# Patient Record
Sex: Female | Born: 1940
Health system: Southern US, Community
[De-identification: ages and names within clinical notes are randomized; demographics above are authoritative.]

## PROBLEM LIST (undated history)

## (undated) DIAGNOSIS — I10 Essential (primary) hypertension: Secondary | ICD-10-CM

## (undated) DIAGNOSIS — M1990 Unspecified osteoarthritis, unspecified site: Secondary | ICD-10-CM

## (undated) DIAGNOSIS — Z923 Personal history of irradiation: Secondary | ICD-10-CM

## (undated) DIAGNOSIS — C50919 Malignant neoplasm of unspecified site of unspecified female breast: Secondary | ICD-10-CM

## (undated) DIAGNOSIS — M8580 Other specified disorders of bone density and structure, unspecified site: Secondary | ICD-10-CM

## (undated) DIAGNOSIS — F419 Anxiety disorder, unspecified: Secondary | ICD-10-CM

## (undated) DIAGNOSIS — I4891 Unspecified atrial fibrillation: Secondary | ICD-10-CM

## (undated) DIAGNOSIS — E7800 Pure hypercholesterolemia, unspecified: Secondary | ICD-10-CM

## (undated) DIAGNOSIS — T7840XA Allergy, unspecified, initial encounter: Secondary | ICD-10-CM

## (undated) HISTORY — PX: OTHER SURGICAL HISTORY: SHX169

## (undated) HISTORY — DX: Malignant neoplasm of unspecified site of unspecified female breast: C50.919

## (undated) HISTORY — PX: TONSILLECTOMY: SUR1361

## (undated) HISTORY — PX: APPENDECTOMY: SHX54

## (undated) HISTORY — PX: CHOLECYSTECTOMY: SHX55

## (undated) HISTORY — DX: Unspecified atrial fibrillation: I48.91

---

## 1998-02-03 ENCOUNTER — Ambulatory Visit (HOSPITAL_COMMUNITY): Admission: RE | Admit: 1998-02-03 | Discharge: 1998-02-03 | Payer: Self-pay | Admitting: *Deleted

## 1999-02-06 ENCOUNTER — Ambulatory Visit (HOSPITAL_COMMUNITY): Admission: RE | Admit: 1999-02-06 | Discharge: 1999-02-06 | Payer: Self-pay | Admitting: *Deleted

## 1999-08-03 ENCOUNTER — Other Ambulatory Visit: Admission: RE | Admit: 1999-08-03 | Discharge: 1999-08-03 | Payer: Self-pay | Admitting: *Deleted

## 1999-08-04 ENCOUNTER — Encounter (INDEPENDENT_AMBULATORY_CARE_PROVIDER_SITE_OTHER): Payer: Self-pay | Admitting: Specialist

## 1999-08-04 ENCOUNTER — Other Ambulatory Visit: Admission: RE | Admit: 1999-08-04 | Discharge: 1999-08-04 | Payer: Self-pay | Admitting: *Deleted

## 2000-02-08 ENCOUNTER — Encounter: Admission: RE | Admit: 2000-02-08 | Discharge: 2000-02-08 | Payer: Self-pay | Admitting: *Deleted

## 2000-02-08 ENCOUNTER — Encounter: Payer: Self-pay | Admitting: *Deleted

## 2000-08-02 ENCOUNTER — Other Ambulatory Visit: Admission: RE | Admit: 2000-08-02 | Discharge: 2000-08-02 | Payer: Self-pay | Admitting: *Deleted

## 2001-02-07 ENCOUNTER — Ambulatory Visit (HOSPITAL_COMMUNITY): Admission: RE | Admit: 2001-02-07 | Discharge: 2001-02-07 | Payer: Self-pay | Admitting: Gastroenterology

## 2001-02-14 ENCOUNTER — Encounter: Payer: Self-pay | Admitting: *Deleted

## 2001-02-14 ENCOUNTER — Encounter: Admission: RE | Admit: 2001-02-14 | Discharge: 2001-02-14 | Payer: Self-pay | Admitting: *Deleted

## 2001-08-04 ENCOUNTER — Other Ambulatory Visit: Admission: RE | Admit: 2001-08-04 | Discharge: 2001-08-04 | Payer: Self-pay | Admitting: *Deleted

## 2001-08-08 ENCOUNTER — Encounter: Admission: RE | Admit: 2001-08-08 | Discharge: 2001-08-08 | Payer: Self-pay | Admitting: *Deleted

## 2001-08-08 ENCOUNTER — Encounter: Payer: Self-pay | Admitting: *Deleted

## 2002-02-16 ENCOUNTER — Encounter: Admission: RE | Admit: 2002-02-16 | Discharge: 2002-02-16 | Payer: Self-pay | Admitting: *Deleted

## 2002-02-16 ENCOUNTER — Encounter: Payer: Self-pay | Admitting: *Deleted

## 2002-08-06 ENCOUNTER — Other Ambulatory Visit: Admission: RE | Admit: 2002-08-06 | Discharge: 2002-08-06 | Payer: Self-pay | Admitting: Obstetrics and Gynecology

## 2002-11-30 ENCOUNTER — Encounter (HOSPITAL_BASED_OUTPATIENT_CLINIC_OR_DEPARTMENT_OTHER): Payer: Self-pay | Admitting: Internal Medicine

## 2002-11-30 ENCOUNTER — Ambulatory Visit (HOSPITAL_COMMUNITY): Admission: RE | Admit: 2002-11-30 | Discharge: 2002-11-30 | Payer: Self-pay | Admitting: Internal Medicine

## 2003-02-19 ENCOUNTER — Encounter: Payer: Self-pay | Admitting: Obstetrics and Gynecology

## 2003-02-19 ENCOUNTER — Encounter: Admission: RE | Admit: 2003-02-19 | Discharge: 2003-02-19 | Payer: Self-pay | Admitting: Obstetrics and Gynecology

## 2003-08-08 ENCOUNTER — Other Ambulatory Visit: Admission: RE | Admit: 2003-08-08 | Discharge: 2003-08-08 | Payer: Self-pay | Admitting: Obstetrics and Gynecology

## 2004-03-03 ENCOUNTER — Encounter: Admission: RE | Admit: 2004-03-03 | Discharge: 2004-03-03 | Payer: Self-pay | Admitting: Obstetrics and Gynecology

## 2005-03-23 ENCOUNTER — Encounter: Admission: RE | Admit: 2005-03-23 | Discharge: 2005-03-23 | Payer: Self-pay | Admitting: Obstetrics and Gynecology

## 2006-03-28 ENCOUNTER — Encounter: Admission: RE | Admit: 2006-03-28 | Discharge: 2006-03-28 | Payer: Self-pay | Admitting: Obstetrics and Gynecology

## 2007-03-30 ENCOUNTER — Encounter: Admission: RE | Admit: 2007-03-30 | Discharge: 2007-03-30 | Payer: Self-pay | Admitting: Obstetrics and Gynecology

## 2008-04-04 ENCOUNTER — Encounter: Admission: RE | Admit: 2008-04-04 | Discharge: 2008-04-04 | Payer: Self-pay | Admitting: Obstetrics and Gynecology

## 2008-11-01 ENCOUNTER — Ambulatory Visit (HOSPITAL_COMMUNITY): Admission: RE | Admit: 2008-11-01 | Discharge: 2008-11-01 | Payer: Self-pay | Admitting: Gastroenterology

## 2009-05-01 ENCOUNTER — Encounter: Admission: RE | Admit: 2009-05-01 | Discharge: 2009-05-01 | Payer: Self-pay | Admitting: Obstetrics and Gynecology

## 2010-05-14 ENCOUNTER — Encounter: Admission: RE | Admit: 2010-05-14 | Discharge: 2010-05-14 | Payer: Self-pay | Admitting: Obstetrics and Gynecology

## 2011-04-12 ENCOUNTER — Other Ambulatory Visit: Payer: Self-pay | Admitting: Obstetrics and Gynecology

## 2011-04-14 ENCOUNTER — Other Ambulatory Visit: Payer: Self-pay | Admitting: Obstetrics and Gynecology

## 2011-04-14 DIAGNOSIS — M8580 Other specified disorders of bone density and structure, unspecified site: Secondary | ICD-10-CM

## 2011-04-14 DIAGNOSIS — Z1231 Encounter for screening mammogram for malignant neoplasm of breast: Secondary | ICD-10-CM

## 2011-05-20 ENCOUNTER — Ambulatory Visit
Admission: RE | Admit: 2011-05-20 | Discharge: 2011-05-20 | Disposition: A | Discharge status: Home / Self Care | Payer: Medicare Other | Source: Ambulatory Visit | Attending: Obstetrics and Gynecology | Admitting: Obstetrics and Gynecology

## 2011-05-20 DIAGNOSIS — Z1231 Encounter for screening mammogram for malignant neoplasm of breast: Secondary | ICD-10-CM

## 2011-05-20 DIAGNOSIS — M8580 Other specified disorders of bone density and structure, unspecified site: Secondary | ICD-10-CM

## 2012-04-10 ENCOUNTER — Other Ambulatory Visit: Payer: Self-pay | Admitting: Obstetrics and Gynecology

## 2012-04-10 DIAGNOSIS — Z1231 Encounter for screening mammogram for malignant neoplasm of breast: Secondary | ICD-10-CM

## 2012-05-22 ENCOUNTER — Ambulatory Visit
Admission: RE | Admit: 2012-05-22 | Discharge: 2012-05-22 | Disposition: A | Discharge status: Home / Self Care | Payer: Medicare Other | Source: Ambulatory Visit | Attending: Obstetrics and Gynecology | Admitting: Obstetrics and Gynecology

## 2012-05-22 DIAGNOSIS — Z1231 Encounter for screening mammogram for malignant neoplasm of breast: Secondary | ICD-10-CM

## 2013-04-23 ENCOUNTER — Other Ambulatory Visit: Payer: Self-pay

## 2013-04-23 ENCOUNTER — Other Ambulatory Visit: Payer: Self-pay | Admitting: Obstetrics & Gynecology

## 2013-04-23 DIAGNOSIS — R29890 Loss of height: Secondary | ICD-10-CM

## 2013-04-23 DIAGNOSIS — Z1231 Encounter for screening mammogram for malignant neoplasm of breast: Secondary | ICD-10-CM

## 2013-04-23 DIAGNOSIS — M8580 Other specified disorders of bone density and structure, unspecified site: Secondary | ICD-10-CM

## 2013-05-23 ENCOUNTER — Ambulatory Visit: Payer: Medicare Other

## 2013-05-31 ENCOUNTER — Ambulatory Visit
Admission: RE | Admit: 2013-05-31 | Discharge: 2013-05-31 | Disposition: A | Discharge status: Home / Self Care | Payer: Medicare Other | Source: Ambulatory Visit | Attending: Obstetrics & Gynecology | Admitting: Obstetrics & Gynecology

## 2013-05-31 ENCOUNTER — Ambulatory Visit
Admission: RE | Admit: 2013-05-31 | Discharge: 2013-05-31 | Disposition: A | Discharge status: Home / Self Care | Payer: Medicare Other | Source: Ambulatory Visit

## 2013-05-31 DIAGNOSIS — Z1231 Encounter for screening mammogram for malignant neoplasm of breast: Secondary | ICD-10-CM

## 2013-05-31 DIAGNOSIS — M8580 Other specified disorders of bone density and structure, unspecified site: Secondary | ICD-10-CM

## 2013-05-31 DIAGNOSIS — R29890 Loss of height: Secondary | ICD-10-CM

## 2013-06-04 ENCOUNTER — Other Ambulatory Visit: Payer: Self-pay | Admitting: Obstetrics and Gynecology

## 2013-06-04 DIAGNOSIS — R928 Other abnormal and inconclusive findings on diagnostic imaging of breast: Secondary | ICD-10-CM

## 2013-06-21 ENCOUNTER — Ambulatory Visit
Admission: RE | Admit: 2013-06-21 | Discharge: 2013-06-21 | Disposition: A | Discharge status: Home / Self Care | Payer: Medicare Other | Source: Ambulatory Visit | Attending: Obstetrics and Gynecology | Admitting: Obstetrics and Gynecology

## 2013-06-21 DIAGNOSIS — R928 Other abnormal and inconclusive findings on diagnostic imaging of breast: Secondary | ICD-10-CM

## 2014-05-20 ENCOUNTER — Other Ambulatory Visit: Payer: Self-pay

## 2014-05-20 DIAGNOSIS — Z1231 Encounter for screening mammogram for malignant neoplasm of breast: Secondary | ICD-10-CM

## 2014-06-05 ENCOUNTER — Encounter (INDEPENDENT_AMBULATORY_CARE_PROVIDER_SITE_OTHER): Payer: Self-pay

## 2014-06-05 ENCOUNTER — Ambulatory Visit
Admission: RE | Admit: 2014-06-05 | Discharge: 2014-06-05 | Disposition: A | Discharge status: Home / Self Care | Payer: Commercial Managed Care - HMO | Source: Ambulatory Visit

## 2014-06-05 DIAGNOSIS — Z1231 Encounter for screening mammogram for malignant neoplasm of breast: Secondary | ICD-10-CM

## 2015-05-02 ENCOUNTER — Other Ambulatory Visit: Payer: Self-pay

## 2015-05-02 DIAGNOSIS — Z1231 Encounter for screening mammogram for malignant neoplasm of breast: Secondary | ICD-10-CM

## 2015-05-15 ENCOUNTER — Other Ambulatory Visit: Payer: Self-pay | Admitting: Obstetrics and Gynecology

## 2015-05-15 DIAGNOSIS — M8580 Other specified disorders of bone density and structure, unspecified site: Secondary | ICD-10-CM

## 2015-06-16 ENCOUNTER — Ambulatory Visit: Payer: Commercial Managed Care - HMO

## 2015-06-18 ENCOUNTER — Other Ambulatory Visit: Payer: Commercial Managed Care - HMO

## 2015-06-26 ENCOUNTER — Ambulatory Visit
Admission: RE | Admit: 2015-06-26 | Discharge: 2015-06-26 | Disposition: A | Discharge status: Home / Self Care | Payer: Commercial Managed Care - HMO | Source: Ambulatory Visit | Attending: Obstetrics and Gynecology | Admitting: Obstetrics and Gynecology

## 2015-06-26 ENCOUNTER — Ambulatory Visit
Admission: RE | Admit: 2015-06-26 | Discharge: 2015-06-26 | Disposition: A | Discharge status: Home / Self Care | Payer: Commercial Managed Care - HMO | Source: Ambulatory Visit

## 2015-06-26 DIAGNOSIS — Z1231 Encounter for screening mammogram for malignant neoplasm of breast: Secondary | ICD-10-CM

## 2015-06-26 DIAGNOSIS — M8580 Other specified disorders of bone density and structure, unspecified site: Secondary | ICD-10-CM

## 2015-08-28 DIAGNOSIS — H35371 Puckering of macula, right eye: Secondary | ICD-10-CM | POA: Diagnosis not present

## 2015-09-05 DIAGNOSIS — I10 Essential (primary) hypertension: Secondary | ICD-10-CM | POA: Diagnosis not present

## 2015-09-05 DIAGNOSIS — E784 Other hyperlipidemia: Secondary | ICD-10-CM | POA: Diagnosis not present

## 2015-09-05 DIAGNOSIS — E559 Vitamin D deficiency, unspecified: Secondary | ICD-10-CM | POA: Diagnosis not present

## 2015-09-15 DIAGNOSIS — Z6823 Body mass index (BMI) 23.0-23.9, adult: Secondary | ICD-10-CM | POA: Diagnosis not present

## 2015-09-15 DIAGNOSIS — L989 Disorder of the skin and subcutaneous tissue, unspecified: Secondary | ICD-10-CM | POA: Diagnosis not present

## 2015-09-15 DIAGNOSIS — F418 Other specified anxiety disorders: Secondary | ICD-10-CM | POA: Diagnosis not present

## 2015-09-15 DIAGNOSIS — Z1389 Encounter for screening for other disorder: Secondary | ICD-10-CM | POA: Diagnosis not present

## 2015-09-15 DIAGNOSIS — J45909 Unspecified asthma, uncomplicated: Secondary | ICD-10-CM | POA: Diagnosis not present

## 2015-09-15 DIAGNOSIS — E559 Vitamin D deficiency, unspecified: Secondary | ICD-10-CM | POA: Diagnosis not present

## 2015-09-15 DIAGNOSIS — M859 Disorder of bone density and structure, unspecified: Secondary | ICD-10-CM | POA: Diagnosis not present

## 2015-09-15 DIAGNOSIS — I10 Essential (primary) hypertension: Secondary | ICD-10-CM | POA: Diagnosis not present

## 2015-09-15 DIAGNOSIS — Z0000 Encounter for general adult medical examination without abnormal findings: Secondary | ICD-10-CM | POA: Diagnosis not present

## 2015-09-18 DIAGNOSIS — Z1212 Encounter for screening for malignant neoplasm of rectum: Secondary | ICD-10-CM | POA: Diagnosis not present

## 2015-10-02 DIAGNOSIS — L821 Other seborrheic keratosis: Secondary | ICD-10-CM | POA: Diagnosis not present

## 2015-10-02 DIAGNOSIS — D485 Neoplasm of uncertain behavior of skin: Secondary | ICD-10-CM | POA: Diagnosis not present

## 2016-06-28 DIAGNOSIS — Z01419 Encounter for gynecological examination (general) (routine) without abnormal findings: Secondary | ICD-10-CM | POA: Diagnosis not present

## 2016-06-28 DIAGNOSIS — Z1231 Encounter for screening mammogram for malignant neoplasm of breast: Secondary | ICD-10-CM | POA: Diagnosis not present

## 2016-08-30 DIAGNOSIS — H1045 Other chronic allergic conjunctivitis: Secondary | ICD-10-CM | POA: Diagnosis not present

## 2016-08-30 DIAGNOSIS — H35371 Puckering of macula, right eye: Secondary | ICD-10-CM | POA: Diagnosis not present

## 2016-09-01 DIAGNOSIS — J45909 Unspecified asthma, uncomplicated: Secondary | ICD-10-CM | POA: Diagnosis not present

## 2016-09-01 DIAGNOSIS — H9191 Unspecified hearing loss, right ear: Secondary | ICD-10-CM | POA: Diagnosis not present

## 2016-09-01 DIAGNOSIS — Z6823 Body mass index (BMI) 23.0-23.9, adult: Secondary | ICD-10-CM | POA: Diagnosis not present

## 2016-09-01 DIAGNOSIS — I10 Essential (primary) hypertension: Secondary | ICD-10-CM | POA: Diagnosis not present

## 2016-09-01 DIAGNOSIS — H6121 Impacted cerumen, right ear: Secondary | ICD-10-CM | POA: Diagnosis not present

## 2016-09-20 DIAGNOSIS — E784 Other hyperlipidemia: Secondary | ICD-10-CM | POA: Diagnosis not present

## 2016-09-20 DIAGNOSIS — E559 Vitamin D deficiency, unspecified: Secondary | ICD-10-CM | POA: Diagnosis not present

## 2016-09-20 DIAGNOSIS — I10 Essential (primary) hypertension: Secondary | ICD-10-CM | POA: Diagnosis not present

## 2016-10-04 DIAGNOSIS — E559 Vitamin D deficiency, unspecified: Secondary | ICD-10-CM | POA: Diagnosis not present

## 2016-10-04 DIAGNOSIS — Z1389 Encounter for screening for other disorder: Secondary | ICD-10-CM | POA: Diagnosis not present

## 2016-10-04 DIAGNOSIS — E784 Other hyperlipidemia: Secondary | ICD-10-CM | POA: Diagnosis not present

## 2016-10-04 DIAGNOSIS — I10 Essential (primary) hypertension: Secondary | ICD-10-CM | POA: Diagnosis not present

## 2016-10-04 DIAGNOSIS — F418 Other specified anxiety disorders: Secondary | ICD-10-CM | POA: Diagnosis not present

## 2016-10-04 DIAGNOSIS — G4709 Other insomnia: Secondary | ICD-10-CM | POA: Diagnosis not present

## 2016-10-04 DIAGNOSIS — Z0000 Encounter for general adult medical examination without abnormal findings: Secondary | ICD-10-CM | POA: Diagnosis not present

## 2016-10-04 DIAGNOSIS — Z6823 Body mass index (BMI) 23.0-23.9, adult: Secondary | ICD-10-CM | POA: Diagnosis not present

## 2016-10-04 DIAGNOSIS — J45998 Other asthma: Secondary | ICD-10-CM | POA: Diagnosis not present

## 2016-10-04 DIAGNOSIS — M859 Disorder of bone density and structure, unspecified: Secondary | ICD-10-CM | POA: Diagnosis not present

## 2016-10-05 DIAGNOSIS — Z1212 Encounter for screening for malignant neoplasm of rectum: Secondary | ICD-10-CM | POA: Diagnosis not present

## 2017-02-28 DIAGNOSIS — H35371 Puckering of macula, right eye: Secondary | ICD-10-CM | POA: Diagnosis not present

## 2017-05-13 DIAGNOSIS — Z800 Family history of malignant neoplasm of digestive organs: Secondary | ICD-10-CM | POA: Diagnosis not present

## 2017-05-13 DIAGNOSIS — K5730 Diverticulosis of large intestine without perforation or abscess without bleeding: Secondary | ICD-10-CM | POA: Diagnosis not present

## 2017-05-13 DIAGNOSIS — Z1211 Encounter for screening for malignant neoplasm of colon: Secondary | ICD-10-CM | POA: Diagnosis not present

## 2017-06-30 DIAGNOSIS — M8580 Other specified disorders of bone density and structure, unspecified site: Secondary | ICD-10-CM | POA: Diagnosis not present

## 2017-06-30 DIAGNOSIS — Z124 Encounter for screening for malignant neoplasm of cervix: Secondary | ICD-10-CM | POA: Diagnosis not present

## 2017-06-30 DIAGNOSIS — Z1231 Encounter for screening mammogram for malignant neoplasm of breast: Secondary | ICD-10-CM | POA: Diagnosis not present

## 2017-09-05 DIAGNOSIS — H524 Presbyopia: Secondary | ICD-10-CM | POA: Diagnosis not present

## 2017-09-05 DIAGNOSIS — H04123 Dry eye syndrome of bilateral lacrimal glands: Secondary | ICD-10-CM | POA: Diagnosis not present

## 2017-09-05 DIAGNOSIS — Z961 Presence of intraocular lens: Secondary | ICD-10-CM | POA: Diagnosis not present

## 2017-10-03 DIAGNOSIS — R82998 Other abnormal findings in urine: Secondary | ICD-10-CM | POA: Diagnosis not present

## 2017-10-03 DIAGNOSIS — E559 Vitamin D deficiency, unspecified: Secondary | ICD-10-CM | POA: Diagnosis not present

## 2017-10-03 DIAGNOSIS — E7849 Other hyperlipidemia: Secondary | ICD-10-CM | POA: Diagnosis not present

## 2017-10-03 DIAGNOSIS — I10 Essential (primary) hypertension: Secondary | ICD-10-CM | POA: Diagnosis not present

## 2017-10-10 DIAGNOSIS — E7849 Other hyperlipidemia: Secondary | ICD-10-CM | POA: Diagnosis not present

## 2017-10-10 DIAGNOSIS — Z0000 Encounter for general adult medical examination without abnormal findings: Secondary | ICD-10-CM | POA: Diagnosis not present

## 2017-10-10 DIAGNOSIS — I10 Essential (primary) hypertension: Secondary | ICD-10-CM | POA: Diagnosis not present

## 2017-10-10 DIAGNOSIS — F418 Other specified anxiety disorders: Secondary | ICD-10-CM | POA: Diagnosis not present

## 2017-10-10 DIAGNOSIS — Z1389 Encounter for screening for other disorder: Secondary | ICD-10-CM | POA: Diagnosis not present

## 2017-10-10 DIAGNOSIS — M859 Disorder of bone density and structure, unspecified: Secondary | ICD-10-CM | POA: Diagnosis not present

## 2017-10-10 DIAGNOSIS — Z6822 Body mass index (BMI) 22.0-22.9, adult: Secondary | ICD-10-CM | POA: Diagnosis not present

## 2017-10-10 DIAGNOSIS — E559 Vitamin D deficiency, unspecified: Secondary | ICD-10-CM | POA: Diagnosis not present

## 2017-10-10 DIAGNOSIS — J45998 Other asthma: Secondary | ICD-10-CM | POA: Diagnosis not present

## 2017-10-10 DIAGNOSIS — L988 Other specified disorders of the skin and subcutaneous tissue: Secondary | ICD-10-CM | POA: Diagnosis not present

## 2017-10-14 DIAGNOSIS — Z1212 Encounter for screening for malignant neoplasm of rectum: Secondary | ICD-10-CM | POA: Diagnosis not present

## 2017-11-10 DIAGNOSIS — L570 Actinic keratosis: Secondary | ICD-10-CM | POA: Diagnosis not present

## 2017-11-10 DIAGNOSIS — C44311 Basal cell carcinoma of skin of nose: Secondary | ICD-10-CM | POA: Diagnosis not present

## 2017-11-10 DIAGNOSIS — C44319 Basal cell carcinoma of skin of other parts of face: Secondary | ICD-10-CM | POA: Diagnosis not present

## 2017-11-10 DIAGNOSIS — D485 Neoplasm of uncertain behavior of skin: Secondary | ICD-10-CM | POA: Diagnosis not present

## 2017-11-21 DIAGNOSIS — C4401 Basal cell carcinoma of skin of lip: Secondary | ICD-10-CM | POA: Diagnosis not present

## 2017-11-21 DIAGNOSIS — Z85828 Personal history of other malignant neoplasm of skin: Secondary | ICD-10-CM | POA: Diagnosis not present

## 2018-02-24 DIAGNOSIS — Z85828 Personal history of other malignant neoplasm of skin: Secondary | ICD-10-CM | POA: Diagnosis not present

## 2018-02-24 DIAGNOSIS — L720 Epidermal cyst: Secondary | ICD-10-CM | POA: Diagnosis not present

## 2018-04-07 ENCOUNTER — Other Ambulatory Visit: Payer: Self-pay | Admitting: Pharmacist

## 2018-04-07 NOTE — Patient Outreach (Signed)
[place]) Care Management  04/07/2018  KENZI BARDWELL 1940-08-17 [id number]   Call patient's PCP office to confirm that provider is aware that patient is taking and wants patient to continue to take a total of 10 mg of atorvastatin once daily. If so, request that provider send an updated prescription reflecting this dosing into the patient's pharmacy. Leave a message for the provider.  Harlow Asa, PharmD, [place] Management [phone]

## 2018-04-07 NOTE — Patient Outreach (Signed)
[place] [facility]) Care Management  04/07/2018  Kelly Blackwell Apr 12, 1941 [id number]   Incoming call from [name] in response to the [facility] Medication Adherence Campaign. Speak with patient. HIPAA identifiers verified and verbal consent received.  [name] reports that she takes her atorvastatin 20 mg - 1/2 tablet by mouth daily as directed. Patient notes that her prescription from her PCP is written for her to take a full tablet once daily, but states that her provider has instructed her to take just 1/2 tablet daily. Patient is not aware that a 10 mg strength exists. Let patient know that I will call her PCP to request that he update this prescription to accurately reflect the dosing that she is taking and so that she no longer needs to split tablets. Patient expresses appreciation for my assistance.  PLAN  1) Will call patient's PCP office to confirm that provider is aware that patient is taking and wants patient to continue to take a total of 10 mg of atorvastatin once daily. If so, will request that provider send an updated prescription reflecting this dosing into the patient's pharmacy.  Harlow Asa, PharmD, [place] Management [phone]

## 2018-04-12 ENCOUNTER — Other Ambulatory Visit: Payer: Self-pay | Admitting: Pharmacist

## 2018-04-12 NOTE — Patient Outreach (Signed)
[place] [facility]) Care Management  04/12/2018  Kelly Blackwell 1940/10/18 [id number]   Perform Epic chart review and note that per the Medication Dispense Report, a new prescription reflecting the dosing of atorvastatin 10 mg has been called into the patient's local [facility] on 04/07/18. Will close pharmacy episode at this time.  Harlow Asa, PharmD, [place] Management [phone]

## 2018-05-31 DIAGNOSIS — D225 Melanocytic nevi of trunk: Secondary | ICD-10-CM | POA: Diagnosis not present

## 2018-05-31 DIAGNOSIS — L918 Other hypertrophic disorders of the skin: Secondary | ICD-10-CM | POA: Diagnosis not present

## 2018-05-31 DIAGNOSIS — Z85828 Personal history of other malignant neoplasm of skin: Secondary | ICD-10-CM | POA: Diagnosis not present

## 2018-05-31 DIAGNOSIS — D1801 Hemangioma of skin and subcutaneous tissue: Secondary | ICD-10-CM | POA: Diagnosis not present

## 2018-05-31 DIAGNOSIS — L814 Other melanin hyperpigmentation: Secondary | ICD-10-CM | POA: Diagnosis not present

## 2018-05-31 DIAGNOSIS — L821 Other seborrheic keratosis: Secondary | ICD-10-CM | POA: Diagnosis not present

## 2018-07-03 ENCOUNTER — Other Ambulatory Visit: Payer: Self-pay | Admitting: Obstetrics and Gynecology

## 2018-07-03 DIAGNOSIS — Z124 Encounter for screening for malignant neoplasm of cervix: Secondary | ICD-10-CM | POA: Diagnosis not present

## 2018-07-03 DIAGNOSIS — N6489 Other specified disorders of breast: Secondary | ICD-10-CM

## 2018-07-03 DIAGNOSIS — Z1231 Encounter for screening mammogram for malignant neoplasm of breast: Secondary | ICD-10-CM | POA: Diagnosis not present

## 2018-07-03 DIAGNOSIS — Z01419 Encounter for gynecological examination (general) (routine) without abnormal findings: Secondary | ICD-10-CM | POA: Diagnosis not present

## 2018-07-13 ENCOUNTER — Ambulatory Visit
Admission: RE | Admit: 2018-07-13 | Discharge: 2018-07-13 | Disposition: A | Discharge status: Home / Self Care | Payer: PPO | Source: Ambulatory Visit | Attending: Obstetrics and Gynecology | Admitting: Obstetrics and Gynecology

## 2018-07-13 ENCOUNTER — Other Ambulatory Visit: Payer: Self-pay | Admitting: Obstetrics and Gynecology

## 2018-07-13 ENCOUNTER — Ambulatory Visit
Admission: RE | Admit: 2018-07-13 | Discharge: 2018-07-13 | Disposition: A | Discharge status: Home / Self Care | Payer: Commercial Managed Care - HMO | Source: Ambulatory Visit | Attending: Obstetrics and Gynecology | Admitting: Obstetrics and Gynecology

## 2018-07-13 DIAGNOSIS — M8580 Other specified disorders of bone density and structure, unspecified site: Secondary | ICD-10-CM

## 2018-07-13 DIAGNOSIS — N6489 Other specified disorders of breast: Secondary | ICD-10-CM

## 2018-07-13 DIAGNOSIS — N6310 Unspecified lump in the right breast, unspecified quadrant: Secondary | ICD-10-CM | POA: Diagnosis not present

## 2018-07-13 DIAGNOSIS — R922 Inconclusive mammogram: Secondary | ICD-10-CM | POA: Diagnosis not present

## 2018-07-17 ENCOUNTER — Ambulatory Visit
Admission: RE | Admit: 2018-07-17 | Discharge: 2018-07-17 | Disposition: A | Discharge status: Home / Self Care | Payer: PPO | Source: Ambulatory Visit | Attending: Obstetrics and Gynecology | Admitting: Obstetrics and Gynecology

## 2018-07-17 DIAGNOSIS — C50211 Malignant neoplasm of upper-inner quadrant of right female breast: Secondary | ICD-10-CM | POA: Diagnosis not present

## 2018-07-17 DIAGNOSIS — N6310 Unspecified lump in the right breast, unspecified quadrant: Secondary | ICD-10-CM

## 2018-07-17 DIAGNOSIS — N6312 Unspecified lump in the right breast, upper inner quadrant: Secondary | ICD-10-CM | POA: Diagnosis not present

## 2018-07-24 ENCOUNTER — Ambulatory Visit: Payer: Self-pay | Admitting: Surgery

## 2018-07-24 DIAGNOSIS — C50911 Malignant neoplasm of unspecified site of right female breast: Secondary | ICD-10-CM

## 2018-07-24 NOTE — H&P (Signed)
History of Present Illness Kelly Blackwell. Kelly Schank MD; 07/24/2018 12:27 PM) The patient is a 78 year old female who presents with breast cancer. PCP - [place] - NP  Reason for referral - right invasive ductal carcinoma  This is a healthy 78 year old female who presents with a recent abnormal mammogram. She underwent diagnostic mammogram and ultrasound of the right upper inner quadrant followed by a biopsy. The biopsy confirmed invasive mammary carcinoma ER/PR positive, HER-2 equivocal (FISH pending), Ki67 - 5%. Ultrasound showed no enlarged lymph nodes. The mass is nonpalpable. The patient is referred to [place] for her surgical evaluation.  Prior to screening mammogram she had no problems with her breasts. No pain, no nipple discharge. The patient has had no previous breast biopsies.  Menarche age 15 next First pregnancy age 72 Breast-feed no Oral contraceptives and hormone replacement therapy for over 15 years Menopause around age 30 Family history is positive only for breast cancer in 2 maternal aunts  CLINICAL DATA: Screening recall for right breast distortion.  EXAM: DIGITAL DIAGNOSTIC UNILATERAL RIGHT MAMMOGRAM WITH CAD AND TOMO  RIGHT BREAST ULTRASOUND  COMPARISON: Previous exam(s).  ACR Breast Density Category c: The breast tissue is heterogeneously dense, which may obscure small masses.  FINDINGS: Spot compression views of the right breast were performed. There is a persistent area of distortion within the upper to slightly inner right breast.  Mammographic images were processed with CAD.  Physical examination of the superior right breast does not reveal any palpable masses.  Targeted ultrasound of the right breast was performed. There is an irregular hypoechoic mass at the 1 o'clock position 3 cm from nipple measuring 1.1 x 0.6 x 1.1 cm. This corresponds well with mammography findings. No lymphadenopathy seen in the right  axilla.  IMPRESSION: Suspicious right breast mass/distortion.  RECOMMENDATION: Ultrasound-guided biopsy of the mass in the right breast at the 1 o'clock position is recommended.  I have discussed the findings and recommendations with the patient. Results were also provided in writing at the conclusion of the visit. If applicable, a reminder letter will be sent to the patient regarding the next appointment.  BI-RADS CATEGORY 4: Suspicious.   Electronically Signed By: Kelly Blackwell M.D. On: 07/13/2018 13:22  CLINICAL DATA: Right breast 1 o'clock mass.  EXAM: ULTRASOUND GUIDED RIGHT BREAST CORE NEEDLE BIOPSY  Scratch  COMPARISON: Previous exam(s).  FINDINGS: I met with the patient and we discussed the procedure of ultrasound-guided biopsy, including benefits and alternatives. We discussed the high likelihood of a successful procedure. We discussed the risks of the procedure, including infection, bleeding, tissue injury, clip migration, and inadequate sampling. Informed written consent was given. The usual time-out protocol was performed immediately prior to the procedure.  Lesion quadrant: Upper inner quadrant  Using sterile technique and 1% Lidocaine as local anesthetic, under direct ultrasound visualization, a 14 gauge spring-loaded device was used to perform biopsy of right breast 1 o'clock mass using a inferior approach. At the conclusion of the procedure a ribbon shaped tissue marker clip was deployed into the biopsy cavity. Follow up 2 view mammogram was performed and dictated separately.  IMPRESSION: Ultrasound guided biopsy of right breast. No apparent complications.  Electronically Signed: By: Kelly Blackwell M.D. On: 07/17/2018 16:29   CLINICAL DATA: Post ultrasound-guided core needle biopsy of right breast 1 o'clock mass  EXAM: DIAGNOSTIC RIGHT MAMMOGRAM POST ULTRASOUND BIOPSY  COMPARISON: Previous exam(s).  FINDINGS: Mammographic  images were obtained following ultrasound guided biopsy of right breast 1  o'clock mass. Two-view mammography demonstrates presence of ribbon shaped marker in the right breast, within the area of architectural distortion demonstrated mammographically. The marker is located 4 mm anterior to the geometric center of the distortion.  IMPRESSION: Successful placement of ribbon shaped marker post ultrasound-guided core needle biopsy of right breast 1 o'clock mass.  Final Assessment: Post Procedure Mammograms for Marker Placement   Electronically Signed By: Kelly Blackwell M.D. On: 07/17/2018 16:31    Past Surgical History Kelly Blackwell, CMA; 07/24/2018 11:18 AM) Appendectomy Breast Biopsy Right. Cataract Surgery Bilateral. Gallbladder Surgery - Open  Diagnostic Studies History Kelly Blackwell, Kelly Blackwell; 07/24/2018 11:18 AM) Colonoscopy within last year Mammogram within last year  Allergies Kelly Blackwell, CMA; 07/24/2018 11:20 AM) Sulfa Antibiotics Rash. Allergies Reconciled  Medication History Kelly Blackwell, CMA; 07/24/2018 11:29 AM) ALPRAZolam (0.25MG Tablet, Oral) Active. Atorvastatin Calcium (10MG Tablet, Oral) Active. Meloxicam (15MG Tablet, Oral) Active. Telmisartan (80MG Tablet, Oral) Active. CoQ10 (10MG Capsule, Oral) Active. Vitamin D (2000UNIT Capsule, Oral) Active. Medications Reconciled Aspirin (81MG Tablet DR, Oral) Active.  Social History Kelly Blackwell, [place]; 07/24/2018 11:18 AM) Alcohol use Occasional alcohol use. Caffeine use Carbonated beverages, Coffee, Tea. No drug use Tobacco use Never smoker.  Family History Kelly Blackwell, [place]; 07/24/2018 11:18 AM) Breast Cancer Family Members In General. Colon Cancer Father. Colon Polyps Brother. Depression Mother. Hypertension Brother. Ischemic Bowel Disease Mother.  Pregnancy / Birth History Kelly Blackwell, [place]; 07/24/2018 11:18 AM) Age at menarche 89 years. Age of menopause  44-50 Contraceptive History Oral contraceptives. Gravida 3 Maternal age 30-25 Para 3  Other Problems Kelly Blackwell, Kelly Blackwell; 07/24/2018 11:18 AM) Anxiety Disorder Arthritis Breast Cancer Cholelithiasis Gastroesophageal Reflux Disease High blood pressure Hypercholesterolemia Lump In Breast     Review of Systems Kelly Blackwell CMA; 07/24/2018 11:18 AM) General Not Present- Appetite Loss, Chills, Fatigue, Fever, Night Sweats, Weight Gain and Weight Loss. Skin Not Present- Change in Wart/Mole, Dryness, Hives, Jaundice, New Lesions, Non-Healing Wounds, Rash and Ulcer. HEENT Present- Seasonal Allergies. Not Present- Earache, Hearing Loss, Hoarseness, Nose Bleed, Oral Ulcers, Ringing in the Ears, Sinus Pain, Sore Throat, Visual Disturbances, Wears glasses/contact lenses and Yellow Eyes. Breast Present- Breast Mass. Not Present- Breast Pain, Nipple Discharge and Skin Changes. Cardiovascular Not Present- Chest Pain, Difficulty Breathing Lying Down, Leg Cramps, Palpitations, Rapid Heart Rate, Shortness of Breath and Swelling of Extremities. Gastrointestinal Not Present- Abdominal Pain, Bloating, Bloody Stool, Change in Bowel Habits, Chronic diarrhea, Constipation, Difficulty Swallowing, Excessive gas, Gets full quickly at meals, Hemorrhoids, Indigestion, Nausea, Rectal Pain and Vomiting. Female Genitourinary Not Present- Frequency, Nocturia, Painful Urination, Pelvic Pain and Urgency. Musculoskeletal Not Present- Back Pain, Joint Pain, Joint Stiffness, Muscle Pain, Muscle Weakness and Swelling of Extremities. Neurological Not Present- Decreased Memory, Fainting, Headaches, Numbness, Seizures, Tingling, Tremor, Trouble walking and Weakness. Psychiatric Present- Anxiety. Not Present- Bipolar, Change in Sleep Pattern, Depression, Fearful and Frequent crying. Endocrine Not Present- Cold Intolerance, Excessive Hunger, Hair Changes, Heat Intolerance, Hot flashes and New Diabetes. Hematology  Not Present- Blood Thinners, Easy Bruising, Excessive bleeding, Gland problems, HIV and Persistent Infections.  Vitals Kelly Blackwell CMA; 07/24/2018 11:20 AM) 07/24/2018 11:19 AM Weight: 124 lb Height: 61.5in Body Surface Area: 1.55 m Body Mass Index: 23.05 kg/m  Temp.: 65F  Pulse: 85 (Regular)  BP: 132/84 (Sitting, Left Arm, Standard)      Physical Exam Kelly Key K. Pattie Flaharty MD; 07/24/2018 12:29 PM)  The physical exam findings are as follows: Note:WDWN in NAD Eyes: Pupils equal, round; sclera anicteric HENT: Oral mucosa moist; good dentition Neck:  No masses palpated, no thyromegaly Lungs: CTA bilaterally; normal respiratory effort Breasts: symmetric, no nipple retraction or discharge; no dominant masses or axillary lymphadenopathy on either side CV: Regular rate and rhythm; no murmurs; extremities well-perfused with no edema Abd: +bowel sounds, soft, non-tender, no palpable organomegaly; no palpable hernias Skin: Warm, dry; no sign of jaundice Psychiatric - alert and oriented x 4; calm mood and affect    Assessment & Plan Kelly Key K. Milen Lengacher MD; 07/24/2018 12:08 PM)  INVASIVE DUCTAL CARCINOMA OF RIGHT BREAST IN FEMALE (C50.911)  Current Plans Schedule for Surgery - Right radioactive seed localized lumpectomy/ right axillary sentinel lymph node biopsy/ blue dye injection. The surgical procedure has been discussed with the patient. Potential risks, benefits, alternative treatments, and expected outcomes have been explained. All of the patient's questions at this time have been answered. The likelihood of reaching the patient's treatment goal is good. The patient understand the proposed surgical procedure and wishes to proceed.  Kelly Blackwell. Kelly Dover, MD, [facility] Surgery  General/ Trauma Surgery Beeper [phone]  07/24/2018 12:29 PM

## 2018-07-24 NOTE — H&P (View-Only) (Signed)
History of Present Illness Kelly Blackwell. Carling Liberman MD; 07/24/2018 12:27 PM) The patient is a 78 year old female who presents with breast cancer. PCP - [place] - NP  Reason for referral - right invasive ductal carcinoma  This is a healthy 78 year old female who presents with a recent abnormal mammogram. She underwent diagnostic mammogram and ultrasound of the right upper inner quadrant followed by a biopsy. The biopsy confirmed invasive mammary carcinoma ER/PR positive, HER-2 equivocal (FISH pending), Ki67 - 5%. Ultrasound showed no enlarged lymph nodes. The mass is nonpalpable. The patient is referred to [place] for her surgical evaluation.  Prior to screening mammogram she had no problems with her breasts. No pain, no nipple discharge. The patient has had no previous breast biopsies.  Menarche age 3 next First pregnancy age 81 Breast-feed no Oral contraceptives and hormone replacement therapy for over 15 years Menopause around age 78 Family history is positive only for breast cancer in 2 maternal aunts  CLINICAL DATA: Screening recall for right breast distortion.  EXAM: DIGITAL DIAGNOSTIC UNILATERAL RIGHT MAMMOGRAM WITH CAD AND TOMO  RIGHT BREAST ULTRASOUND  COMPARISON: Previous exam(s).  ACR Breast Density Category c: The breast tissue is heterogeneously dense, which may obscure small masses.  FINDINGS: Spot compression views of the right breast were performed. There is a persistent area of distortion within the upper to slightly inner right breast.  Mammographic images were processed with CAD.  Physical examination of the superior right breast does not reveal any palpable masses.  Targeted ultrasound of the right breast was performed. There is an irregular hypoechoic mass at the 1 o'clock position 3 cm from nipple measuring 1.1 x 0.6 x 1.1 cm. This corresponds well with mammography findings. No lymphadenopathy seen in the right  axilla.  IMPRESSION: Suspicious right breast mass/distortion.  RECOMMENDATION: Ultrasound-guided biopsy of the mass in the right breast at the 1 o'clock position is recommended.  I have discussed the findings and recommendations with the patient. Results were also provided in writing at the conclusion of the visit. If applicable, a reminder letter will be sent to the patient regarding the next appointment.  BI-RADS CATEGORY 4: Suspicious.   Electronically Signed By: Everlean Alstrom M.D. On: 07/13/2018 13:22  CLINICAL DATA: Right breast 1 o'clock mass.  EXAM: ULTRASOUND GUIDED RIGHT BREAST CORE NEEDLE BIOPSY  Scratch  COMPARISON: Previous exam(s).  FINDINGS: I met with the patient and we discussed the procedure of ultrasound-guided biopsy, including benefits and alternatives. We discussed the high likelihood of a successful procedure. We discussed the risks of the procedure, including infection, bleeding, tissue injury, clip migration, and inadequate sampling. Informed written consent was given. The usual time-out protocol was performed immediately prior to the procedure.  Lesion quadrant: Upper inner quadrant  Using sterile technique and 1% Lidocaine as local anesthetic, under direct ultrasound visualization, a 14 gauge spring-loaded device was used to perform biopsy of right breast 1 o'clock mass using a inferior approach. At the conclusion of the procedure a ribbon shaped tissue marker clip was deployed into the biopsy cavity. Follow up 2 view mammogram was performed and dictated separately.  IMPRESSION: Ultrasound guided biopsy of right breast. No apparent complications.  Electronically Signed: By: Fidela Salisbury M.D. On: 07/17/2018 16:29   CLINICAL DATA: Post ultrasound-guided core needle biopsy of right breast 1 o'clock mass  EXAM: DIAGNOSTIC RIGHT MAMMOGRAM POST ULTRASOUND BIOPSY  COMPARISON: Previous exam(s).  FINDINGS: Mammographic  images were obtained following ultrasound guided biopsy of right breast 1  o'clock mass. Two-view mammography demonstrates presence of ribbon shaped marker in the right breast, within the area of architectural distortion demonstrated mammographically. The marker is located 4 mm anterior to the geometric center of the distortion.  IMPRESSION: Successful placement of ribbon shaped marker post ultrasound-guided core needle biopsy of right breast 1 o'clock mass.  Final Assessment: Post Procedure Mammograms for Marker Placement   Electronically Signed By: Fidela Salisbury M.D. On: 07/17/2018 16:31    Past Surgical History Emeline Gins, CMA; 07/24/2018 11:18 AM) Appendectomy Breast Biopsy Right. Cataract Surgery Bilateral. Gallbladder Surgery - Open  Diagnostic Studies History Emeline Gins, [place]; 07/24/2018 11:18 AM) Colonoscopy within last year Mammogram within last year  Allergies Emeline Gins, CMA; 07/24/2018 11:20 AM) Sulfa Antibiotics Rash. Allergies Reconciled  Medication History Emeline Gins, CMA; 07/24/2018 11:29 AM) ALPRAZolam (0.25MG Tablet, Oral) Active. Atorvastatin Calcium (10MG Tablet, Oral) Active. Meloxicam (15MG Tablet, Oral) Active. Telmisartan (80MG Tablet, Oral) Active. CoQ10 (10MG Capsule, Oral) Active. Vitamin D (2000UNIT Capsule, Oral) Active. Medications Reconciled Aspirin (81MG Tablet DR, Oral) Active.  Social History Emeline Gins, [place]; 07/24/2018 11:18 AM) Alcohol use Occasional alcohol use. Caffeine use Carbonated beverages, Coffee, Tea. No drug use Tobacco use Never smoker.  Family History Emeline Gins, [place]; 07/24/2018 11:18 AM) Breast Cancer Family Members In General. Colon Cancer Father. Colon Polyps Brother. Depression Mother. Hypertension Brother. Ischemic Bowel Disease Mother.  Pregnancy / Birth History Emeline Gins, [place]; 07/24/2018 11:18 AM) Age at menarche 50 years. Age of menopause  14-50 Contraceptive History Oral contraceptives. Gravida 3 Maternal age 42-25 Para 3  Other Problems Emeline Gins, [place]; 07/24/2018 11:18 AM) Anxiety Disorder Arthritis Breast Cancer Cholelithiasis Gastroesophageal Reflux Disease High blood pressure Hypercholesterolemia Lump In Breast     Review of Systems Emeline Gins CMA; 07/24/2018 11:18 AM) General Not Present- Appetite Loss, Chills, Fatigue, Fever, Night Sweats, Weight Gain and Weight Loss. Skin Not Present- Change in Wart/Mole, Dryness, Hives, Jaundice, New Lesions, Non-Healing Wounds, Rash and Ulcer. HEENT Present- Seasonal Allergies. Not Present- Earache, Hearing Loss, Hoarseness, Nose Bleed, Oral Ulcers, Ringing in the Ears, Sinus Pain, Sore Throat, Visual Disturbances, Wears glasses/contact lenses and Yellow Eyes. Breast Present- Breast Mass. Not Present- Breast Pain, Nipple Discharge and Skin Changes. Cardiovascular Not Present- Chest Pain, Difficulty Breathing Lying Down, Leg Cramps, Palpitations, Rapid Heart Rate, Shortness of Breath and Swelling of Extremities. Gastrointestinal Not Present- Abdominal Pain, Bloating, Bloody Stool, Change in Bowel Habits, Chronic diarrhea, Constipation, Difficulty Swallowing, Excessive gas, Gets full quickly at meals, Hemorrhoids, Indigestion, Nausea, Rectal Pain and Vomiting. Female Genitourinary Not Present- Frequency, Nocturia, Painful Urination, Pelvic Pain and Urgency. Musculoskeletal Not Present- Back Pain, Joint Pain, Joint Stiffness, Muscle Pain, Muscle Weakness and Swelling of Extremities. Neurological Not Present- Decreased Memory, Fainting, Headaches, Numbness, Seizures, Tingling, Tremor, Trouble walking and Weakness. Psychiatric Present- Anxiety. Not Present- Bipolar, Change in Sleep Pattern, Depression, Fearful and Frequent crying. Endocrine Not Present- Cold Intolerance, Excessive Hunger, Hair Changes, Heat Intolerance, Hot flashes and New Diabetes. Hematology  Not Present- Blood Thinners, Easy Bruising, Excessive bleeding, Gland problems, HIV and Persistent Infections.  Vitals Emeline Gins CMA; 07/24/2018 11:20 AM) 07/24/2018 11:19 AM Weight: 124 lb Height: 61.5in Body Surface Area: 1.55 m Body Mass Index: 23.05 kg/m  Temp.: 44F  Pulse: 85 (Regular)  BP: 132/84 (Sitting, Left Arm, Standard)      Physical Exam Rodman Key K. France Lusty MD; 07/24/2018 12:29 PM)  The physical exam findings are as follows: Note:WDWN in NAD Eyes: Pupils equal, round; sclera anicteric HENT: Oral mucosa moist; good dentition Neck:  No masses palpated, no thyromegaly Lungs: CTA bilaterally; normal respiratory effort Breasts: symmetric, no nipple retraction or discharge; no dominant masses or axillary lymphadenopathy on either side CV: Regular rate and rhythm; no murmurs; extremities well-perfused with no edema Abd: +bowel sounds, soft, non-tender, no palpable organomegaly; no palpable hernias Skin: Warm, dry; no sign of jaundice Psychiatric - alert and oriented x 4; calm mood and affect    Assessment & Plan Rodman Key K. Keats Kingry MD; 07/24/2018 12:08 PM)  INVASIVE DUCTAL CARCINOMA OF RIGHT BREAST IN FEMALE (C50.911)  Current Plans Schedule for Surgery - Right radioactive seed localized lumpectomy/ right axillary sentinel lymph node biopsy/ blue dye injection. The surgical procedure has been discussed with the patient. Potential risks, benefits, alternative treatments, and expected outcomes have been explained. All of the patient's questions at this time have been answered. The likelihood of reaching the patient's treatment goal is good. The patient understand the proposed surgical procedure and wishes to proceed.  Kelly Blackwell. Georgette Dover, MD, [facility] Surgery  General/ Trauma Surgery Beeper [phone]  07/24/2018 12:29 PM

## 2018-07-26 ENCOUNTER — Telehealth: Payer: Self-pay | Admitting: Hematology and Oncology

## 2018-07-26 ENCOUNTER — Other Ambulatory Visit: Payer: Self-pay | Admitting: Surgery

## 2018-07-26 ENCOUNTER — Encounter: Payer: Self-pay | Admitting: Hematology and Oncology

## 2018-07-26 DIAGNOSIS — C50911 Malignant neoplasm of unspecified site of right female breast: Secondary | ICD-10-CM

## 2018-07-26 NOTE — Telephone Encounter (Signed)
A new patient appt has been scheduled for the pt to see Dr. Lindi Adie on 1/13 at 1pm. I cld and lft the appt date and time on her vm. Letter mailed.

## 2018-07-26 NOTE — Progress Notes (Signed)
Location of Breast Cancer: Right Breast  Histology per Pathology Report:  07/17/18 Diagnosis Breast, right, needle core biopsy, 1 o'clock - INVASIVE MAMMARY CARCINOMA, GRADE I/II. SEE COMMENT.  Receptor Status: ER(100%), PR (80%), Her2-neu (NEG), Ki-(5%)  Did patient present with symptoms or was this found on screening mammography?: It was discovered on a screening mammogram.   Past/Anticipated interventions by surgeon, if any: Dr. Georgette Dover plans surgery 08/03/18  Past/Anticipated interventions by medical oncology, if any:  Dr. Lindi Adie 07/31/18  Lymphedema issues, if any:  N/A  Pain issues, if any:  She denies.   SAFETY ISSUES:  Prior radiation? No  Pacemaker/ICD? No  Possible current pregnancy? No  Is the patient on methotrexate? No  Current Complaints / other details:    BP 128/60 (BP Location: Left Arm, Patient Position: Sitting)   Pulse 74   Temp 98.5 F (36.9 C) (Oral)   Resp 18   Ht 5' 1.5" (1.562 m)   Wt 125 lb 4 oz (56.8 kg)   SpO2 100%   BMI 23.28 kg/m    Wt Readings from Last 3 Encounters:  07/31/18 125 lb 4 oz (56.8 kg)     Laureen Frederic, Stephani Police, RN 07/26/2018,9:09 AM

## 2018-07-28 NOTE — Pre-Procedure Instructions (Signed)
Kelly Blackwell  07/28/2018      [id number] - [place], [place] [address] [place] [address] Phone: [phone] Fax: [phone]    Your procedure is scheduled on January 16th.  Report to [facility] Admitting at 5:30 A.M.  Call this number if you have problems the morning of surgery:  [phone]   Remember:  Do not eat or drink after midnight.    You may drink clear liquids until 4:30 AM day of surgery .    Clear liquids allowed are: Water, Juice (non-citric and without pulp), Carbonated beverages, Clear Tea, Black Coffee only, Plain Jell-O only and Gatorade    Take these medicines the morning of surgery with A SIP OF WATER   Alprazolam (Xanax)  Omeprazole (Prilosec)  Follow your surgeon's instructions on when to stop Asprin.  If no instructions were given by your surgeon then you will need to call the office to get those instructions.    7 days prior to surgery STOP taking any Aspirin (unless otherwise instructed by your surgeon), Aleve, Naproxen, Ibuprofen, Motrin, Advil, Goody's, BC's, all herbal medications, fish oil, and all vitamins.     Do not wear jewelry, make-up or nail polish.  Do not wear lotions, powders, or perfumes, or deodorant.  Do not shave 48 hours prior to surgery.  Men may shave face and neck.  Do not bring valuables to the hospital.  [facility] is not responsible for any belongings or valuables.   [facility]- Preparing For Surgery  Before surgery, you can play an important role. Because skin is not sterile, your skin needs to be as free of germs as possible. You can reduce the number of germs on your skin by washing with CHG (chlorahexidine gluconate) Soap before surgery.  CHG is an antiseptic cleaner which kills germs and bonds with the skin to continue killing germs even after washing.    Oral Hygiene is also important to reduce your risk of infection.  Remember - BRUSH YOUR TEETH THE  MORNING OF SURGERY WITH YOUR REGULAR TOOTHPASTE  Please do not use if you have an allergy to CHG or antibacterial soaps. If your skin becomes reddened/irritated stop using the CHG.  Do not shave (including legs and underarms) for at least 48 hours prior to first CHG shower. It is OK to shave your face.  Please follow these instructions carefully.   1. Shower the NIGHT BEFORE SURGERY and the MORNING OF SURGERY with CHG.   2. If you chose to wash your hair, wash your hair first as usual with your normal shampoo.  3. After you shampoo, rinse your hair and body thoroughly to remove the shampoo.  4. Use CHG as you would any other liquid soap. You can apply CHG directly to the skin and wash gently with a scrungie or a clean washcloth.   5. Apply the CHG Soap to your body ONLY FROM THE NECK DOWN.  Do not use on open wounds or open sores. Avoid contact with your eyes, ears, mouth and genitals (private parts). Wash Face and genitals (private parts)  with your normal soap.  6. Wash thoroughly, paying special attention to the area where your surgery will be performed.  7. Thoroughly rinse your body with warm water from the neck down.  8. DO NOT shower/wash with your normal soap after using and rinsing off the CHG Soap.  9. Pat yourself dry with a CLEAN TOWEL.  10. Wear CLEAN PAJAMAS to bed the night before surgery, wear comfortable clothes the morning of surgery  11. Place CLEAN SHEETS on your bed the night of your first shower and DO NOT SLEEP WITH PETS.   Day of Surgery:  Do not apply any deodorants/lotions.  Please wear clean clothes to the hospital/surgery center.   Remember to brush your teeth WITH YOUR REGULAR TOOTHPASTE.   Contacts, dentures or bridgework may not be worn into surgery.  Leave your suitcase in the car.  After surgery it may be brought to your room.  For patients admitted to the hospital, discharge time will be determined by your treatment team.  Patients discharged  the day of surgery will not be allowed to drive home.   Please read over the following fact sheets that you were given. Coughing and Deep Breathing and Surgical Site Infection Prevention

## 2018-07-28 NOTE — Pre-Procedure Instructions (Signed)
   Kelly Blackwell  07/28/2018     [id number] - [place], [place] - [address] [place] [address] Phone: [phone] Fax: [phone]   Your procedure is scheduled on Thursday, August 03, 2018.  Report to [facility] Admitting at 5:30 A.M.  Call this number if you have problems the morning of surgery:  [phone]   Remember:  Do not eat or drink after midnight Wednesday, August 02, 2018  You may drink clear liquids until 4:30 A.M. .  Clear liquids allowed are: Water, Juice (non-citric and without pulp), Carbonated beverages, Clear Tea, Black Coffee only, Plain Jell-O only, Gatorade and Plain Popsicles only    Take these medicines the morning of surgery with A SIP OF WATER:cetirizine (ZYRTEC), omeprazole (PRILOSEC), if needed: ALPRAZolam Duanne Moron) for anxiety  Stop taking Aspirin (unless advised otherwise by your surgeon), vitamins, fish oil and herbal medications. Do not take any NSAIDs ie: Ibuprofen, Advil, Naproxen (Aleve), Motrin, BC and Goody Powder; stop now. Brush your teeth the morning of surgery with your regular toothpaste.   Do not wear jewelry, make-up or nail polish.  Do not wear lotions, powders, or perfumes, or deodorant.  Do not shave 48 hours prior to surgery.   Do not bring valuables to the hospital.  [facility] is not responsible for any belongings or valuables. Contacts, dentures or bridgework may not be worn into surgery.  Leave your suitcase in the car.  After surgery it may be brought to your room. Patients discharged the day of surgery will not be allowed to drive home.  Special instructions: See " [place]-Preparing For Surgery" sheet. Please read over the following fact sheets that you were given. Pain Booklet, Coughing and Deep Breathing and Surgical Site Infection Prevention

## 2018-07-31 ENCOUNTER — Encounter: Payer: Self-pay | Admitting: Radiation Oncology

## 2018-07-31 ENCOUNTER — Inpatient Hospital Stay: Payer: PPO | Attending: Hematology and Oncology | Admitting: Hematology and Oncology

## 2018-07-31 ENCOUNTER — Ambulatory Visit
Admission: RE | Admit: 2018-07-31 | Discharge: 2018-07-31 | Disposition: A | Discharge status: Home / Self Care | Payer: PPO | Source: Ambulatory Visit | Attending: Radiation Oncology | Admitting: Radiation Oncology

## 2018-07-31 ENCOUNTER — Other Ambulatory Visit: Payer: Self-pay

## 2018-07-31 ENCOUNTER — Encounter (HOSPITAL_COMMUNITY)
Admission: RE | Admit: 2018-07-31 | Discharge: 2018-07-31 | Disposition: A | Discharge status: Home / Self Care | Payer: PPO | Source: Ambulatory Visit | Attending: Surgery | Admitting: Surgery

## 2018-07-31 ENCOUNTER — Encounter (HOSPITAL_COMMUNITY): Payer: Self-pay

## 2018-07-31 ENCOUNTER — Telehealth: Payer: Self-pay | Admitting: Hematology and Oncology

## 2018-07-31 VITALS — BP 128/60 | HR 74 | Temp 98.5°F | Resp 18 | Ht 61.5 in | Wt 125.2 lb

## 2018-07-31 DIAGNOSIS — Z79899 Other long term (current) drug therapy: Secondary | ICD-10-CM | POA: Insufficient documentation

## 2018-07-31 DIAGNOSIS — Z7982 Long term (current) use of aspirin: Secondary | ICD-10-CM

## 2018-07-31 DIAGNOSIS — R9431 Abnormal electrocardiogram [ECG] [EKG]: Secondary | ICD-10-CM

## 2018-07-31 DIAGNOSIS — C50211 Malignant neoplasm of upper-inner quadrant of right female breast: Secondary | ICD-10-CM | POA: Diagnosis not present

## 2018-07-31 DIAGNOSIS — Z170 Estrogen receptor positive status [ER+]: Secondary | ICD-10-CM | POA: Insufficient documentation

## 2018-07-31 DIAGNOSIS — M1990 Unspecified osteoarthritis, unspecified site: Secondary | ICD-10-CM | POA: Diagnosis not present

## 2018-07-31 DIAGNOSIS — E7800 Pure hypercholesterolemia, unspecified: Secondary | ICD-10-CM | POA: Diagnosis not present

## 2018-07-31 DIAGNOSIS — I10 Essential (primary) hypertension: Secondary | ICD-10-CM | POA: Insufficient documentation

## 2018-07-31 DIAGNOSIS — Z01818 Encounter for other preprocedural examination: Secondary | ICD-10-CM | POA: Insufficient documentation

## 2018-07-31 DIAGNOSIS — Z800 Family history of malignant neoplasm of digestive organs: Secondary | ICD-10-CM | POA: Diagnosis not present

## 2018-07-31 DIAGNOSIS — F419 Anxiety disorder, unspecified: Secondary | ICD-10-CM | POA: Diagnosis not present

## 2018-07-31 DIAGNOSIS — K219 Gastro-esophageal reflux disease without esophagitis: Secondary | ICD-10-CM | POA: Diagnosis not present

## 2018-07-31 DIAGNOSIS — Z79890 Hormone replacement therapy (postmenopausal): Secondary | ICD-10-CM | POA: Diagnosis not present

## 2018-07-31 DIAGNOSIS — Z803 Family history of malignant neoplasm of breast: Secondary | ICD-10-CM | POA: Diagnosis not present

## 2018-07-31 HISTORY — DX: Anxiety disorder, unspecified: F41.9

## 2018-07-31 HISTORY — DX: Essential (primary) hypertension: I10

## 2018-07-31 HISTORY — DX: Unspecified osteoarthritis, unspecified site: M19.90

## 2018-07-31 HISTORY — DX: Other specified disorders of bone density and structure, unspecified site: M85.80

## 2018-07-31 HISTORY — DX: Allergy, unspecified, initial encounter: T78.40XA

## 2018-07-31 HISTORY — DX: Pure hypercholesterolemia, unspecified: E78.00

## 2018-07-31 LAB — BASIC METABOLIC PANEL
Anion gap: 7 (ref 5–15)
BUN: 15 mg/dL (ref 8–23)
CHLORIDE: 100 mmol/L (ref 98–111)
CO2: 27 mmol/L (ref 22–32)
Calcium: 9.6 mg/dL (ref 8.9–10.3)
Creatinine, Ser: 0.83 mg/dL (ref 0.44–1.00)
GFR calc Af Amer: >60 mL/min (ref >60)
GFR calc non Af Amer: >60 mL/min (ref >60)
Glucose, Bld: 98 mg/dL (ref 70–99)
POTASSIUM: 3.6 mmol/L (ref 3.5–5.1)
Sodium: 134 mmol/L — ABNORMAL LOW (ref 135–145)

## 2018-07-31 LAB — CBC
HCT: 41.4 % (ref 36.0–46.0)
HEMOGLOBIN: 13.6 g/dL (ref 12.0–15.0)
MCH: 29.5 pg (ref 26.0–34.0)
MCHC: 32.9 g/dL (ref 30.0–36.0)
MCV: 89.8 fL (ref 80.0–100.0)
Platelets: 268 10*3/uL (ref 150–400)
RBC: 4.61 MIL/uL (ref 3.87–5.11)
RDW: 11.8 % (ref 11.5–15.5)
WBC: 6.5 10*3/uL (ref 4.0–10.5)
nRBC: 0 % (ref 0.0–0.2)

## 2018-07-31 NOTE — Telephone Encounter (Signed)
Gave patient avs report and appointments for January  °

## 2018-07-31 NOTE — Progress Notes (Signed)
[place] CONSULT NOTE  Patient Care Team: Leanna Battles, MD as PCP - General (Internal Medicine)  CHIEF COMPLAINTS/PURPOSE OF CONSULTATION:  Newly diagnosed breast cancer  HISTORY OF PRESENTING ILLNESS:  Kelly Blackwell 78 y.o. female is here because of recent diagnosis of right breast cancer.  Patient had a routine screening mammogram that detected a 1.1 cm mass in the right breast at 1 o'clock position.  There were no axillary lymph nodes.  Biopsy of this mass came back as invasive ductal carcinoma grade 1 through 2 that was ER PR positive HER-2 negative with a Ki-67 of 5%.  She was seen by surgery and was referred to [place] for discussion regarding multidisciplinary management.  I reviewed her records extensively and collaborated the history with the patient.  SUMMARY OF ONCOLOGIC HISTORY:   Carcinoma of upper-inner quadrant of right breast in female, estrogen receptor positive ([place])   07/31/2018 Initial Diagnosis    Screening detected right breast mass at 1 o'clock position 1.1 cm, no axillary lymph nodes, biopsy revealed IDC grade 1-2, ER 100%, PR 80%, Ki-67 5%, HER-2 negative, 2+ by IHC and ratio 1.22 by FISH, T1CN0 stage Ia    07/31/2018 Cancer Staging    Staging form: Breast, AJCC 8th Edition - Clinical: Stage IA (cT1c, cN0, cM0, G1, ER+, PR+, HER2-) - Signed by Eppie Gibson, MD on 07/31/2018      MEDICAL HISTORY:  Past Medical History:  Diagnosis Date  . Allergy    seasonal allergies.   . Anxiety   . Arthritis    Neck  . Hypercholesteremia   . Hypertension   . Osteopenia     SURGICAL HISTORY: Past Surgical History:  Procedure Laterality Date  . APPENDECTOMY     age 19  . bunionectmy     73  . CHOLECYSTECTOMY     age 31  . TONSILLECTOMY      SOCIAL HISTORY: Social History   Socioeconomic History  . Marital status: Married    Spouse name: Not on file  . Number of children: 3  . Years of education: Not on file  . Highest education  level: Not on file  Occupational History  . Not on file  Social Needs  . Financial resource strain: Not on file  . Food insecurity:    Worry: Not on file    Inability: Not on file  . Transportation needs:    Medical: No    Non-medical: No  Tobacco Use  . Smoking status: Never Smoker  . Smokeless tobacco: Never Used  Substance and Sexual Activity  . Alcohol use: Yes    Comment: occasionally, a couple of times a month.   . Drug use: Never  . Sexual activity: Not on file  Lifestyle  . Physical activity:    Days per week: Not on file    Minutes per session: Not on file  . Stress: Not on file  Relationships  . Social connections:    Talks on phone: Not on file    Gets together: Not on file    Attends religious service: Not on file    Active member of club or organization: Not on file    Attends meetings of clubs or organizations: Not on file    Relationship status: Not on file  . Intimate partner violence:    Fear of current or ex partner: No    Emotionally abused: No    Physically abused: No    Forced sexual activity: No  Other Topics Concern  . Not on file  Social History Narrative  . Not on file    FAMILY HISTORY: No family history on file.  ALLERGIES:  is allergic to sulfa antibiotics.  MEDICATIONS:  Current Outpatient Medications  Medication Sig Dispense Refill  . ALPRAZolam (XANAX) 0.25 MG tablet Take 0.25 mg by mouth 2 (two) times daily as needed for anxiety.    Marland Kitchen aspirin EC 81 MG tablet Take 81 mg by mouth daily.    Marland Kitchen atorvastatin (LIPITOR) 10 MG tablet Take 10 mg by mouth daily.     . Calcium Carb-Cholecalciferol (CALCIUM 600 + D PO) Take 1 tablet by mouth 2 (two) times daily.    . cetirizine (ZYRTEC) 10 MG tablet Take 10 mg by mouth daily.    . cholecalciferol (VITAMIN D3) 25 MCG (1000 UT) tablet Take 1,000 Units by mouth daily.    Marland Kitchen co-enzyme Q-10 30 MG capsule Take 100 mg by mouth daily.    . Multiple Vitamins-Minerals (MULTIVITAMIN WITH MINERALS)  tablet Take 1 tablet by mouth 3 (three) times a week.    Marland Kitchen omeprazole (PRILOSEC) 20 MG capsule Take 20 mg by mouth daily.    Marland Kitchen telmisartan-hydrochlorothiazide (MICARDIS HCT) 80-12.5 MG tablet Take 1 tablet by mouth daily.    . vitamin C (ASCORBIC ACID) 500 MG tablet Take 500 mg by mouth daily.     No current facility-administered medications for this visit.     REVIEW OF SYSTEMS:   Constitutional: Denies fevers, chills or abnormal night sweats Eyes: Denies blurriness of vision, double vision or watery eyes Ears, nose, mouth, throat, and face: Denies mucositis or sore throat Respiratory: Denies cough, dyspnea or wheezes Cardiovascular: Denies palpitation, chest discomfort or lower extremity swelling Gastrointestinal:  Denies nausea, heartburn or change in bowel habits Skin: Denies abnormal skin rashes Lymphatics: Denies new lymphadenopathy or easy bruising Neurological:Denies numbness, tingling or new weaknesses Behavioral/Psych: Mood is stable, no new changes  Breast:  Denies any palpable lumps or discharge All other systems were reviewed with the patient and are negative.  PHYSICAL EXAMINATION: ECOG PERFORMANCE STATUS: 0 - Asymptomatic  Vitals:   07/31/18 1305  BP: 132/63  Pulse: 83  Resp: 17  Temp: 98 F (36.7 C)  SpO2: 100%   Filed Weights   07/31/18 1305  Weight: 125 lb 11.2 oz (57 kg)    GENERAL:alert, no distress and comfortable SKIN: skin color, texture, turgor are normal, no rashes or significant lesions EYES: normal, conjunctiva are pink and non-injected, sclera clear OROPHARYNX:no exudate, no erythema and lips, buccal mucosa, and tongue normal  NECK: supple, thyroid normal size, non-tender, without nodularity LYMPH:  no palpable lymphadenopathy in the cervical, axillary or inguinal LUNGS: clear to auscultation and percussion with normal breathing effort HEART: regular rate & rhythm and no murmurs and no lower extremity edema ABDOMEN:abdomen soft, non-tender  and normal bowel sounds Musculoskeletal:no cyanosis of digits and no clubbing  PSYCH: alert & oriented x 3 with fluent speech NEURO: no focal motor/sensory deficits BREAST: No palpable nodules in breast. No palpable axillary or supraclavicular lymphadenopathy (exam performed in the presence of a chaperone)   LABORATORY DATA:  I have reviewed the data as listed Lab Results  Component Value Date   WBC 6.5 07/31/2018   HGB 13.6 07/31/2018   HCT 41.4 07/31/2018   MCV 89.8 07/31/2018   PLT 268 07/31/2018   Lab Results  Component Value Date   NA 134 (L) 07/31/2018   K 3.6 07/31/2018   CL  100 07/31/2018   CO2 27 07/31/2018    RADIOGRAPHIC STUDIES: I have personally reviewed the radiological reports and agreed with the findings in the report.  ASSESSMENT AND PLAN:  Carcinoma of upper-inner quadrant of right breast in female, estrogen receptor positive ([place]) 07/17/2018:Screening detected right breast mass at 1 o'clock position 1.1 cm, no axillary lymph nodes, biopsy revealed IDC grade 1-2, ER 100%, PR 80%, Ki-67 5%, HER-2 negative, 2+ by IHC and ratio 1.22 by FISH, T1CN0 stage Ia  Pathology and radiology counseling:Discussed with the patient, the details of pathology including the type of breast cancer,the clinical staging, the significance of ER, PR and HER-2/neu receptors and the implications for treatment. After reviewing the pathology in detail, we proceeded to discuss the different treatment options between surgery, radiation, chemotherapy, antiestrogen therapies.  Recommendations: 1. Breast conserving surgery followed by 2. Adjuvant radiation therapy followed by 3. Adjuvant antiestrogen therapy  Return to clinic after surgery to discuss final pathology report  All questions were answered. The patient knows to call the clinic with any problems, questions or concerns.    Harriette Ohara, MD 07/31/18

## 2018-07-31 NOTE — Assessment & Plan Note (Signed)
07/17/2018:Screening detected right breast mass at 1 o'clock position 1.1 cm, no axillary lymph nodes, biopsy revealed IDC grade 1-2, ER 100%, PR 80%, Ki-67 5%, HER-2 negative, 2+ by IHC and ratio 1.22 by FISH, T1CN0 stage Ia  Pathology and radiology counseling:Discussed with the patient, the details of pathology including the type of breast cancer,the clinical staging, the significance of ER, PR and HER-2/neu receptors and the implications for treatment. After reviewing the pathology in detail, we proceeded to discuss the different treatment options between surgery, radiation, chemotherapy, antiestrogen therapies.  Recommendations: 1. Breast conserving surgery followed by 2. +/- Adjuvant radiation therapy followed by 4. Adjuvant antiestrogen therapy   Return to clinic after surgery to discuss final pathology report

## 2018-07-31 NOTE — Progress Notes (Signed)
PCP: Leanna Battles  DM: denies  SA: denies  Pt denies SOB, cough, fever, chest pain @ PAT appt.  Pt stated understanding of instructions given for DOS.

## 2018-08-01 ENCOUNTER — Encounter: Payer: Self-pay | Admitting: *Deleted

## 2018-08-01 ENCOUNTER — Encounter: Payer: Self-pay | Admitting: General Practice

## 2018-08-01 NOTE — Progress Notes (Signed)
Radiation Oncology         [phone] ________________________________  Initial outpatient Consultation  Name: Kelly Blackwell MRN: [id number]  Date: 07/31/2018  DOB: Mar 17, 1941  [id number], Quillian Quince, MD  Donnie Mesa, MD   REFERRING PHYSICIAN: Donnie Mesa, MD  DIAGNOSIS:    ICD-10-CM   1. Carcinoma of upper-inner quadrant of right breast in female, estrogen receptor positive ([place]) C50.211 Ambulatory referral to Social Work   Z17.0   Cancer Staging Carcinoma of upper-inner quadrant of right breast in female, estrogen receptor positive ([place]) Staging form: Breast, AJCC 8th Edition - Clinical: Stage IA (cT1c, cN0, cM0, G1, ER+, PR+, HER2-) - Signed by Eppie Gibson, MD on 07/31/2018   CHIEF COMPLAINT: Here to discuss management of right breast cancer  HISTORY OF PRESENT ILLNESS::Kelly Blackwell is a 78 y.o. female who presented with breast abnormality on the following imaging: routine screening mammogram on the date of 07/03/2018 showing a 1.1cm mass at 1:00.  Symptoms, if any, at that time, were: none.   Ultrasound of breast on 07/13/2018 revealed suspicious right breast mass/distortion at the 1 o'clock position. Axilla negative.  Biopsy on date of 07/07/2018 showed invasive mammary carcinoma. Immunostaining supported a ductal carcinoma phenotype. ER status: 100% positive; PR status 80% positive, Her2 status negative; Grade 1/2.  She is scheduled to see Dr. Lindi Adie later today. She is also scheduled for right breast lumpectomy on 08/03/2018 under Dr. Georgette Dover. She is in her USOH.   PREVIOUS RADIATION THERAPY: No  PAST MEDICAL HISTORY:  has a past medical history of Allergy, Anxiety, Arthritis, Hypercholesteremia, Hypertension, and Osteopenia.    PAST SURGICAL HISTORY: Past Surgical History:  Procedure Laterality Date  . APPENDECTOMY     age 38  . bunionectmy     52  . CHOLECYSTECTOMY     age 65  . TONSILLECTOMY      FAMILY HISTORY: no reported related cancers in  family  SOCIAL HISTORY:  reports that she has never smoked. She has never used smokeless tobacco. She reports current alcohol use. She reports that she does not use drugs.  ALLERGIES: Sulfa antibiotics  MEDICATIONS:  Current Outpatient Medications  Medication Sig Dispense Refill  . ALPRAZolam (XANAX) 0.25 MG tablet Take 0.25 mg by mouth 2 (two) times daily as needed for anxiety.    Marland Kitchen atorvastatin (LIPITOR) 10 MG tablet Take 10 mg by mouth daily.     . Calcium Carb-Cholecalciferol (CALCIUM 600 + D PO) Take 1 tablet by mouth 2 (two) times daily.    . cetirizine (ZYRTEC) 10 MG tablet Take 10 mg by mouth daily.    . cholecalciferol (VITAMIN D3) 25 MCG (1000 UT) tablet Take 1,000 Units by mouth daily.    Marland Kitchen co-enzyme Q-10 30 MG capsule Take 100 mg by mouth daily.    . Multiple Vitamins-Minerals (MULTIVITAMIN WITH MINERALS) tablet Take 1 tablet by mouth 3 (three) times a week.    Marland Kitchen omeprazole (PRILOSEC) 20 MG capsule Take 20 mg by mouth daily.    Marland Kitchen telmisartan-hydrochlorothiazide (MICARDIS HCT) 80-12.5 MG tablet Take 1 tablet by mouth daily.    Marland Kitchen aspirin EC 81 MG tablet Take 81 mg by mouth daily.    . vitamin C (ASCORBIC ACID) 500 MG tablet Take 500 mg by mouth daily.     No current facility-administered medications for this encounter.     REVIEW OF SYSTEMS: A 10+ POINT REVIEW OF SYSTEMS WAS OBTAINED including neurology, dermatology, psychiatry, cardiac, respiratory, lymph, extremities, GI, GU, Musculoskeletal, constitutional, breasts,  reproductive, HEENT.  All pertinent positives are noted in the HPI.  All others are negative.   PHYSICAL EXAM:  height is 5' 1.5" (1.562 m) and weight is 125 lb 4 oz (56.8 kg). Her oral temperature is 98.5 F (36.9 C). Her blood pressure is 128/60 and her pulse is 74. Her respiration is 18 and oxygen saturation is 100%.   General: Alert and oriented, in no acute distress HEENT: Head is normocephalic. Extraocular movements are intact. Oropharynx is clear. Neck:  Neck is supple, no palpable cervical or supraclavicular lymphadenopathy. Heart: Regular in rate and rhythm with no murmurs, rubs, or gallops. Chest: Clear to auscultation bilaterally, with no rhonchi, wheezes, or rales. Abdomen: Soft, nontender, nondistended, with no rigidity or guarding. Extremities: No cyanosis or edema. Lymphatics: see Neck Exam Skin: No concerning lesions. Musculoskeletal: symmetric strength and muscle tone throughout. Neurologic: Cranial nerves II through XII are grossly intact. No obvious focalities. Speech is fluent. Coordination is intact. Psychiatric: Judgment and insight are intact. Affect is appropriate. Breasts: No palpable masses appreciated in the breasts or axillae bilaterally .  ECOG = 0  0 - Asymptomatic (Fully active, able to carry on all predisease activities without restriction)  1 - Symptomatic but completely ambulatory (Restricted in physically strenuous activity but ambulatory and able to carry out work of a light or sedentary nature. For example, light housework, office work)  2 - Symptomatic, <50% in bed during the day (Ambulatory and capable of all self care but unable to carry out any work activities. Up and about more than 50% of waking hours)  3 - Symptomatic, >50% in bed, but not bedbound (Capable of only limited self-care, confined to bed or chair 50% or more of waking hours)  4 - Bedbound (Completely disabled. Cannot carry on any self-care. Totally confined to bed or chair)  5 - Death   Oken MM, Creech RH, Tormey DC, et al. (1982). "Toxicity and response criteria of the Eastern Cooperative Oncology Group". Am. J. Clin. Oncol. 5 (6): 649-55   LABORATORY DATA:  Lab Results  Component Value Date   WBC 6.5 07/31/2018   HGB 13.6 07/31/2018   HCT 41.4 07/31/2018   MCV 89.8 07/31/2018   PLT 268 07/31/2018   CMP     Component Value Date/Time   NA 134 (L) 07/31/2018 1138   K 3.6 07/31/2018 1138   CL 100 07/31/2018 1138   CO2 27  07/31/2018 1138   GLUCOSE 98 07/31/2018 1138   BUN 15 07/31/2018 1138   CREATININE 0.83 07/31/2018 1138   CALCIUM 9.6 07/31/2018 1138   GFRNONAA >60 07/31/2018 1138   GFRAA >60 07/31/2018 1138         RADIOGRAPHY: Us Breast Ltd Uni Right Inc Axilla  Result Date: 07/13/2018 CLINICAL DATA:  Screening recall for right breast distortion. EXAM: DIGITAL DIAGNOSTIC UNILATERAL RIGHT MAMMOGRAM WITH CAD AND TOMO RIGHT BREAST ULTRASOUND COMPARISON:  Previous exam(s). ACR Breast Density Category c: The breast tissue is heterogeneously dense, which may obscure small masses. FINDINGS: Spot compression views of the right breast were performed. There is a persistent area of distortion within the upper to slightly inner right breast. Mammographic images were processed with CAD. Physical examination of the superior right breast does not reveal any palpable masses. Targeted ultrasound of the right breast was performed. There is an irregular hypoechoic mass at the 1 o'clock position 3 cm from nipple measuring 1.1 x 0.6 x 1.1 cm. This corresponds well with mammography findings. No lymphadenopathy seen in   the right axilla. IMPRESSION: Suspicious right breast mass/distortion. RECOMMENDATION: Ultrasound-guided biopsy of the mass in the right breast at the 1 o'clock position is recommended. I have discussed the findings and recommendations with the patient. Results were also provided in writing at the conclusion of the visit. If applicable, a reminder letter will be sent to the patient regarding the next appointment. BI-RADS CATEGORY  4: Suspicious. Electronically Signed   By: Jennifer  Jarosz M.D.   On: 07/13/2018 13:22   Mm Diag Breast Tomo Uni Right  Result Date: 07/13/2018 CLINICAL DATA:  Screening recall for right breast distortion. EXAM: DIGITAL DIAGNOSTIC UNILATERAL RIGHT MAMMOGRAM WITH CAD AND TOMO RIGHT BREAST ULTRASOUND COMPARISON:  Previous exam(s). ACR Breast Density Category c: The breast tissue is  heterogeneously dense, which may obscure small masses. FINDINGS: Spot compression views of the right breast were performed. There is a persistent area of distortion within the upper to slightly inner right breast. Mammographic images were processed with CAD. Physical examination of the superior right breast does not reveal any palpable masses. Targeted ultrasound of the right breast was performed. There is an irregular hypoechoic mass at the 1 o'clock position 3 cm from nipple measuring 1.1 x 0.6 x 1.1 cm. This corresponds well with mammography findings. No lymphadenopathy seen in the right axilla. IMPRESSION: Suspicious right breast mass/distortion. RECOMMENDATION: Ultrasound-guided biopsy of the mass in the right breast at the 1 o'clock position is recommended. I have discussed the findings and recommendations with the patient. Results were also provided in writing at the conclusion of the visit. If applicable, a reminder letter will be sent to the patient regarding the next appointment. BI-RADS CATEGORY  4: Suspicious. Electronically Signed   By: Jennifer  Jarosz M.D.   On: 07/13/2018 13:22   Mm Clip Placement Right  Result Date: 07/17/2018 CLINICAL DATA:  Post ultrasound-guided core needle biopsy of right breast 1 o'clock mass EXAM: DIAGNOSTIC RIGHT MAMMOGRAM POST ULTRASOUND BIOPSY COMPARISON:  Previous exam(s). FINDINGS: Mammographic images were obtained following ultrasound guided biopsy of right breast 1 o'clock mass. Two-view mammography demonstrates presence of ribbon shaped marker in the right breast, within the area of architectural distortion demonstrated mammographically. The marker is located 4 mm anterior to the geometric center of the distortion. IMPRESSION: Successful placement of ribbon shaped marker post ultrasound-guided core needle biopsy of right breast 1 o'clock mass. Final Assessment: Post Procedure Mammograms for Marker Placement Electronically Signed   By: Dobrinka  Dimitrova M.D.    On: 07/17/2018 16:31   Us Rt Breast Bx W Loc Dev 1st Lesion Img Bx Spec Us Guide  Addendum Date: 07/18/2018   ADDENDUM REPORT: 07/18/2018 14:42 ADDENDUM: Pathology revealed GRADE I-II INVASIVE MAMMARY CARCINOMA of the Right breast, 1 o'clock. This was found to be concordant by Dr. Dobrinka Dimitrova. Pathology results were discussed with the patient by telephone. The patient reported doing well after the biopsy with tenderness at the site. Post biopsy instructions and care were reviewed and questions were answered. The patient was encouraged to call [facility] for any additional concerns. Surgical consultation has been arranged with Dr. Matthew Tsuei at [facility] on July 24, 2018. Pathology results reported by Lynne Bailey, RN on 07/18/2018. Electronically Signed   By: Dobrinka  Dimitrova M.D.   On: 07/18/2018 14:42   Result Date: 07/18/2018 CLINICAL DATA:  Right breast 1 o'clock mass. EXAM: ULTRASOUND GUIDED RIGHT BREAST CORE NEEDLE BIOPSY Scratch COMPARISON:  Previous exam(s). FINDINGS: I met with the patient and we discussed   the procedure of ultrasound-guided biopsy, including benefits and alternatives. We discussed the high likelihood of a successful procedure. We discussed the risks of the procedure, including infection, bleeding, tissue injury, clip migration, and inadequate sampling. Informed written consent was given. The usual time-out protocol was performed immediately prior to the procedure. Lesion quadrant: Upper inner quadrant Using sterile technique and 1% Lidocaine as local anesthetic, under direct ultrasound visualization, a 14 gauge spring-loaded device was used to perform biopsy of right breast 1 o'clock mass using a inferior approach. At the conclusion of the procedure a ribbon shaped tissue marker clip was deployed into the biopsy cavity. Follow up 2 view mammogram was performed and dictated separately. IMPRESSION: Ultrasound guided biopsy of  right breast. No apparent complications. Electronically Signed: By: Fidela Salisbury M.D. On: 07/17/2018 16:29      IMPRESSION/PLAN: Right Breast IDC, Stage I, ER+  She is a good candidate for breast conservation. I talked to her about the option of a mastectomy and informed her that her expected overall survival would be equivalent between mastectomy and breast conservation, based upon randomized controlled data. She is enthusiastic about breast conservation.  For the patient's early stage favorable risk breast cancer, we had a thorough discussion about her options for adjuvant therapy. One option would be antiestrogen therapy as discussed with medical oncology. She would take a pill for approximately 5 years. The alternative option (but less standard) would be radiotherapy to the breast. The most aggressive option would be to pursue both modalities.  Of note, I discussed the data from the [employer] al trial in the [place] of [employer]. She understands that tamoxifen compared to radiation plus tamoxifen demonstrated no survival benefit among the women in this study. The women were 26 years or older with stage I estrogen receptor positive breast cancer. Based on this study, I told the patient that her overall life expectancy should not be affected by adding radiotherapy to antiestrogen medication. She understands that the main benefit of  adding radiotherapy to anti estrogen therapy would be a very small but measurable local control benefit (risk of local recurrence to be lowered from ~10% --> ~2% over a decade).  We discussed the fact that radiotherapy only provides a local control benefit while anti-estrogen pills provide systemic coverage. That being said, the risk of systemic failure is relatively low with her type of breast cancer.  She would like to pursue radiation therapy.  We discussed the risks benefits and side effects of radiotherapy. She understands that the side effects would  likely include some skin irritation and fatigue during the weeks of radiation. There is a risk of late effects which include but are not necessarily limited to cosmetic changes and rare lung toxicity. I would anticipate delivering approximately 4 weeks of radiotherapy. No guarantees of treatment were given. The patient is enthusiastic about proceeding with treatment. I look forward to participating in the patient's care.  I will await her referral back to me for postoperative follow-up and eventual CT simulation/treatment planning. __________________________________________   Eppie Gibson, MD   This document serves as a record of services personally performed by Eppie Gibson, MD. It was created on her behalf by Wilburn Mylar, a trained medical scribe. The creation of this record is based on the scribe's personal observations and the provider's statements to them. This document has been checked and approved by the attending provider.

## 2018-08-01 NOTE — Progress Notes (Signed)
[facility] Psychosocial Distress Screening Clinical Social Work  Clinical Social Work was referred by distress screening protocol.  The patient scored a 8 on the Psychosocial Distress Thermometer which indicates moderate distress. Clinical Social Worker contacted patient by phone to assess for distress and other psychosocial needs. "As far as my breast cancer is concerned, I'm relieved."  "There are other things that are big stress factors, the breast cancer was just an extra hit to the gut." Feels she got a "good report" on her cancer, aware that she will have short term life disruption for treatment, but "I am really not worried about my cancer."  To cope w other life stressors is trying to "isolate" herself, "answering questions makes it worse."  Also had antianxiety medications prescribed which she takes as needed.  Has difficulty sleeping due to "worries."  Feels that breast cancer diagnosis and treatment is minor issue compared to other life issues.  Uses coping skills to manage anxiety.  Described services available through [facility], encouraged patient to reach out as needed/if helpful.    ONCBCN DISTRESS SCREENING 07/31/2018  Screening Type Initial Screening  Distress experienced in past week (1-10) 8  Family Problem type Partner  Emotional problem type Nervousness/Anxiety  Information Concerns Type Lack of info about treatment  Physical Problem type Sleep/insomnia    Clinical Social Worker follow up needed: No.  If yes, follow up plan:  Beverely Pace, [place], LCSW Clinical Social Worker Phone:  [phone]

## 2018-08-02 ENCOUNTER — Ambulatory Visit
Admission: RE | Admit: 2018-08-02 | Discharge: 2018-08-02 | Disposition: A | Discharge status: Home / Self Care | Payer: PPO | Source: Ambulatory Visit | Attending: Surgery | Admitting: Surgery

## 2018-08-02 ENCOUNTER — Encounter: Payer: Self-pay | Admitting: Radiation Oncology

## 2018-08-02 DIAGNOSIS — C50911 Malignant neoplasm of unspecified site of right female breast: Secondary | ICD-10-CM

## 2018-08-02 NOTE — Anesthesia Preprocedure Evaluation (Addendum)
Anesthesia Evaluation  Patient identified by MRN, date of birth, ID band Patient awake    Reviewed: Allergy & Precautions, NPO status , Patient's Chart, lab work & pertinent test results  History of Anesthesia Complications Negative for: history of anesthetic complications  Airway Mallampati: II  TM Distance: >3 FB Neck ROM: Full    Dental no notable dental hx. (+) Teeth Intact, Dental Advisory Given   Pulmonary neg pulmonary ROS,    Pulmonary exam normal        Cardiovascular hypertension, Pt. on medications Normal cardiovascular exam     Neuro/Psych PSYCHIATRIC DISORDERS Anxiety negative neurological ROS     GI/Hepatic negative GI ROS, Neg liver ROS,   Endo/Other  negative endocrine ROS  Renal/GU negative Renal ROS     Musculoskeletal negative musculoskeletal ROS (+)   Abdominal   Peds  Hematology negative hematology ROS (+)   Anesthesia Other Findings Day of surgery medications reviewed with the patient.  Reproductive/Obstetrics                           Anesthesia Physical Anesthesia Plan  ASA: II  Anesthesia Plan: General   Post-op Pain Management:  Regional for Post-op pain   Induction: Intravenous  PONV Risk Score and Plan: 3 and Ondansetron, Dexamethasone and Diphenhydramine  Airway Management Planned: LMA  Additional Equipment:   Intra-op Plan:   Post-operative Plan: Extubation in OR  Informed Consent: I have reviewed the patients History and Physical, chart, labs and discussed the procedure including the risks, benefits and alternatives for the proposed anesthesia with the patient or authorized representative who has indicated his/her understanding and acceptance.     Dental advisory given  Plan Discussed with: Anesthesiologist and CRNA  Anesthesia Plan Comments:        Anesthesia Quick Evaluation

## 2018-08-03 ENCOUNTER — Ambulatory Visit (HOSPITAL_COMMUNITY)
Admission: RE | Admit: 2018-08-03 | Discharge: 2018-08-03 | Disposition: A | Discharge status: Home / Self Care | Payer: PPO | Source: Ambulatory Visit | Attending: Surgery | Admitting: Surgery

## 2018-08-03 ENCOUNTER — Ambulatory Visit (HOSPITAL_COMMUNITY): Payer: PPO | Admitting: Anesthesiology

## 2018-08-03 ENCOUNTER — Encounter (HOSPITAL_COMMUNITY): Payer: Self-pay

## 2018-08-03 ENCOUNTER — Encounter (HOSPITAL_COMMUNITY)
Admission: RE | Disposition: A | Discharge status: Home / Self Care | Payer: Self-pay | Source: Ambulatory Visit | Attending: Surgery

## 2018-08-03 ENCOUNTER — Ambulatory Visit (HOSPITAL_COMMUNITY): Payer: PPO | Admitting: Physician Assistant

## 2018-08-03 ENCOUNTER — Ambulatory Visit
Admission: RE | Admit: 2018-08-03 | Discharge: 2018-08-03 | Disposition: A | Discharge status: Home / Self Care | Payer: PPO | Source: Ambulatory Visit | Attending: Surgery | Admitting: Surgery

## 2018-08-03 DIAGNOSIS — Z803 Family history of malignant neoplasm of breast: Secondary | ICD-10-CM | POA: Diagnosis not present

## 2018-08-03 DIAGNOSIS — M1990 Unspecified osteoarthritis, unspecified site: Secondary | ICD-10-CM | POA: Diagnosis not present

## 2018-08-03 DIAGNOSIS — K219 Gastro-esophageal reflux disease without esophagitis: Secondary | ICD-10-CM | POA: Insufficient documentation

## 2018-08-03 DIAGNOSIS — C50911 Malignant neoplasm of unspecified site of right female breast: Secondary | ICD-10-CM

## 2018-08-03 DIAGNOSIS — Z79890 Hormone replacement therapy (postmenopausal): Secondary | ICD-10-CM | POA: Diagnosis not present

## 2018-08-03 DIAGNOSIS — Z170 Estrogen receptor positive status [ER+]: Secondary | ICD-10-CM | POA: Diagnosis not present

## 2018-08-03 DIAGNOSIS — E7800 Pure hypercholesterolemia, unspecified: Secondary | ICD-10-CM | POA: Diagnosis not present

## 2018-08-03 DIAGNOSIS — Z800 Family history of malignant neoplasm of digestive organs: Secondary | ICD-10-CM | POA: Insufficient documentation

## 2018-08-03 DIAGNOSIS — Z79899 Other long term (current) drug therapy: Secondary | ICD-10-CM | POA: Insufficient documentation

## 2018-08-03 DIAGNOSIS — I10 Essential (primary) hypertension: Secondary | ICD-10-CM | POA: Insufficient documentation

## 2018-08-03 DIAGNOSIS — G8918 Other acute postprocedural pain: Secondary | ICD-10-CM | POA: Diagnosis not present

## 2018-08-03 DIAGNOSIS — C50211 Malignant neoplasm of upper-inner quadrant of right female breast: Secondary | ICD-10-CM | POA: Insufficient documentation

## 2018-08-03 DIAGNOSIS — F419 Anxiety disorder, unspecified: Secondary | ICD-10-CM | POA: Insufficient documentation

## 2018-08-03 HISTORY — PX: BREAST LUMPECTOMY WITH RADIOACTIVE SEED AND SENTINEL LYMPH NODE BIOPSY: SHX6550

## 2018-08-03 HISTORY — PX: BREAST LUMPECTOMY: SHX2

## 2018-08-03 SURGERY — BREAST LUMPECTOMY WITH RADIOACTIVE SEED AND SENTINEL LYMPH NODE BIOPSY
Anesthesia: General | Site: Breast | Laterality: Right

## 2018-08-03 MED ORDER — ACETAMINOPHEN 500 MG PO TABS
1000.0000 mg | ORAL_TABLET | ORAL | Status: AC
  Administered 2018-08-03: 1000 mg via ORAL
  Filled 2018-08-03: qty 2

## 2018-08-03 MED ORDER — FENTANYL CITRATE (PF) 100 MCG/2ML IJ SOLN
INTRAMUSCULAR | Status: DC | PRN
  Administered 2018-08-03: 50 ug via INTRAVENOUS
  Administered 2018-08-03: 100 ug via INTRAVENOUS

## 2018-08-03 MED ORDER — BUPIVACAINE-EPINEPHRINE (PF) 0.25% -1:200000 IJ SOLN
INTRAMUSCULAR | Status: AC
  Filled 2018-08-03: qty 30

## 2018-08-03 MED ORDER — 0.9 % SODIUM CHLORIDE (POUR BTL) OPTIME
TOPICAL | Status: DC | PRN
  Administered 2018-08-03: 1000 mL

## 2018-08-03 MED ORDER — PROMETHAZINE HCL 25 MG/ML IJ SOLN
6.2500 mg | INTRAMUSCULAR | Status: DC | PRN
  Administered 2018-08-03: 6.25 mg via INTRAVENOUS

## 2018-08-03 MED ORDER — PROMETHAZINE HCL 25 MG/ML IJ SOLN
INTRAMUSCULAR | Status: AC
  Filled 2018-08-03: qty 1

## 2018-08-03 MED ORDER — HYDROCODONE-ACETAMINOPHEN 5-325 MG PO TABS: 1.0000 | ORAL_TABLET | Freq: Four times a day (QID) | ORAL | 0 refills | Status: DC | PRN

## 2018-08-03 MED ORDER — PROPOFOL 10 MG/ML IV BOLUS
INTRAVENOUS | Status: DC | PRN
  Administered 2018-08-03: 150 mg via INTRAVENOUS

## 2018-08-03 MED ORDER — CHLORHEXIDINE GLUCONATE CLOTH 2 % EX PADS: 6.0000 | MEDICATED_PAD | Freq: Once | CUTANEOUS | Status: DC

## 2018-08-03 MED ORDER — PROPOFOL 10 MG/ML IV BOLUS
INTRAVENOUS | Status: AC
  Filled 2018-08-03: qty 20

## 2018-08-03 MED ORDER — METHYLENE BLUE 0.5 % INJ SOLN
INTRAVENOUS | Status: AC
  Filled 2018-08-03: qty 10

## 2018-08-03 MED ORDER — BUPIVACAINE-EPINEPHRINE (PF) 0.5% -1:200000 IJ SOLN
INTRAMUSCULAR | Status: DC | PRN
  Administered 2018-08-03: 30 mL

## 2018-08-03 MED ORDER — EPHEDRINE SULFATE 50 MG/ML IJ SOLN
INTRAMUSCULAR | Status: DC | PRN
  Administered 2018-08-03: 5 mg via INTRAVENOUS
  Administered 2018-08-03 (×4): 10 mg via INTRAVENOUS

## 2018-08-03 MED ORDER — MIDAZOLAM HCL 5 MG/5ML IJ SOLN
INTRAMUSCULAR | Status: DC | PRN
  Administered 2018-08-03: 1 mg via INTRAVENOUS

## 2018-08-03 MED ORDER — FENTANYL CITRATE (PF) 250 MCG/5ML IJ SOLN
INTRAMUSCULAR | Status: AC
  Filled 2018-08-03: qty 5

## 2018-08-03 MED ORDER — SODIUM CHLORIDE 0.9 % IV SOLN
INTRAVENOUS | Status: DC | PRN
  Administered 2018-08-03: 80 ug via INTRAVENOUS
  Administered 2018-08-03: 120 ug via INTRAVENOUS
  Administered 2018-08-03 (×2): 80 ug via INTRAVENOUS

## 2018-08-03 MED ORDER — SODIUM CHLORIDE (PF) 0.9 % IJ SOLN
INTRAVENOUS | Status: DC | PRN
  Administered 2018-08-03: 08:00:00

## 2018-08-03 MED ORDER — ONDANSETRON HCL 4 MG/2ML IJ SOLN
INTRAMUSCULAR | Status: DC | PRN
  Administered 2018-08-03: 4 mg via INTRAVENOUS

## 2018-08-03 MED ORDER — MIDAZOLAM HCL 2 MG/2ML IJ SOLN
INTRAMUSCULAR | Status: AC
  Filled 2018-08-03: qty 2

## 2018-08-03 MED ORDER — LACTATED RINGERS IV SOLN
INTRAVENOUS | Status: DC | PRN
  Administered 2018-08-03 (×2): via INTRAVENOUS

## 2018-08-03 MED ORDER — BUPIVACAINE-EPINEPHRINE 0.25% -1:200000 IJ SOLN
INTRAMUSCULAR | Status: DC | PRN
  Administered 2018-08-03: 20 mL

## 2018-08-03 MED ORDER — DEXAMETHASONE SODIUM PHOSPHATE 10 MG/ML IJ SOLN
INTRAMUSCULAR | Status: DC | PRN
  Administered 2018-08-03: 10 mg via INTRAVENOUS

## 2018-08-03 MED ORDER — CEFAZOLIN SODIUM-DEXTROSE 2-4 GM/100ML-% IV SOLN
2.0000 g | INTRAVENOUS | Status: AC
  Administered 2018-08-03: 2 g via INTRAVENOUS
  Filled 2018-08-03: qty 100

## 2018-08-03 MED ORDER — FENTANYL CITRATE (PF) 100 MCG/2ML IJ SOLN: 25.0000 ug | INTRAMUSCULAR | Status: DC | PRN

## 2018-08-03 MED ORDER — EPHEDRINE SULFATE 50 MG/ML IJ SOLN: INTRAMUSCULAR | Status: DC | PRN

## 2018-08-03 MED ORDER — TECHNETIUM TC 99M SULFUR COLLOID FILTERED
1.0000 | Freq: Once | INTRAVENOUS | Status: AC | PRN
  Administered 2018-08-03: 1 via INTRADERMAL

## 2018-08-03 MED ORDER — SODIUM CHLORIDE (PF) 0.9 % IJ SOLN
INTRAMUSCULAR | Status: AC
  Filled 2018-08-03: qty 10

## 2018-08-03 MED ORDER — GABAPENTIN 300 MG PO CAPS
300.0000 mg | ORAL_CAPSULE | ORAL | Status: AC
  Administered 2018-08-03: 300 mg via ORAL
  Filled 2018-08-03: qty 1

## 2018-08-03 SURGICAL SUPPLY — 57 items
APPLIER CLIP 9.375 MED OPEN (MISCELLANEOUS) ×3
BENZOIN TINCTURE PRP APPL 2/3 (GAUZE/BANDAGES/DRESSINGS) ×3 IMPLANT
BINDER BREAST LRG (GAUZE/BANDAGES/DRESSINGS) IMPLANT
BINDER BREAST XLRG (GAUZE/BANDAGES/DRESSINGS) IMPLANT
BLADE SURG 15 STRL LF DISP TIS (BLADE) ×1 IMPLANT
BLADE SURG 15 STRL SS (BLADE) ×2
CANISTER SUCT 3000ML PPV (MISCELLANEOUS) ×3 IMPLANT
CHLORAPREP W/TINT 26ML (MISCELLANEOUS) ×3 IMPLANT
CLIP APPLIE 9.375 MED OPEN (MISCELLANEOUS) ×1 IMPLANT
CLOSURE WOUND 1/2 X4 (GAUZE/BANDAGES/DRESSINGS) ×1
CONT SPEC 4OZ CLIKSEAL STRL BL (MISCELLANEOUS) ×3 IMPLANT
COVER PROBE W GEL 5X96 (DRAPES) ×3 IMPLANT
COVER SURGICAL LIGHT HANDLE (MISCELLANEOUS) ×3 IMPLANT
COVER WAND RF STERILE (DRAPES) ×3 IMPLANT
DEVICE DUBIN SPECIMEN MAMMOGRA (MISCELLANEOUS) ×3 IMPLANT
DRAPE CHEST BREAST 15X10 FENES (DRAPES) ×3 IMPLANT
DRAPE UTILITY XL STRL (DRAPES) ×3 IMPLANT
DRSG TEGADERM 4X4.75 (GAUZE/BANDAGES/DRESSINGS) ×6 IMPLANT
ELECT CAUTERY BLADE 6.4 (BLADE) ×3 IMPLANT
ELECT REM PT RETURN 9FT ADLT (ELECTROSURGICAL) ×3
ELECTRODE REM PT RTRN 9FT ADLT (ELECTROSURGICAL) ×1 IMPLANT
GAUZE SPONGE 2X2 8PLY STRL LF (GAUZE/BANDAGES/DRESSINGS) ×1 IMPLANT
GLOVE BIO SURGEON STRL SZ7 (GLOVE) ×3 IMPLANT
GLOVE BIOGEL PI IND STRL 6.5 (GLOVE) IMPLANT
GLOVE BIOGEL PI IND STRL 7.5 (GLOVE) ×1 IMPLANT
GLOVE BIOGEL PI INDICATOR 6.5 (GLOVE) ×2
GLOVE BIOGEL PI INDICATOR 7.5 (GLOVE) ×2
GOWN STRL REUS W/ TWL LRG LVL3 (GOWN DISPOSABLE) ×2 IMPLANT
GOWN STRL REUS W/TWL LRG LVL3 (GOWN DISPOSABLE) ×4
KIT BASIN OR (CUSTOM PROCEDURE TRAY) ×3 IMPLANT
KIT MARKER MARGIN INK (KITS) IMPLANT
LIGHT WAVEGUIDE WIDE FLAT (MISCELLANEOUS) IMPLANT
NDL 18GX1X1/2 (RX/OR ONLY) (NEEDLE) IMPLANT
NDL FILTER BLUNT 18X1 1/2 (NEEDLE) ×1 IMPLANT
NDL HYPO 25GX1X1/2 BEV (NEEDLE) ×2 IMPLANT
NDL SAFETY ECLIPSE 18X1.5 (NEEDLE) ×1 IMPLANT
NEEDLE 18GX1X1/2 (RX/OR ONLY) (NEEDLE) ×3 IMPLANT
NEEDLE FILTER BLUNT 18X 1/2SAF (NEEDLE) ×4
NEEDLE FILTER BLUNT 18X1 1/2 (NEEDLE) ×2 IMPLANT
NEEDLE HYPO 18GX1.5 SHARP (NEEDLE) ×2
NEEDLE HYPO 25GX1X1/2 BEV (NEEDLE) ×9 IMPLANT
NS IRRIG 1000ML POUR BTL (IV SOLUTION) ×3 IMPLANT
PACK SURGICAL SETUP 50X90 (CUSTOM PROCEDURE TRAY) ×3 IMPLANT
PENCIL BUTTON HOLSTER BLD 10FT (ELECTRODE) ×3 IMPLANT
SPONGE GAUZE 2X2 STER 10/PKG (GAUZE/BANDAGES/DRESSINGS) ×2
SPONGE LAP 4X18 RFD (DISPOSABLE) ×3 IMPLANT
STRIP CLOSURE SKIN 1/2X4 (GAUZE/BANDAGES/DRESSINGS) ×2 IMPLANT
SUT MNCRL AB 4-0 PS2 18 (SUTURE) ×8 IMPLANT
SUT VIC AB 3-0 SH 27 (SUTURE) ×6
SUT VIC AB 3-0 SH 27X BRD (SUTURE) ×2 IMPLANT
SYR BULB 3OZ (MISCELLANEOUS) ×3 IMPLANT
SYR CONTROL 10ML LL (SYRINGE) ×8 IMPLANT
TOWEL OR 17X24 6PK STRL BLUE (TOWEL DISPOSABLE) ×3 IMPLANT
TOWEL OR 17X26 10 PK STRL BLUE (TOWEL DISPOSABLE) ×3 IMPLANT
TUBE CONNECTING 12'X1/4 (SUCTIONS) ×1
TUBE CONNECTING 12X1/4 (SUCTIONS) ×2 IMPLANT
YANKAUER SUCT BULB TIP NO VENT (SUCTIONS) ×3 IMPLANT

## 2018-08-03 NOTE — Anesthesia Postprocedure Evaluation (Signed)
Anesthesia Post Note  Patient: Kelly Blackwell  Procedure(s) Performed: RIGHT BREAST LUMPECTOMY WITH RADIOACTIVE SEED AND SENTINEL LYMPH NODE BIOPSY WITH BLUE DYE INJECTION (Right Breast)     Patient location during evaluation: PACU Anesthesia Type: General Level of consciousness: sedated Pain management: pain level controlled Vital Signs Assessment: post-procedure vital signs reviewed and stable Respiratory status: spontaneous breathing and respiratory function stable Cardiovascular status: stable Postop Assessment: no apparent nausea or vomiting Anesthetic complications: no    Last Vitals:  Vitals:   08/03/18 0939 08/03/18 0949  BP: (!) 141/65 (!) 149/83  Pulse: 88 100  Resp: 18   Temp: (!) 36.4 C   SpO2: 99% 98%    Last Pain:  Vitals:   08/03/18 0949  TempSrc:   PainSc: 2                  Adilson Grafton DANIEL

## 2018-08-03 NOTE — Interval H&P Note (Signed)
History and Physical Interval Note:  08/03/2018 7:17 AM  [name]  has presented today for surgery, with the diagnosis of RIGHT BREAST INVASIVE DUCTAL CARCINOMA  The various methods of treatment have been discussed with the patient and family. After consideration of risks, benefits and other options for treatment, the patient has consented to  Procedure(s): RIGHT BREAST LUMPECTOMY WITH RADIOACTIVE SEED AND SENTINEL LYMPH NODE BIOPSY WITH BLUE DYE INJECTION (Right) as a surgical intervention .  The patient's history has been reviewed, patient examined, no change in status, stable for surgery.  I have reviewed the patient's chart and labs.  Questions were answered to the patient's satisfaction.     Kelly Blackwell

## 2018-08-03 NOTE — Anesthesia Procedure Notes (Signed)
Procedure Name: LMA Insertion Date/Time: 08/03/2018 7:36 AM Performed by: Leonor Liv, CRNA Pre-anesthesia Checklist: Patient identified, Emergency Drugs available, Suction available and Patient being monitored Patient Re-evaluated:Patient Re-evaluated prior to induction Oxygen Delivery Method: Circle System Utilized Preoxygenation: Pre-oxygenation with 100% oxygen Induction Type: IV induction Ventilation: Mask ventilation without difficulty LMA: LMA inserted LMA Size: 4.0 Number of attempts: 1 Airway Equipment and Method: Bite block Placement Confirmation: positive ETCO2 Tube secured with: Tape Dental Injury: Teeth and Oropharynx as per pre-operative assessment  Comments: LMA inserted under direct supervision by Yevonne Aline SRNA

## 2018-08-03 NOTE — Transfer of Care (Signed)
Immediate Anesthesia Transfer of Care Note  Patient: Kelly Blackwell  Procedure(s) Performed: RIGHT BREAST LUMPECTOMY WITH RADIOACTIVE SEED AND SENTINEL LYMPH NODE BIOPSY WITH BLUE DYE INJECTION (Right Breast)  Patient Location: PACU  Anesthesia Type:General  Level of Consciousness: sedated  Airway & Oxygen Therapy: Patient Spontanous Breathing and Patient connected to nasal cannula oxygen  Post-op Assessment: Report given to RN, Post -op Vital signs reviewed and stable and Patient moving all extremities X 4  Post vital signs: Reviewed and stable  Last Vitals:  Vitals Value Taken Time  BP 117/55 08/03/2018  9:00 AM  Temp    Pulse 87 08/03/2018  9:03 AM  Resp 14 08/03/2018  9:03 AM  SpO2 100 % 08/03/2018  9:03 AM  Vitals shown include unvalidated device data.  Last Pain:  Vitals:   08/03/18 0900  TempSrc:   PainSc: (P) Asleep      Patients Stated Pain Goal: 0 (47/07/61 [id number])  Complications: No apparent anesthesia complications

## 2018-08-03 NOTE — Anesthesia Procedure Notes (Signed)
Anesthesia Regional Block: Pectoralis block   Pre-Anesthetic Checklist: ,, timeout performed, Correct Patient, Correct Site, Correct Laterality, Correct Procedure, Correct Position, site marked, Risks and benefits discussed,  Surgical consent,  Pre-op evaluation,  At surgeon's request and post-op pain management  Laterality: Right  Prep: chloraprep       Needles:  Injection technique: Single-shot  Needle Type: Echogenic Stimulator Needle     Needle Length: 10cm  Needle Gauge: 21     Additional Needles:   Narrative:  Start time: 08/03/2018 7:12 AM End time: 08/03/2018 7:02 AM Injection made incrementally with aspirations every 5 mL.  Performed by: Personally

## 2018-08-03 NOTE — Discharge Instructions (Signed)
[facility] °Office Phone Number [phone] ° °BREAST BIOPSY/ PARTIAL MASTECTOMY: POST OP INSTRUCTIONS ° °Always review your discharge instruction sheet given to you by the facility where your surgery was performed. ° °IF YOU HAVE DISABILITY OR FAMILY LEAVE FORMS, YOU MUST BRING THEM TO THE OFFICE FOR PROCESSING.  DO NOT GIVE THEM TO YOUR DOCTOR. ° °1. A prescription for pain medication may be given to you upon discharge.  Take your pain medication as prescribed, if needed.  If narcotic pain medicine is not needed, then you may take acetaminophen (Tylenol) or ibuprofen (Advil) as needed. °2. Take your usually prescribed medications unless otherwise directed °3. If you need a refill on your pain medication, please contact your pharmacy.  They will contact our office to request authorization.  Prescriptions will not be filled after 5pm or on week-ends. °4. You should eat very light the first 24 hours after surgery, such as soup, crackers, pudding, etc.  Resume your normal diet the day after surgery. °5. Most patients will experience some swelling and bruising in the breast.  Ice packs and a good support bra will help.  Swelling and bruising can take several days to resolve.  °6. It is common to experience some constipation if taking pain medication after surgery.  Increasing fluid intake and taking a stool softener will usually help or prevent this problem from occurring.  A mild laxative (Milk of Magnesia or Miralax) should be taken according to package directions if there are no bowel movements after 48 hours. °7. Unless discharge instructions indicate otherwise, you may remove your bandages 24-48 hours after surgery, and you may shower at that time.  You may have steri-strips (small skin tapes) in place directly over the incision.  These strips should be left on the skin for 7-10 days.  If your surgeon used skin glue on the incision, you may shower in 24 hours.  The glue will flake off over the  next 2-3 weeks.  Any sutures or staples will be removed at the office during your follow-up visit. °8. ACTIVITIES:  You may resume regular daily activities (gradually increasing) beginning the next day.  Wearing a good support bra or sports bra minimizes pain and swelling.  You may have sexual intercourse when it is comfortable. °a. You may drive when you no longer are taking prescription pain medication, you can comfortably wear a seatbelt, and you can safely maneuver your car and apply brakes. °b. RETURN TO WORK:  ______________________________________________________________________________________ °9. You should see your doctor in the office for a follow-up appointment approximately two weeks after your surgery.  Your doctor’s nurse will typically make your follow-up appointment when she calls you with your pathology report.  Expect your pathology report 2-3 business days after your surgery.  You may call to check if you do not hear from us after three days. °10. OTHER INSTRUCTIONS: _______________________________________________________________________________________________ _____________________________________________________________________________________________________________________________________ °_____________________________________________________________________________________________________________________________________ °_____________________________________________________________________________________________________________________________________ ° °WHEN TO CALL YOUR DOCTOR: °1. Fever over 101.0 °2. Nausea and/or vomiting. °3. Extreme swelling or bruising. °4. Continued bleeding from incision. °5. Increased pain, redness, or drainage from the incision. ° °The clinic staff is available to answer your questions during regular business hours.  Please don’t hesitate to call and ask to speak to one of the nurses for clinical concerns.  If you have a medical emergency, go to the nearest  emergency room or call 911.  A surgeon from [facility] is always on call at the hospital. ° °For further questions, please visit [email or web address]  °

## 2018-08-03 NOTE — Op Note (Signed)
Preop diagnosis: Invasive ductal carcinoma right breast Postop diagnosis: Same Procedure performed: Right radioactive seed localized lumpectomy, right axillary sentinel lymph node biopsy, blue dye injection Surgeon:Tavion Senkbeil K Nykira Reddix Anesthesia: General via LMA and right pec block Indications:  This is a healthy 78 year old female who presents with a recent abnormal mammogram. She underwent diagnostic mammogram and ultrasound of the right upper inner quadrant followed by a biopsy. The biopsy confirmed invasive mammary carcinoma ER/PR positive, HER-2 equivocal (FISH pending), Ki67 - 5%. Ultrasound showed no enlarged lymph nodes. The mass is nonpalpable.  Radioactive seed was placed yesterday by radiology.  The patient was injected in the preoperative area with technetium sulfur colloid for the sentinel lymph node biopsy.  Description of procedure:  The patient is brought to the operating room placed in supine position on the operating room table. After an adequate level of general anesthesia was obtained her right breast and axilla were prepped with ChloraPrep and draped in sterile fashion. A timeout was taken to ensure the proper patient and proper procedure.  I infiltrated the area around the nipple with methylene blue dye solution.  We interrogated the breast with the neoprobe. We made a circumareolar incision around the upper side of the nipple after infiltrating with 0.25% Marcaine. Dissection was carried down in the breast tissue with cautery. We used the neoprobe to guide [place] towards the radioactive seed at 1:00. We excised an area of tissue around the radioactive seed 3 cm diameter. The specimen was removed and was oriented with a paint kit. Specimen mammogram showed the radioactive seed as well as the biopsy clip within the specimen. This was sent for pathologic examination. There is no residual radioactivity within the biopsy cavity. We inspected carefully for hemostasis. The wound was thoroughly  irrigated.   We then changed the settings on the neoprobe.  We interrogated the axilla.  An area of activity was identified.  I made an incision across the axilla over this area.  Dissection was carried down through the subcutaneous tissues into the axillary contents.  The neoprobe was used to guide [place] towards the area of activity.  We excised a total of 4 sentinel lymph nodes.  There was no blue dye noted within these lymph nodes but there was radioactivity.  There is minimal background activity after these 4 lymph nodes were removed.  We irrigated the wound thoroughly and inspected for hemostasis.  Both wounds were closed with a deep layer of 3-0 Vicryl and a subcuticular layer of 4-0 Monocryl. Benzoin and Steri-Strips were applied. The patient was then extubated and brought to the recovery room in stable condition. All sponge, instrument, and needle counts are correct.  Kelly Blackwell. Kelly Dover, MD, [facility]  General/ Trauma Surgery  08/03/2018 8:53 AM

## 2018-08-04 ENCOUNTER — Encounter (HOSPITAL_COMMUNITY): Payer: Self-pay | Admitting: Surgery

## 2018-08-09 NOTE — Progress Notes (Signed)
Patient Care Team: Leanna Battles, MD as PCP - General (Internal Medicine)  DIAGNOSIS:    ICD-10-CM   1. Carcinoma of upper-inner quadrant of right breast in female, estrogen receptor positive ([place]) C50.211    Z17.0     SUMMARY OF ONCOLOGIC HISTORY:   Carcinoma of upper-inner quadrant of right breast in female, estrogen receptor positive ([place])   07/31/2018 Initial Diagnosis    Screening detected right breast mass at 1 o'clock position 1.1 cm, no axillary lymph nodes, biopsy revealed IDC grade 1-2, ER 100%, PR 80%, Ki-67 5%, HER-2 negative, 2+ by IHC and ratio 1.22 by FISH, T1CN0 stage Ia    07/31/2018 Cancer Staging    Staging form: Breast, AJCC 8th Edition - Clinical: Stage IA (cT1c, cN0, cM0, G1, ER+, PR+, HER2-) - Signed by Eppie Gibson, MD on 07/31/2018    08/03/2018 Surgery    Right lumpectomy: IDC 0.6 cm, grade 2, margins negative, 0/4 lymph nodes negative, ER 100%, PR 80%, HER-2 equivocal by IHC, FISH negative ratio 1.95, Ki-67 5%, T1BN0 stage Ia     CHIEF COMPLIANT: Follow-up s/p lumpectomy to review pathology report  INTERVAL HISTORY: Kelly Blackwell is a 78 y.o. with above-mentioned history of right breast cancer. She had a right lumpectomy on 08/03/18 for which pathology showed the 0.6cm tumor to be stage 1a, grade 2 invasive ductal carcinoma, ER 100%, PR 80%, HER2 negative, Ki67 5% with all four lymph nodes negative for carcinoma and margins were not involved. She presents to the clinic alone today and says surgery went well. She has an appointment with Dr. Isidore Moos on 08/22/18 and a bone density scan on 08/29/18. She has a history of osteopenia. She reviewed her medication list with me.   REVIEW OF SYSTEMS:   Constitutional: Denies fevers, chills or abnormal weight loss Eyes: Denies blurriness of vision Ears, nose, mouth, throat, and face: Denies mucositis or sore throat Respiratory: Denies cough, dyspnea or wheezes Cardiovascular: Denies palpitation, chest  discomfort Gastrointestinal:  Denies nausea, heartburn or change in bowel habits Skin: Denies abnormal skin rashes Lymphatics: Denies new lymphadenopathy or easy bruising Neurological: Denies numbness, tingling or new weaknesses Behavioral/Psych: Mood is stable, no new changes  Extremities: No lower extremity edema Breast: Recent right lumpectomy All other systems were reviewed with the patient and are negative.  I have reviewed the past medical history, past surgical history, social history and family history with the patient and they are unchanged from previous note.  ALLERGIES:  is allergic to sulfa antibiotics.  MEDICATIONS:  Current Outpatient Medications  Medication Sig Dispense Refill  . ALPRAZolam (XANAX) 0.25 MG tablet Take 0.25 mg by mouth 2 (two) times daily as needed for anxiety.    Marland Kitchen aspirin EC 81 MG tablet Take 81 mg by mouth daily.    Marland Kitchen atorvastatin (LIPITOR) 10 MG tablet Take 10 mg by mouth daily.     . Calcium Carb-Cholecalciferol (CALCIUM 600 + D PO) Take 1 tablet by mouth 2 (two) times daily.    . cetirizine (ZYRTEC) 10 MG tablet Take 10 mg by mouth daily.    . cholecalciferol (VITAMIN D3) 25 MCG (1000 UT) tablet Take 1,000 Units by mouth daily.    Marland Kitchen co-enzyme Q-10 30 MG capsule Take 100 mg by mouth daily.    . Multiple Vitamins-Minerals (MULTIVITAMIN WITH MINERALS) tablet Take 1 tablet by mouth 3 (three) times a week.    Marland Kitchen omeprazole (PRILOSEC) 20 MG capsule Take 20 mg by mouth daily.    Marland Kitchen  telmisartan-hydrochlorothiazide (MICARDIS HCT) 80-12.5 MG tablet Take 1 tablet by mouth daily.    . vitamin C (ASCORBIC ACID) 500 MG tablet Take 500 mg by mouth daily.     No current facility-administered medications for this visit.     PHYSICAL EXAMINATION: ECOG PERFORMANCE STATUS: 1 - Symptomatic but completely ambulatory  Vitals:   08/10/18 1050  BP: 128/64  Pulse: 76  Resp: 17  Temp: (!) 97.5 F (36.4 C)  SpO2: 100%   Filed Weights   08/10/18 1050  Weight: 121  lb 1.6 oz (54.9 kg)    GENERAL: alert, no distress and comfortable SKIN: skin color, texture, turgor are normal, no rashes or significant lesions EYES: normal, Conjunctiva are pink and non-injected, sclera clear OROPHARYNX: no exudate, no erythema and lips, buccal mucosa, and tongue normal  NECK: supple, thyroid normal size, non-tender, without nodularity LYMPH: no palpable lymphadenopathy in the cervical, axillary or inguinal LUNGS: clear to auscultation and percussion with normal breathing effort HEART: regular rate & rhythm and no murmurs and no lower extremity edema ABDOMEN: abdomen soft, non-tender and normal bowel sounds MUSCULOSKELETAL: no cyanosis of digits and no clubbing  NEURO: alert & oriented x 3 with fluent speech, no focal motor/sensory deficits EXTREMITIES: No lower extremity edema  LABORATORY DATA:  I have reviewed the data as listed CMP Latest Ref Rng & Units 07/31/2018  Glucose 70 - 99 mg/dL 98  BUN 8 - 23 mg/dL 15  Creatinine 0.44 - 1.00 mg/dL 0.83  Sodium 135 - 145 mmol/L 134(L)  Potassium 3.5 - 5.1 mmol/L 3.6  Chloride 98 - 111 mmol/L 100  CO2 22 - 32 mmol/L 27  Calcium 8.9 - 10.3 mg/dL 9.6    Lab Results  Component Value Date   WBC 6.5 07/31/2018   HGB 13.6 07/31/2018   HCT 41.4 07/31/2018   MCV 89.8 07/31/2018   PLT 268 07/31/2018    ASSESSMENT & PLAN:  Carcinoma of upper-inner quadrant of right breast in female, estrogen receptor positive ([place]) 07/17/2018:Screening detected right breast mass at 1 o'clock position 1.1 cm, no axillary lymph nodes, biopsy revealed IDC grade 1-2, ER 100%, PR 80%, Ki-67 5%, HER-2 negative, 2+ by IHC and ratio 1.22 by FISH, T1CN0 stage Ia  08/03/2018:Right lumpectomy: IDC 0.6 cm, grade 2, margins negative, 0/4 lymph nodes negative, ER 100%, PR 80%, HER-2 equivocal by IHC, FISH negative ratio 1.95, Ki-67 5%, T1BN0 stage Ia  Pathology counseling: I discussed the final pathology report of the patient provided  a copy of this  report. I discussed the margins as well as lymph node surgeries. We also discussed the final staging along with previously performed ER/PR and HER-2/neu testing.  Recommendation: 1. radiation therapy: Patient has an appointment next week to see Dr. Isidore Moos. 2.  Followed by adjuvant antiestrogen therapy.  Patient has an appointment for a bone density test on February 11.  We will use that information to make a decision on antiestrogen treatment.  Return to clinic at the end of radiation therapy to start antiestrogen therapy    No orders of the defined types were placed in this encounter.  The patient has a good understanding of the overall plan. she agrees with it. she will call with any problems that may develop before the next visit here.  Nicholas Lose, MD 08/10/2018  Julious Oka Dorshimer am acting as scribe for Dr. Nicholas Lose.  I have reviewed the above documentation for accuracy and completeness, and I agree with the above.

## 2018-08-10 ENCOUNTER — Telehealth: Payer: Self-pay | Admitting: Hematology and Oncology

## 2018-08-10 ENCOUNTER — Inpatient Hospital Stay (HOSPITAL_BASED_OUTPATIENT_CLINIC_OR_DEPARTMENT_OTHER): Payer: PPO | Admitting: Hematology and Oncology

## 2018-08-10 DIAGNOSIS — Z7982 Long term (current) use of aspirin: Secondary | ICD-10-CM

## 2018-08-10 DIAGNOSIS — I10 Essential (primary) hypertension: Secondary | ICD-10-CM

## 2018-08-10 DIAGNOSIS — Z79899 Other long term (current) drug therapy: Secondary | ICD-10-CM | POA: Diagnosis not present

## 2018-08-10 DIAGNOSIS — C50211 Malignant neoplasm of upper-inner quadrant of right female breast: Secondary | ICD-10-CM

## 2018-08-10 DIAGNOSIS — Z170 Estrogen receptor positive status [ER+]: Secondary | ICD-10-CM

## 2018-08-10 NOTE — Assessment & Plan Note (Signed)
07/17/2018:Screening detected right breast mass at 1 o'clock position 1.1 cm, no axillary lymph nodes, biopsy revealed IDC grade 1-2, ER 100%, PR 80%, Ki-67 5%, HER-2 negative, 2+ by IHC and ratio 1.22 by FISH, T1CN0 stage Ia  08/03/2018:Right lumpectomy: IDC 0.6 cm, grade 2, margins negative, 0/4 lymph nodes negative, ER 100%, PR 80%, HER-2 equivocal by IHC, FISH negative ratio 1.95, Ki-67 5%, T1BN0 stage Ia  Pathology counseling: I discussed the final pathology report of the patient provided  a copy of this report. I discussed the margins as well as lymph node surgeries. We also discussed the final staging along with previously performed ER/PR and HER-2/neu testing.  Recommendation: 1.  Plus or minus radiation therapy 2.  Followed by adjuvant antiestrogen therapy  Return to clinic at the end of radiation therapy to start antiestrogen therapy

## 2018-08-10 NOTE — Telephone Encounter (Signed)
No los °

## 2018-08-16 NOTE — Progress Notes (Signed)
Location of Breast Cancer: Right Breast  Histology per Pathology Report:  07/17/18 Diagnosis Breast, right, needle core biopsy, 1 o'clock - INVASIVE MAMMARY CARCINOMA, GRADE I/II. SEE COMMENT.  Receptor Status: ER(100%), PR (80%), Her2-neu (NEG), Ki-(5%)  08/03/18 Diagnosis 1. Breast, lumpectomy, Right with seed - INVASIVE DUCTAL CARCINOMA, 0.6 CM, NOTTINGHAM GRADE 2 OF 3. - MARGINS OF RESECTIONS ARE NOT INVOLVED (CLOSEST MARGIN: 5 MM, SUPERIOR/LATERAL). - BIOPSY SITE CHANGES. - SEE ONCOLOGY TABLE. 2. Lymph node, sentinel, biopsy, Right axillary #1 - ONE LYMPH NODE, NEGATIVE FOR CARCINOMA (0/1). 3. Lymph node, sentinel, biopsy, Right axillary #2 - ONE LYMPH NODE, NEGATIVE FOR CARCINOMA (0/1). 4. Lymph node, sentinel, biopsy, Right axillary #3 - ONE LYMPH NODE, NEGATIVE FOR CARCINOMA (0/1). 5. Lymph node, sentinel, biopsy, Right axillary #4 - ONE LYMPH NODE, NEGATIVE FOR CARCINOMA (0/1).  Did patient present with symptoms or was this found on screening mammography?: It was discovered on a screening mammogram.   Past/Anticipated interventions by surgeon, if any: 08/03/18 Procedure performed: Right radioactive seed localized lumpectomy, right axillary sentinel lymph node biopsy, blue dye injection Surgeon:Matthew K Tsuei   Past/Anticipated interventions by medical oncology, if any:  Dr. Lindi Adie 08/10/18 Recommendation: 1. radiation therapy: Patient has an appointment next week to see Dr. Isidore Moos. 2.  Followed by adjuvant antiestrogen therapy.  Patient has an appointment for a bone density test on February 11.  We will use that information to make a decision on antiestrogen treatment. Return to clinic at the end of radiation therapy to start antiestrogen therapy  Lymphedema issues, if any: She denies. She does have minor edema to her lateral right breast.   Pain issues, if any:  She reports skin sensitivity to her lateral right breast   SAFETY ISSUES:  Prior radiation?  No  Pacemaker/ICD? No  Possible current pregnancy? No  Is the patient on methotrexate? No  Current Complaints / other details:  BP (!) 149/66 (BP Location: Left Arm)   Pulse 68   Temp 98.2 F (36.8 C) (Oral)   Ht 5' 1.5" (1.562 m)   Wt 121 lb 12.8 oz (55.2 kg)   SpO2 100% Comment: room air  BMI 22.64 kg/m    Wt Readings from Last 3 Encounters:  08/22/18 121 lb 12.8 oz (55.2 kg)  08/10/18 121 lb 1.6 oz (54.9 kg)  08/03/18 124 lb (56.2 kg)

## 2018-08-22 ENCOUNTER — Encounter: Payer: Self-pay | Admitting: Hematology and Oncology

## 2018-08-22 ENCOUNTER — Ambulatory Visit: Payer: PPO

## 2018-08-22 ENCOUNTER — Encounter: Payer: Self-pay | Admitting: Radiation Oncology

## 2018-08-22 ENCOUNTER — Other Ambulatory Visit: Payer: Self-pay

## 2018-08-22 ENCOUNTER — Ambulatory Visit
Admission: RE | Admit: 2018-08-22 | Discharge: 2018-08-22 | Disposition: A | Discharge status: Home / Self Care | Payer: PPO | Source: Ambulatory Visit | Attending: Radiation Oncology | Admitting: Radiation Oncology

## 2018-08-22 VITALS — BP 149/66 | HR 68 | Temp 98.2°F | Ht 61.5 in | Wt 121.8 lb

## 2018-08-22 DIAGNOSIS — C50211 Malignant neoplasm of upper-inner quadrant of right female breast: Secondary | ICD-10-CM | POA: Diagnosis not present

## 2018-08-22 DIAGNOSIS — Z79899 Other long term (current) drug therapy: Secondary | ICD-10-CM | POA: Diagnosis not present

## 2018-08-22 DIAGNOSIS — Z170 Estrogen receptor positive status [ER+]: Secondary | ICD-10-CM | POA: Insufficient documentation

## 2018-08-22 DIAGNOSIS — Z7982 Long term (current) use of aspirin: Secondary | ICD-10-CM | POA: Insufficient documentation

## 2018-08-22 DIAGNOSIS — Z9889 Other specified postprocedural states: Secondary | ICD-10-CM | POA: Diagnosis not present

## 2018-08-22 NOTE — Progress Notes (Signed)
Radiation Oncology         [phone] ________________________________  Name: Kelly Blackwell MRN: [id number]  Date: 08/22/2018  DOB: 04-19-1941  Follow-Up Visit Note  Outpatient  CC: Leanna Battles, MD  Leanna Battles, MD  Diagnosis:      ICD-10-CM   1. Carcinoma of upper-inner quadrant of right breast in female, estrogen receptor positive ([place]) C50.211    Z17.0      Cancer Staging Carcinoma of upper-inner quadrant of right breast in female, estrogen receptor positive ([place]) Staging form: Breast, AJCC 8th Edition - Clinical: Stage IA (cT1c, cN0, cM0, G1, ER+, PR+, HER2-) - Signed by Eppie Gibson, MD on 07/31/2018   CHIEF COMPLAINT: Here to discuss management of right breast cancer  Narrative:  The patient returns today for follow-up to discuss radiation treatment options.    She opted to proceed with right lumpectomy with nodal biopsies on date of 08/03/2018 with pathology report revealing: tumor size of 0.6 cm; histology of invasive ductal carcinoma; margin status to invasive disease of 5 mm; nodal status of negative, 0/4; ER status: 100% positive; PR status 80% positive, Her2 status negative (by [place]); Grade 2.  She last saw Dr. Lindi Adie on 08/10/2018, and she will begin antiestrogen therapy following radiation therapy.  Symptomatically, the patient reports: minor edema and skin sensitivity to her lateral right breast.         ALLERGIES:  is allergic to sulfa antibiotics.  Meds: Current Outpatient Medications  Medication Sig Dispense Refill  . ALPRAZolam (XANAX) 0.25 MG tablet Take 0.25 mg by mouth 2 (two) times daily as needed for anxiety.    Marland Kitchen aspirin EC 81 MG tablet Take 81 mg by mouth daily.    Marland Kitchen atorvastatin (LIPITOR) 10 MG tablet Take 10 mg by mouth daily.     . Calcium Carb-Cholecalciferol (CALCIUM 600 + D PO) Take 1 tablet by mouth 2 (two) times daily.    . cetirizine (ZYRTEC) 10 MG tablet Take 10 mg by mouth daily.    . cholecalciferol (VITAMIN D3) 25 MCG  (1000 UT) tablet Take 1,000 Units by mouth daily.    Marland Kitchen co-enzyme Q-10 30 MG capsule Take 100 mg by mouth daily.    . Multiple Vitamins-Minerals (MULTIVITAMIN WITH MINERALS) tablet Take 1 tablet by mouth 3 (three) times a week.    Marland Kitchen omeprazole (PRILOSEC) 20 MG capsule Take 20 mg by mouth daily.    Marland Kitchen telmisartan-hydrochlorothiazide (MICARDIS HCT) 80-12.5 MG tablet Take 1 tablet by mouth daily.    . vitamin C (ASCORBIC ACID) 500 MG tablet Take 500 mg by mouth daily.    . meloxicam (MOBIC) 15 MG tablet      No current facility-administered medications for this encounter.     Physical Findings:  height is 5' 1.5" (1.562 m) and weight is 121 lb 12.8 oz (55.2 kg). Her oral temperature is 98.2 F (36.8 C). Her blood pressure is 149/66 (abnormal) and her pulse is 68. Her oxygen saturation is 100%. .     General: Alert and oriented, in no acute distress HEENT: Head is normocephalic. Extraocular movements are intact.  Neurologic: No obvious focalities. Speech is fluent.  Psychiatric: Judgment and insight are intact. Affect is appropriate. Breast exam reveals a moderate seroma in the right breast with modest swelling of the right breast and axilla.  Lab Findings: Lab Results  Component Value Date   WBC 6.5 07/31/2018   HGB 13.6 07/31/2018   HCT 41.4 07/31/2018   MCV 89.8 07/31/2018  PLT 268 07/31/2018    _0 @  Radiographic Findings: Nm Sentinel Node Inj-no Rpt (breast)  Result Date: 08/03/2018 Sulfur colloid was injected by the nuclear medicine technologist for melanoma sentinel node.   Mm Breast Surgical Specimen  Result Date: 08/03/2018 CLINICAL DATA:  Evaluate surgical specimen following lumpectomy for RIGHT breast cancer. EXAM: SPECIMEN RADIOGRAPH OF THE RIGHT BREAST COMPARISON:  Previous exam(s). FINDINGS: Status post excision of the RIGHT breast. The radioactive seed and biopsy marker clip are present, completely intact, and were marked for pathology. IMPRESSION:  Specimen radiograph of the RIGHT breast. Electronically Signed   By: Margarette Canada M.D.   On: 08/03/2018 08:20   Mm Rt Radioactive Seed Loc Mammo Guide  Result Date: 08/02/2018 CLINICAL DATA:  78 year old female for radioactive seed localization of RIGHT breast cancer. EXAM: MAMMOGRAPHIC GUIDED RADIOACTIVE SEED LOCALIZATION OF THE RIGHT BREAST COMPARISON:  Previous exam(s). FINDINGS: Patient presents for radioactive seed localization prior to RIGHT lumpectomy. I met with the patient and we discussed the procedure of seed localization including benefits and alternatives. We discussed the high likelihood of a successful procedure. We discussed the risks of the procedure including infection, bleeding, tissue injury and further surgery. We discussed the low dose of radioactivity involved in the procedure. Informed, written consent was given. The usual time-out protocol was performed immediately prior to the procedure. Using mammographic guidance, sterile technique, 1% lidocaine and an I-125 radioactive seed, the RIBBON clip was localized using a SUPERIOR approach. The follow-up mammogram images confirm the seed in the expected location and were marked for Dr. Georgette Dover. Follow-up survey of the patient confirms presence of the radioactive seed. Order number of I-125 seed:  [id number]. Total activity: 0.255 mCi.  Reference Date: 07/14/18. The patient tolerated the procedure well and was [facility] from [facility]. She was given instructions regarding seed removal. IMPRESSION: Radioactive seed localization RIGHT breast. No apparent complications. Electronically Signed   By: Margarette Canada M.D.   On: 08/02/2018 13:27    Impression/Plan: Right Breast IDC, Stage I, ER+  We discussed adjuvant radiotherapy again today.  I recommend 4 weeks directed at the right breast in order to reduce locoregional recurrence by 2/3.  The risks, benefits and side effects of this treatment were discussed in detail.  She understands that  radiotherapy is associated with skin irritation and fatigue in the acute setting. Late effects can include cosmetic changes and rare injury to internal organs.  She is enthusiastic about proceeding with treatment. A consent form was signed at her last visit.  I recommend pushing back her CT simulation by a couple weeks due to being too close to her lumpectomy procedure. She has some swelling remaining. We rescheduled her today.  I spent 20 minutes minutes face to face with the patient and more than 50% of that time was spent in counseling and/or coordination of care. _____________________________________   Eppie Gibson, MD   This document serves as a record of services personally performed by Eppie Gibson, MD. It was created on her behalf by Wilburn Mylar, a trained medical scribe. The creation of this record is based on the scribe's personal observations and the provider's statements to them. This document has been checked and approved by the attending provider.

## 2018-08-29 ENCOUNTER — Ambulatory Visit
Admission: RE | Admit: 2018-08-29 | Discharge: 2018-08-29 | Disposition: A | Discharge status: Home / Self Care | Payer: PPO | Source: Ambulatory Visit | Attending: Obstetrics and Gynecology | Admitting: Obstetrics and Gynecology

## 2018-08-29 DIAGNOSIS — M8589 Other specified disorders of bone density and structure, multiple sites: Secondary | ICD-10-CM | POA: Diagnosis not present

## 2018-08-29 DIAGNOSIS — M8580 Other specified disorders of bone density and structure, unspecified site: Secondary | ICD-10-CM

## 2018-08-29 DIAGNOSIS — Z780 Asymptomatic menopausal state: Secondary | ICD-10-CM | POA: Diagnosis not present

## 2018-09-05 DIAGNOSIS — Z961 Presence of intraocular lens: Secondary | ICD-10-CM | POA: Diagnosis not present

## 2018-09-05 DIAGNOSIS — H35371 Puckering of macula, right eye: Secondary | ICD-10-CM | POA: Diagnosis not present

## 2018-09-05 DIAGNOSIS — H524 Presbyopia: Secondary | ICD-10-CM | POA: Diagnosis not present

## 2018-09-05 DIAGNOSIS — H52223 Regular astigmatism, bilateral: Secondary | ICD-10-CM | POA: Diagnosis not present

## 2018-09-06 ENCOUNTER — Ambulatory Visit
Admission: RE | Admit: 2018-09-06 | Discharge: 2018-09-06 | Disposition: A | Discharge status: Home / Self Care | Payer: PPO | Source: Ambulatory Visit | Attending: Radiation Oncology | Admitting: Radiation Oncology

## 2018-09-06 DIAGNOSIS — Z510 Encounter for antineoplastic radiation therapy: Secondary | ICD-10-CM | POA: Diagnosis not present

## 2018-09-06 DIAGNOSIS — Z170 Estrogen receptor positive status [ER+]: Secondary | ICD-10-CM | POA: Diagnosis not present

## 2018-09-06 DIAGNOSIS — C50211 Malignant neoplasm of upper-inner quadrant of right female breast: Secondary | ICD-10-CM | POA: Insufficient documentation

## 2018-09-06 NOTE — Progress Notes (Signed)
  Radiation Oncology         [phone] ________________________________  Name: Kelly Blackwell MRN: [id number]  Date: 09/06/2018  DOB: 12/31/1940  SIMULATION AND TREATMENT PLANNING NOTE    outpatient  DIAGNOSIS:     ICD-10-CM   1. Carcinoma of upper-inner quadrant of right breast in female, estrogen receptor positive ([place]) C50.211    Z17.0     NARRATIVE:  The patient was brought to the [facility].  Identity was confirmed.  All relevant records and images related to the planned course of therapy were reviewed.  The patient freely provided informed written consent to proceed with treatment after reviewing the details related to the planned course of therapy. The consent form was witnessed and verified by the simulation staff.    Then, the patient was set-up in a stable reproducible supine position for radiation therapy with her ipsilateral arm over her head, and her upper body secured in a custom-made Vac-lok device.  CT images were obtained.  Surface markings were placed.  The CT images were loaded into the planning software.    TREATMENT PLANNING NOTE: Treatment planning then occurred.  The radiation prescription was entered and confirmed.     A total of 3 medically necessary complex treatment devices were fabricated and supervised by me: 2 fields with MLCs for custom blocks to protect heart, and lungs;  and, a Vac-lok. MORE COMPLEX DEVICES MAY BE MADE IN DOSIMETRY FOR FIELD IN FIELD BEAMS FOR DOSE HOMOGENEITY.  I have requested : 3D Simulation which is medically necessary to give adequate dose to at risk tissues while sparing lungs and heart.  I have requested a DVH of the following structures: lungs, heart, right lumpectomy cavity.    The patient will receive 40.05 Gy in 15 fractions to the right breast with 2 tangential fields.  This will  be followed by a boost.  Optical Surface Tracking Plan:  Since intensity modulated radiotherapy (IMRT) and 3D conformal radiation  treatment methods are predicated on accurate and precise positioning for treatment, intrafraction motion monitoring is medically necessary to ensure accurate and safe treatment delivery. The ability to quantify intrafraction motion without excessive ionizing radiation dose can only be performed with optical surface tracking. Accordingly, surface imaging offers the opportunity to obtain 3D measurements of patient position throughout IMRT and 3D treatments without excessive radiation exposure. I am ordering optical surface tracking for this patient's upcoming course of radiotherapy.  ________________________________   Reference:  Ursula Alert, J, et al. Surface imaging-based analysis of intrafraction motion for breast radiotherapy patients.Journal of [place], n. 6, nov. 2014. ISSN [id number].  Available at: <http://www.jacmp.org/index.php/jacmp/article/view/4957>.    -----------------------------------  Eppie Gibson, MD

## 2018-09-07 ENCOUNTER — Telehealth: Payer: Self-pay | Admitting: Hematology and Oncology

## 2018-09-07 NOTE — Telephone Encounter (Signed)
Scheduled appt per 2/20 sch message - pt is aware of apt date and time

## 2018-09-08 DIAGNOSIS — C50211 Malignant neoplasm of upper-inner quadrant of right female breast: Secondary | ICD-10-CM | POA: Diagnosis not present

## 2018-09-08 DIAGNOSIS — Z510 Encounter for antineoplastic radiation therapy: Secondary | ICD-10-CM | POA: Diagnosis not present

## 2018-09-14 ENCOUNTER — Ambulatory Visit
Admission: RE | Admit: 2018-09-14 | Discharge: 2018-09-14 | Disposition: A | Discharge status: Home / Self Care | Payer: PPO | Source: Ambulatory Visit | Attending: Radiation Oncology | Admitting: Radiation Oncology

## 2018-09-14 DIAGNOSIS — Z510 Encounter for antineoplastic radiation therapy: Secondary | ICD-10-CM | POA: Diagnosis not present

## 2018-09-14 DIAGNOSIS — C50211 Malignant neoplasm of upper-inner quadrant of right female breast: Secondary | ICD-10-CM | POA: Diagnosis not present

## 2018-09-15 ENCOUNTER — Ambulatory Visit
Admission: RE | Admit: 2018-09-15 | Discharge: 2018-09-15 | Disposition: A | Discharge status: Home / Self Care | Payer: PPO | Source: Ambulatory Visit | Attending: Radiation Oncology | Admitting: Radiation Oncology

## 2018-09-15 DIAGNOSIS — C50211 Malignant neoplasm of upper-inner quadrant of right female breast: Secondary | ICD-10-CM | POA: Diagnosis not present

## 2018-09-15 DIAGNOSIS — Z510 Encounter for antineoplastic radiation therapy: Secondary | ICD-10-CM | POA: Diagnosis not present

## 2018-09-18 ENCOUNTER — Ambulatory Visit
Admission: RE | Admit: 2018-09-18 | Discharge: 2018-09-18 | Disposition: A | Discharge status: Home / Self Care | Payer: PPO | Source: Ambulatory Visit | Attending: Radiation Oncology | Admitting: Radiation Oncology

## 2018-09-18 DIAGNOSIS — Z510 Encounter for antineoplastic radiation therapy: Secondary | ICD-10-CM | POA: Diagnosis not present

## 2018-09-18 DIAGNOSIS — Z170 Estrogen receptor positive status [ER+]: Secondary | ICD-10-CM | POA: Diagnosis not present

## 2018-09-18 DIAGNOSIS — C50211 Malignant neoplasm of upper-inner quadrant of right female breast: Secondary | ICD-10-CM | POA: Diagnosis not present

## 2018-09-18 MED ORDER — RADIAPLEXRX EX GEL
Freq: Once | CUTANEOUS | Status: AC
  Administered 2018-09-18: 19:00:00 via TOPICAL

## 2018-09-18 MED ORDER — ALRA NON-METALLIC DEODORANT (RAD-ONC)
1.0000 "application " | Freq: Once | TOPICAL | Status: AC
  Administered 2018-09-18: 1 via TOPICAL

## 2018-09-18 NOTE — Progress Notes (Signed)

## 2018-09-19 ENCOUNTER — Ambulatory Visit
Admission: RE | Admit: 2018-09-19 | Discharge: 2018-09-19 | Disposition: A | Discharge status: Home / Self Care | Payer: PPO | Source: Ambulatory Visit | Attending: Radiation Oncology | Admitting: Radiation Oncology

## 2018-09-19 DIAGNOSIS — Z510 Encounter for antineoplastic radiation therapy: Secondary | ICD-10-CM | POA: Diagnosis not present

## 2018-09-19 DIAGNOSIS — C50211 Malignant neoplasm of upper-inner quadrant of right female breast: Secondary | ICD-10-CM | POA: Diagnosis not present

## 2018-09-20 ENCOUNTER — Ambulatory Visit
Admission: RE | Admit: 2018-09-20 | Discharge: 2018-09-20 | Disposition: A | Discharge status: Home / Self Care | Payer: PPO | Source: Ambulatory Visit | Attending: Radiation Oncology | Admitting: Radiation Oncology

## 2018-09-20 DIAGNOSIS — C50211 Malignant neoplasm of upper-inner quadrant of right female breast: Secondary | ICD-10-CM | POA: Diagnosis not present

## 2018-09-20 DIAGNOSIS — Z510 Encounter for antineoplastic radiation therapy: Secondary | ICD-10-CM | POA: Diagnosis not present

## 2018-09-21 ENCOUNTER — Ambulatory Visit
Admission: RE | Admit: 2018-09-21 | Discharge: 2018-09-21 | Disposition: A | Discharge status: Home / Self Care | Payer: PPO | Source: Ambulatory Visit | Attending: Radiation Oncology | Admitting: Radiation Oncology

## 2018-09-21 DIAGNOSIS — Z510 Encounter for antineoplastic radiation therapy: Secondary | ICD-10-CM | POA: Diagnosis not present

## 2018-09-21 DIAGNOSIS — C50211 Malignant neoplasm of upper-inner quadrant of right female breast: Secondary | ICD-10-CM | POA: Diagnosis not present

## 2018-09-22 ENCOUNTER — Ambulatory Visit
Admission: RE | Admit: 2018-09-22 | Discharge: 2018-09-22 | Disposition: A | Discharge status: Home / Self Care | Payer: PPO | Source: Ambulatory Visit | Attending: Radiation Oncology | Admitting: Radiation Oncology

## 2018-09-22 DIAGNOSIS — C50211 Malignant neoplasm of upper-inner quadrant of right female breast: Secondary | ICD-10-CM | POA: Diagnosis not present

## 2018-09-22 DIAGNOSIS — Z510 Encounter for antineoplastic radiation therapy: Secondary | ICD-10-CM | POA: Diagnosis not present

## 2018-09-25 ENCOUNTER — Ambulatory Visit
Admission: RE | Admit: 2018-09-25 | Discharge: 2018-09-25 | Disposition: A | Discharge status: Home / Self Care | Payer: PPO | Source: Ambulatory Visit | Attending: Radiation Oncology | Admitting: Radiation Oncology

## 2018-09-25 DIAGNOSIS — Z510 Encounter for antineoplastic radiation therapy: Secondary | ICD-10-CM | POA: Diagnosis not present

## 2018-09-25 DIAGNOSIS — C50211 Malignant neoplasm of upper-inner quadrant of right female breast: Secondary | ICD-10-CM | POA: Diagnosis not present

## 2018-09-26 ENCOUNTER — Ambulatory Visit
Admission: RE | Admit: 2018-09-26 | Discharge: 2018-09-26 | Disposition: A | Discharge status: Home / Self Care | Payer: PPO | Source: Ambulatory Visit | Attending: Radiation Oncology | Admitting: Radiation Oncology

## 2018-09-26 DIAGNOSIS — Z510 Encounter for antineoplastic radiation therapy: Secondary | ICD-10-CM | POA: Diagnosis not present

## 2018-09-26 DIAGNOSIS — C50211 Malignant neoplasm of upper-inner quadrant of right female breast: Secondary | ICD-10-CM | POA: Diagnosis not present

## 2018-09-27 ENCOUNTER — Other Ambulatory Visit: Payer: Self-pay

## 2018-09-27 ENCOUNTER — Ambulatory Visit
Admission: RE | Admit: 2018-09-27 | Discharge: 2018-09-27 | Disposition: A | Discharge status: Home / Self Care | Payer: PPO | Source: Ambulatory Visit | Attending: Radiation Oncology | Admitting: Radiation Oncology

## 2018-09-27 DIAGNOSIS — Z510 Encounter for antineoplastic radiation therapy: Secondary | ICD-10-CM | POA: Diagnosis not present

## 2018-09-27 DIAGNOSIS — C50211 Malignant neoplasm of upper-inner quadrant of right female breast: Secondary | ICD-10-CM | POA: Diagnosis not present

## 2018-09-28 ENCOUNTER — Other Ambulatory Visit: Payer: Self-pay

## 2018-09-28 ENCOUNTER — Ambulatory Visit
Admission: RE | Admit: 2018-09-28 | Discharge: 2018-09-28 | Disposition: A | Discharge status: Home / Self Care | Payer: PPO | Source: Ambulatory Visit | Attending: Radiation Oncology | Admitting: Radiation Oncology

## 2018-09-28 DIAGNOSIS — C50211 Malignant neoplasm of upper-inner quadrant of right female breast: Secondary | ICD-10-CM | POA: Diagnosis not present

## 2018-09-28 DIAGNOSIS — Z510 Encounter for antineoplastic radiation therapy: Secondary | ICD-10-CM | POA: Diagnosis not present

## 2018-09-29 ENCOUNTER — Other Ambulatory Visit: Payer: Self-pay

## 2018-09-29 ENCOUNTER — Ambulatory Visit
Admission: RE | Admit: 2018-09-29 | Discharge: 2018-09-29 | Disposition: A | Discharge status: Home / Self Care | Payer: PPO | Source: Ambulatory Visit | Attending: Radiation Oncology | Admitting: Radiation Oncology

## 2018-09-29 DIAGNOSIS — C50211 Malignant neoplasm of upper-inner quadrant of right female breast: Secondary | ICD-10-CM | POA: Diagnosis not present

## 2018-09-29 DIAGNOSIS — Z510 Encounter for antineoplastic radiation therapy: Secondary | ICD-10-CM | POA: Diagnosis not present

## 2018-10-02 ENCOUNTER — Ambulatory Visit
Admission: RE | Admit: 2018-10-02 | Discharge: 2018-10-02 | Disposition: A | Discharge status: Home / Self Care | Payer: PPO | Source: Ambulatory Visit | Attending: Radiation Oncology | Admitting: Radiation Oncology

## 2018-10-02 ENCOUNTER — Other Ambulatory Visit: Payer: Self-pay

## 2018-10-02 DIAGNOSIS — C50211 Malignant neoplasm of upper-inner quadrant of right female breast: Secondary | ICD-10-CM | POA: Diagnosis not present

## 2018-10-02 DIAGNOSIS — Z510 Encounter for antineoplastic radiation therapy: Secondary | ICD-10-CM | POA: Diagnosis not present

## 2018-10-03 ENCOUNTER — Ambulatory Visit
Admission: RE | Admit: 2018-10-03 | Discharge: 2018-10-03 | Disposition: A | Discharge status: Home / Self Care | Payer: PPO | Source: Ambulatory Visit | Attending: Radiation Oncology | Admitting: Radiation Oncology

## 2018-10-03 ENCOUNTER — Other Ambulatory Visit: Payer: Self-pay

## 2018-10-03 DIAGNOSIS — C50211 Malignant neoplasm of upper-inner quadrant of right female breast: Secondary | ICD-10-CM | POA: Diagnosis not present

## 2018-10-03 DIAGNOSIS — Z510 Encounter for antineoplastic radiation therapy: Secondary | ICD-10-CM | POA: Diagnosis not present

## 2018-10-04 ENCOUNTER — Other Ambulatory Visit: Payer: Self-pay

## 2018-10-04 ENCOUNTER — Ambulatory Visit
Admission: RE | Admit: 2018-10-04 | Discharge: 2018-10-04 | Disposition: A | Discharge status: Home / Self Care | Payer: PPO | Source: Ambulatory Visit | Attending: Radiation Oncology | Admitting: Radiation Oncology

## 2018-10-04 DIAGNOSIS — Z510 Encounter for antineoplastic radiation therapy: Secondary | ICD-10-CM | POA: Diagnosis not present

## 2018-10-04 DIAGNOSIS — C50211 Malignant neoplasm of upper-inner quadrant of right female breast: Secondary | ICD-10-CM | POA: Diagnosis not present

## 2018-10-05 ENCOUNTER — Other Ambulatory Visit: Payer: Self-pay

## 2018-10-05 ENCOUNTER — Ambulatory Visit
Admission: RE | Admit: 2018-10-05 | Discharge: 2018-10-05 | Disposition: A | Discharge status: Home / Self Care | Payer: PPO | Source: Ambulatory Visit | Attending: Radiation Oncology | Admitting: Radiation Oncology

## 2018-10-05 DIAGNOSIS — Z510 Encounter for antineoplastic radiation therapy: Secondary | ICD-10-CM | POA: Diagnosis not present

## 2018-10-05 NOTE — Progress Notes (Signed)
Patient Care Team: Leanna Battles, MD as PCP - General (Internal Medicine)  DIAGNOSIS:    ICD-10-CM   1. Carcinoma of upper-inner quadrant of right breast in female, estrogen receptor positive ([place]) C50.211    Z17.0     SUMMARY OF ONCOLOGIC HISTORY:   Carcinoma of upper-inner quadrant of right breast in female, estrogen receptor positive ([place])   07/31/2018 Initial Diagnosis    Screening detected right breast mass at 1 o'clock position 1.1 cm, no axillary lymph nodes, biopsy revealed IDC grade 1-2, ER 100%, PR 80%, Ki-67 5%, HER-2 negative, 2+ by IHC and ratio 1.22 by FISH, T1CN0 stage Ia    07/31/2018 Cancer Staging    Staging form: Breast, AJCC 8th Edition - Clinical: Stage IA (cT1c, cN0, cM0, G1, ER+, PR+, HER2-) - Signed by Eppie Gibson, MD on 07/31/2018    08/03/2018 Surgery    Right lumpectomy: IDC 0.6 cm, grade 2, margins negative, 0/4 lymph nodes negative, ER 100%, PR 80%, HER-2 equivocal by IHC, FISH negative ratio 1.95, Ki-67 5%, T1BN0 stage Ia    09/15/2018 - 10/10/2018 Radiation Therapy    Adjuvant radiation with Dr. Isidore Moos    10/10/2018 -  Anti-estrogen oral therapy    Anastrozole, 54m daily, plan for 5 years     CHIEF COMPLIANT: Follow-up after radiation to begin antiestrogen therapy  INTERVAL HISTORY: Kelly TALLis a 78y.o. with above-mentioned history of right breast cancer treated with lumpectomy and adjuvant radiation. She had a bone density scan on 08/29/18 that showed a T-score of -2.0. She presents to the clinic alone today. She tolerated radiation well and denies fatigue, although she reports radiation dermatitis and occasional itching at the radiation site. She is willing to participate in our [place] [place]. She reviewed her medication list with me.   REVIEW OF SYSTEMS:   Constitutional: Denies fevers, chills or abnormal weight loss Eyes: Denies blurriness of vision Ears, nose, mouth, throat, and face: Denies mucositis or sore throat  Respiratory: Denies cough, dyspnea or wheezes Cardiovascular: Denies palpitation, chest discomfort Gastrointestinal: Denies nausea, heartburn or change in bowel habits Skin: (+) radiation dermatitis Lymphatics: Denies new lymphadenopathy or easy bruising Neurological: Denies numbness, tingling or new weaknesses Behavioral/Psych: Mood is stable, no new changes  Extremities: No lower extremity edema Breast: denies any pain or lumps or nodules in either breasts All other systems were reviewed with the patient and are negative.  I have reviewed the past medical history, past surgical history, social history and family history with the patient and they are unchanged from previous note.  ALLERGIES:  is allergic to sulfa antibiotics.  MEDICATIONS:  Current Outpatient Medications  Medication Sig Dispense Refill  . ALPRAZolam (XANAX) 0.25 MG tablet Take 0.25 mg by mouth 2 (two) times daily as needed for anxiety.    .Marland Kitchenanastrozole (ARIMIDEX) 1 MG tablet Take 1 tablet (1 mg total) by mouth daily. 90 tablet 3  . aspirin EC 81 MG tablet Take 81 mg by mouth daily.    .Marland Kitchenatorvastatin (LIPITOR) 10 MG tablet Take 10 mg by mouth daily.     . Calcium Carb-Cholecalciferol (CALCIUM 600 + D PO) Take 1 tablet by mouth 2 (two) times daily.    . cetirizine (ZYRTEC) 10 MG tablet Take 10 mg by mouth daily.    . cholecalciferol (VITAMIN D3) 25 MCG (1000 UT) tablet Take 1,000 Units by mouth daily.    .Marland Kitchenco-enzyme Q-10 30 MG capsule Take 100 mg by mouth daily.    .Marland Kitchen  meloxicam (MOBIC) 15 MG tablet     . Multiple Vitamins-Minerals (MULTIVITAMIN WITH MINERALS) tablet Take 1 tablet by mouth 3 (three) times a week.    Marland Kitchen omeprazole (PRILOSEC) 20 MG capsule Take 20 mg by mouth daily.    Marland Kitchen telmisartan-hydrochlorothiazide (MICARDIS HCT) 80-12.5 MG tablet Take 1 tablet by mouth daily.    . vitamin C (ASCORBIC ACID) 500 MG tablet Take 500 mg by mouth daily.     No current facility-administered medications for this visit.      PHYSICAL EXAMINATION: ECOG PERFORMANCE STATUS: 1 - Symptomatic but completely ambulatory  Vitals:   10/10/18 1401  BP: 126/72  Pulse: 69  Resp: 17  Temp: (!) 97.5 F (36.4 C)  SpO2: 100%   Filed Weights   10/10/18 1401  Weight: 122 lb 6.4 oz (55.5 kg)    GENERAL: alert, no distress and comfortable SKIN: skin color, texture, turgor are normal, no rashes or significant lesions EYES: normal, Conjunctiva are pink and non-injected, sclera clear OROPHARYNX: no exudate, no erythema and lips, buccal mucosa, and tongue normal  NECK: supple, thyroid normal size, non-tender, without nodularity LYMPH: no palpable lymphadenopathy in the cervical, axillary or inguinal LUNGS: clear to auscultation and percussion with normal breathing effort HEART: regular rate & rhythm and no murmurs and no lower extremity edema ABDOMEN: abdomen soft, non-tender and normal bowel sounds MUSCULOSKELETAL: no cyanosis of digits and no clubbing  NEURO: alert & oriented x 3 with fluent speech, no focal motor/sensory deficits EXTREMITIES: No lower extremity edema  LABORATORY DATA:  I have reviewed the data as listed CMP Latest Ref Rng & Units 07/31/2018  Glucose 70 - 99 mg/dL 98  BUN 8 - 23 mg/dL 15  Creatinine 0.44 - 1.00 mg/dL 0.83  Sodium 135 - 145 mmol/L 134(L)  Potassium 3.5 - 5.1 mmol/L 3.6  Chloride 98 - 111 mmol/L 100  CO2 22 - 32 mmol/L 27  Calcium 8.9 - 10.3 mg/dL 9.6    Lab Results  Component Value Date   WBC 6.5 07/31/2018   HGB 13.6 07/31/2018   HCT 41.4 07/31/2018   MCV 89.8 07/31/2018   PLT 268 07/31/2018    ASSESSMENT & PLAN:  Carcinoma of upper-inner quadrant of right breast in female, estrogen receptor positive ([place]) 07/17/2018:Screening detected right breast mass at 1 o'clock position 1.1 cm, no axillary lymph nodes, biopsy revealed IDC grade 1-2, ER 100%, PR 80%, Ki-67 5%, HER-2 negative, 2+ by IHC and ratio 1.22 by FISH, T1CN0 stage Ia  08/03/2018:Right lumpectomy: IDC 0.6 cm,  grade 2, margins negative, 0/4 lymph nodes negative, ER 100%, PR 80%, HER-2 equivocal by IHC, FISH negative ratio 1.95, Ki-67 5%, T1BN0 stage Ia  Bone density 08/29/2018:T score -2: Osteopenia  Treatment plan: Adjuvant antiestrogen therapy with anastrozole 1 mg daily.  For osteopenia, recommended continuation of calcium and vitamin D.  If it gets any worse then we will have to use bisphosphonate therapy.  Anastrozole counseling:We discussed the risks and benefits of anti-estrogen therapy with aromatase inhibitors. These include but not limited to insomnia, hot flashes, mood changes, vaginal dryness, bone density loss, and weight gain. We strongly believe that the benefits far outweigh the risks. Patient understands these risks and consented to starting treatment. Planned treatment duration is 5 years.  Patient consented to participating in a research project titled: Improving antiestrogen therapy adherence with the utilization of mobile patient reported outcomes being conducted by our nurse practitioner Ria Comment.  Return to clinic in 3 months for survivorship care plan  visit No orders of the defined types were placed in this encounter.  The patient has a good understanding of the overall plan. she agrees with it. she will call with any problems that may develop before the next visit here.  Nicholas Lose, MD 10/10/2018  Julious Oka Dorshimer am acting as scribe for Dr. Nicholas Lose.  I have reviewed the above documentation for accuracy and completeness, and I agree with the above.

## 2018-10-06 ENCOUNTER — Other Ambulatory Visit: Payer: Self-pay

## 2018-10-06 ENCOUNTER — Ambulatory Visit
Admission: RE | Admit: 2018-10-06 | Discharge: 2018-10-06 | Disposition: A | Discharge status: Home / Self Care | Payer: PPO | Source: Ambulatory Visit | Attending: Radiation Oncology | Admitting: Radiation Oncology

## 2018-10-06 DIAGNOSIS — Z510 Encounter for antineoplastic radiation therapy: Secondary | ICD-10-CM | POA: Diagnosis not present

## 2018-10-09 ENCOUNTER — Other Ambulatory Visit: Payer: Self-pay

## 2018-10-09 ENCOUNTER — Ambulatory Visit
Admission: RE | Admit: 2018-10-09 | Discharge: 2018-10-09 | Disposition: A | Discharge status: Home / Self Care | Payer: PPO | Source: Ambulatory Visit | Attending: Radiation Oncology | Admitting: Radiation Oncology

## 2018-10-09 DIAGNOSIS — Z510 Encounter for antineoplastic radiation therapy: Secondary | ICD-10-CM | POA: Diagnosis not present

## 2018-10-10 ENCOUNTER — Inpatient Hospital Stay: Payer: PPO | Attending: Hematology and Oncology | Admitting: Hematology and Oncology

## 2018-10-10 ENCOUNTER — Ambulatory Visit
Admission: RE | Admit: 2018-10-10 | Discharge: 2018-10-10 | Disposition: A | Discharge status: Home / Self Care | Payer: PPO | Source: Ambulatory Visit | Attending: Radiation Oncology | Admitting: Radiation Oncology

## 2018-10-10 ENCOUNTER — Other Ambulatory Visit: Payer: Self-pay

## 2018-10-10 ENCOUNTER — Encounter (INDEPENDENT_AMBULATORY_CARE_PROVIDER_SITE_OTHER): Payer: Self-pay

## 2018-10-10 DIAGNOSIS — Z7982 Long term (current) use of aspirin: Secondary | ICD-10-CM | POA: Diagnosis not present

## 2018-10-10 DIAGNOSIS — Z79899 Other long term (current) drug therapy: Secondary | ICD-10-CM | POA: Insufficient documentation

## 2018-10-10 DIAGNOSIS — Z923 Personal history of irradiation: Secondary | ICD-10-CM | POA: Insufficient documentation

## 2018-10-10 DIAGNOSIS — C50211 Malignant neoplasm of upper-inner quadrant of right female breast: Secondary | ICD-10-CM

## 2018-10-10 DIAGNOSIS — Z170 Estrogen receptor positive status [ER+]: Secondary | ICD-10-CM | POA: Diagnosis not present

## 2018-10-10 DIAGNOSIS — Z79811 Long term (current) use of aromatase inhibitors: Secondary | ICD-10-CM

## 2018-10-10 DIAGNOSIS — Z791 Long term (current) use of non-steroidal anti-inflammatories (NSAID): Secondary | ICD-10-CM | POA: Diagnosis not present

## 2018-10-10 DIAGNOSIS — M8580 Other specified disorders of bone density and structure, unspecified site: Secondary | ICD-10-CM

## 2018-10-10 DIAGNOSIS — Z510 Encounter for antineoplastic radiation therapy: Secondary | ICD-10-CM | POA: Diagnosis not present

## 2018-10-10 MED ORDER — ANASTROZOLE 1 MG PO TABS: 1.0000 mg | ORAL_TABLET | Freq: Every day | ORAL | 3 refills | Status: DC

## 2018-10-10 NOTE — Assessment & Plan Note (Signed)
07/17/2018:Screening detected right breast mass at 1 o'clock position 1.1 cm, no axillary lymph nodes, biopsy revealed IDC grade 1-2, ER 100%, PR 80%, Ki-67 5%, HER-2 negative, 2+ by IHC and ratio 1.22 by FISH, T1CN0 stage Ia  08/03/2018:Right lumpectomy: IDC 0.6 cm, grade 2, margins negative, 0/4 lymph nodes negative, ER 100%, PR 80%, HER-2 equivocal by IHC, FISH negative ratio 1.95, Ki-67 5%, T1BN0 stage Ia  Bone density 08/29/2018:T score -2: Osteopenia  Treatment plan: Adjuvant antiestrogen therapy with anastrozole 1 mg daily.  For osteopenia, recommended continuation of calcium and vitamin D.  If it gets any worse then we will have to use bisphosphonate therapy.  Anastrozole counseling:We discussed the risks and benefits of anti-estrogen therapy with aromatase inhibitors. These include but not limited to insomnia, hot flashes, mood changes, vaginal dryness, bone density loss, and weight gain. We strongly believe that the benefits far outweigh the risks. Patient understands these risks and consented to starting treatment. Planned treatment duration is 5 years.

## 2018-10-11 ENCOUNTER — Ambulatory Visit
Admission: RE | Admit: 2018-10-11 | Discharge: 2018-10-11 | Disposition: A | Discharge status: Home / Self Care | Payer: PPO | Source: Ambulatory Visit | Attending: Radiation Oncology | Admitting: Radiation Oncology

## 2018-10-11 ENCOUNTER — Encounter: Payer: Self-pay | Admitting: Radiation Oncology

## 2018-10-11 ENCOUNTER — Other Ambulatory Visit: Payer: Self-pay

## 2018-10-11 DIAGNOSIS — C50211 Malignant neoplasm of upper-inner quadrant of right female breast: Secondary | ICD-10-CM | POA: Diagnosis not present

## 2018-10-11 DIAGNOSIS — Z510 Encounter for antineoplastic radiation therapy: Secondary | ICD-10-CM | POA: Diagnosis not present

## 2018-10-13 DIAGNOSIS — E7849 Other hyperlipidemia: Secondary | ICD-10-CM | POA: Diagnosis not present

## 2018-10-13 DIAGNOSIS — Z6822 Body mass index (BMI) 22.0-22.9, adult: Secondary | ICD-10-CM | POA: Diagnosis not present

## 2018-10-13 DIAGNOSIS — E559 Vitamin D deficiency, unspecified: Secondary | ICD-10-CM | POA: Diagnosis not present

## 2018-10-13 DIAGNOSIS — Z0000 Encounter for general adult medical examination without abnormal findings: Secondary | ICD-10-CM | POA: Diagnosis not present

## 2018-10-13 DIAGNOSIS — G4709 Other insomnia: Secondary | ICD-10-CM | POA: Diagnosis not present

## 2018-10-13 DIAGNOSIS — Z1331 Encounter for screening for depression: Secondary | ICD-10-CM | POA: Diagnosis not present

## 2018-10-13 DIAGNOSIS — M25512 Pain in left shoulder: Secondary | ICD-10-CM | POA: Diagnosis not present

## 2018-10-13 DIAGNOSIS — M25511 Pain in right shoulder: Secondary | ICD-10-CM | POA: Diagnosis not present

## 2018-10-13 DIAGNOSIS — Z1339 Encounter for screening examination for other mental health and behavioral disorders: Secondary | ICD-10-CM | POA: Diagnosis not present

## 2018-10-13 DIAGNOSIS — J45998 Other asthma: Secondary | ICD-10-CM | POA: Diagnosis not present

## 2018-10-13 DIAGNOSIS — M859 Disorder of bone density and structure, unspecified: Secondary | ICD-10-CM | POA: Diagnosis not present

## 2018-10-17 NOTE — Progress Notes (Signed)
  Patient Name: Kelly Blackwell MRN: [id number] DOB: 1941-01-27 Referring Physician: Nicholas Lose (Profile Not Attached) Date of Service: 10/13/2018 [facility] [place], [place]                                                        End Of Treatment Note  Diagnoses: C50.211-Malignant neoplasm of upper-inner quadrant of right female breast  Cancer Staging: Carcinoma of upper-inner quadrant of right breast in female, estrogen receptor positive ([place]) Staging form: Breast, AJCC 8th Edition - Clinical: Stage IA (cT1c, cN0, cM0, G1, ER+, PR+, HER2-) - Signed by Eppie Gibson, MD on 07/31/2018  Intent: Curative  Radiation Treatment Dates: 09/14/2018 through 10/11/2018 Site Technique Total Dose Dose per Fx Completed Fx Beam Energies  Breast: Breast_Rt 3D 40.05/40.05 2.67 15/15 6X  Breast: Breast_Rt_Bst specialPort 10/10 2 5/5 12E   Narrative: The patient tolerated radiation therapy relatively well. She experienced mild fatigue and some expected skin irritation as she progressed through treatment. She developed erythema over her right breast and continues to apply Radiaplex as directed.   Plan: The patient will follow-up with radiation oncology in one month.  ________________________________________________  Eppie Gibson, MD  This document serves as a record of services personally performed by Eppie Gibson, MD. It was created on her behalf by Rae Lips, a trained medical scribe. The creation of this record is based on the scribe's personal observations and the provider's statements to them. This document has been checked and approved by the attending provider.

## 2018-11-07 ENCOUNTER — Encounter (INDEPENDENT_AMBULATORY_CARE_PROVIDER_SITE_OTHER): Payer: Self-pay

## 2018-11-13 NOTE — Progress Notes (Signed)
I called the patient today about her upcoming follow-up appointment in radiation oncology.   Given concerns about the COVID-19 pandemic, I offered a phone assessment with the patient to determine if coming to the clinic was necessary. She accepted.  I let the patient know that I had spoken with Dr. Isidore Moos, and she wanted them to know the importance of washing their hands for at least 20 seconds at a time, especially after going out in public, and before they eat.  Limit going out in public whenever possible. Do not touch your face, unless your hands are clean, such as when bathing. Get plenty of rest, eat well, and stay hydrated.   The patient denies any symptomatic concerns.  Specifically, they report good healing of their skin in the radiation fields.  Skin is intact.    I recommended that she continue skin care by applying oil or lotion with vitamin E to the skin in the radiation fields, BID, for 2 more months.  Continue follow-up with medical oncology - follow-up is scheduled on 02/08/19 in the survivorship with Wilber Bihari NP . She tells me that she has an appointment to see her breast surgeon on 12/01/18. I explained that yearly mammograms are important for patients with intact breast tissue, and physical exams are important after mastectomy for patients that cannot undergo mammography.  I encouraged her to call if she had further questions or concerns about her healing. Otherwise, she will follow-up PRN in radiation oncology. Patient is pleased with this plan, and we will cancel her upcoming follow-up to reduce the risk of COVID-19 transmission.

## 2018-11-14 ENCOUNTER — Ambulatory Visit
Admission: RE | Admit: 2018-11-14 | Discharge: 2018-11-14 | Disposition: A | Discharge status: Home / Self Care | Payer: PPO | Source: Ambulatory Visit | Attending: Radiation Oncology | Admitting: Radiation Oncology

## 2018-12-01 DIAGNOSIS — C50911 Malignant neoplasm of unspecified site of right female breast: Secondary | ICD-10-CM | POA: Diagnosis not present

## 2018-12-18 ENCOUNTER — Telehealth: Payer: Self-pay | Admitting: Adult Health

## 2018-12-18 ENCOUNTER — Encounter (INDEPENDENT_AMBULATORY_CARE_PROVIDER_SITE_OTHER): Payer: Self-pay

## 2018-12-18 NOTE — Telephone Encounter (Signed)
Called and [facility] for patient to return my call about My Chart Care Companion Study.    Wilber Bihari, NP

## 2019-01-15 ENCOUNTER — Telehealth: Payer: Self-pay | Admitting: Adult Health

## 2019-01-15 NOTE — Telephone Encounter (Signed)
Called patient about anti estrogen therapy surveys.  Patient has not received any notifications.  I reviewed patients to do list, and added reminds to patient questionnaires.    Wilber Bihari, NP

## 2019-01-17 ENCOUNTER — Telehealth: Payer: Self-pay | Admitting: Adult Health

## 2019-01-17 NOTE — Telephone Encounter (Signed)
I talk with patient regarding video visit °

## 2019-01-29 ENCOUNTER — Telehealth: Payer: Self-pay | Admitting: Adult Health

## 2019-01-29 NOTE — Telephone Encounter (Signed)
Patient AET questionnaires not listed in patient to do list.  IT contacted for assistance.    Wilber Bihari, NP

## 2019-02-07 ENCOUNTER — Telehealth: Payer: Self-pay | Admitting: Adult Health

## 2019-02-07 NOTE — Telephone Encounter (Signed)
Left message for patient with mychart visit for pre reg

## 2019-02-08 ENCOUNTER — Encounter: Payer: Self-pay | Admitting: Adult Health

## 2019-02-08 ENCOUNTER — Inpatient Hospital Stay: Payer: PPO | Attending: Adult Health | Admitting: Adult Health

## 2019-02-08 DIAGNOSIS — Z79811 Long term (current) use of aromatase inhibitors: Secondary | ICD-10-CM | POA: Diagnosis not present

## 2019-02-08 DIAGNOSIS — Z923 Personal history of irradiation: Secondary | ICD-10-CM

## 2019-02-08 DIAGNOSIS — Z170 Estrogen receptor positive status [ER+]: Secondary | ICD-10-CM | POA: Diagnosis not present

## 2019-02-08 DIAGNOSIS — C50211 Malignant neoplasm of upper-inner quadrant of right female breast: Secondary | ICD-10-CM | POA: Diagnosis not present

## 2019-02-08 NOTE — Progress Notes (Signed)
SURVIVORSHIP VIRTUAL VISIT:  I connected with Kelly Blackwell on 02/08/19 at 10:00 AM EDT by my chart video and verified that I am speaking with the correct person using two identifiers.  I discussed the limitations, risks, security and privacy concerns of performing an evaluation and management service virtually and the availability of in person appointments. I also discussed with the patient that there may be a patient responsible charge related to this service. The patient expressed understanding and agreed to proceed.   BRIEF ONCOLOGIC HISTORY:  Oncology History  Carcinoma of upper-inner quadrant of right breast in female, estrogen receptor positive ([place])  07/31/2018 Initial Diagnosis   Screening detected right breast mass at 1 o'clock position 1.1 cm, no axillary lymph nodes, biopsy revealed IDC grade 1-2, ER 100%, PR 80%, Ki-67 5%, HER-2 negative, 2+ by IHC and ratio 1.22 by FISH, T1CN0 stage Ia   07/31/2018 Cancer Staging   Staging form: Breast, AJCC 8th Edition - Clinical: Stage IA (cT1c, cN0, cM0, G1, ER+, PR+, HER2-) - Signed by Eppie Gibson, MD on 07/31/2018   08/03/2018 Surgery   Right lumpectomy: IDC 0.6 cm, grade 2, margins negative, 0/4 lymph nodes negative, ER 100%, PR 80%, HER-2 equivocal by IHC, FISH negative ratio 1.95, Ki-67 5%, T1BN0 stage Ia   09/15/2018 - 10/10/2018 Radiation Therapy   Adjuvant radiation with Dr. Isidore Moos   10/10/2018 -  Anti-estrogen oral therapy   Anastrozole, 22m daily, plan for 5 years     INTERVAL HISTORY:  [name] review her survivorship care plan detailing her treatment course for breast cancer, as well as monitoring long-term side effects of that treatment, education regarding health maintenance, screening, and overall wellness and health promotion.     Overall, Kelly Blackwell reports feeling quite well.  She is taking Anastrozole daily and is tolerating it well.  She has no issues with arthralgias, vaginal dryness, or hot flashes.  She does have  some mild arthritis in her neck, but that was present prior to starting the Anastrozole and isn't worsening.    CLorieannnotes that she has completed her colon cancer screening with colonoscopies, but her PCP still does hemoccults on her annually since her dad had CRC when he was in his 825s  She notes that she sees her PCP regularly.  She last saw Dr. TGeorgette Doverin 01/2019 and notes that her visit with him went well.  She notes that she has some scar tissue at her lumpectomy site.    CEvelinanotes that she has osteopenia noted on bone density in 08/2018.  She is well informed on bone health and is proactive with calcium, vitamin d, and weight bearing exercises.    REVIEW OF SYSTEMS:  Review of Systems  Constitutional: Negative for appetite change, chills, fatigue and unexpected weight change.  HENT:   Negative for hearing loss, lump/mass, mouth sores and trouble swallowing.   Eyes: Negative for eye problems and icterus.  Respiratory: Negative for chest tightness, cough and shortness of breath.   Cardiovascular: Negative for chest pain, leg swelling and palpitations.  Gastrointestinal: Negative for abdominal distention, abdominal pain, constipation, diarrhea, nausea and vomiting.  Endocrine: Negative for hot flashes.  Genitourinary: Negative for difficulty urinating.   Musculoskeletal: Negative for arthralgias.  Skin: Negative for itching and rash.  Neurological: Negative for dizziness, extremity weakness, headaches and numbness.  Hematological: Negative for adenopathy. Does not bruise/bleed easily.  Psychiatric/Behavioral: Negative for depression. The patient is not nervous/anxious.   Breast: Denies any new nodularity, masses, tenderness,  nipple changes, or nipple discharge.      ONCOLOGY TREATMENT TEAM:  1. Surgeon:  Dr. Georgette [facility] at [facility] 2. Medical Oncologist: Dr. Lindi Adie  3. Radiation Oncologist: Dr. Isidore Moos    PAST MEDICAL/SURGICAL HISTORY:  Past Medical History:   Diagnosis Date  . Allergy    seasonal allergies.   . Anxiety   . Arthritis    Neck  . Hypercholesteremia   . Hypertension   . Osteopenia    Past Surgical History:  Procedure Laterality Date  . APPENDECTOMY     age 15  . BREAST LUMPECTOMY WITH RADIOACTIVE SEED AND SENTINEL LYMPH NODE BIOPSY Right 08/03/2018   Procedure: RIGHT BREAST LUMPECTOMY WITH RADIOACTIVE SEED AND SENTINEL LYMPH NODE BIOPSY WITH BLUE DYE INJECTION;  Surgeon: Donnie Mesa, MD;  Location: [facility];  Service: General;  Laterality: Right;  . bunionectmy     1986  . CHOLECYSTECTOMY     age 63  . TONSILLECTOMY       ALLERGIES:  Allergies  Allergen Reactions  . Sulfa Antibiotics     Fever and rash      CURRENT MEDICATIONS:  Outpatient Encounter Medications as of 02/08/2019  Medication Sig  . ALPRAZolam (XANAX) 0.25 MG tablet Take 0.25 mg by mouth 2 (two) times daily as needed for anxiety.  Marland Kitchen anastrozole (ARIMIDEX) 1 MG tablet Take 1 tablet (1 mg total) by mouth daily.  Marland Kitchen aspirin EC 81 MG tablet Take 81 mg by mouth daily.  Marland Kitchen atorvastatin (LIPITOR) 10 MG tablet Take 10 mg by mouth daily.   . Calcium Carb-Cholecalciferol (CALCIUM 600 + D PO) Take 1 tablet by mouth 2 (two) times daily.  . cetirizine (ZYRTEC) 10 MG tablet Take 10 mg by mouth daily.  . cholecalciferol (VITAMIN D3) 25 MCG (1000 UT) tablet Take 1,000 Units by mouth daily.  Marland Kitchen co-enzyme Q-10 30 MG capsule Take 100 mg by mouth daily.  . meloxicam (MOBIC) 15 MG tablet   . Multiple Vitamins-Minerals (MULTIVITAMIN WITH MINERALS) tablet Take 1 tablet by mouth 3 (three) times a week.  Marland Kitchen omeprazole (PRILOSEC) 20 MG capsule Take 20 mg by mouth daily.  Marland Kitchen telmisartan-hydrochlorothiazide (MICARDIS HCT) 80-12.5 MG tablet Take 1 tablet by mouth daily.  . vitamin C (ASCORBIC ACID) 500 MG tablet Take 500 mg by mouth daily.   No facility-administered encounter medications on file as of 02/08/2019.      ONCOLOGIC FAMILY HISTORY:  Non contributory   GENETIC  COUNSELING/TESTING: Not at this time  SOCIAL HISTORY:  Social History   Socioeconomic History  . Marital status: Married    Spouse name: Not on file  . Number of children: 3  . Years of education: Not on file  . Highest education level: Not on file  Occupational History  . Not on file  Social Needs  . Financial resource strain: Not on file  . Food insecurity    Worry: Not on file    Inability: Not on file  . Transportation needs    Medical: No    Non-medical: No  Tobacco Use  . Smoking status: Never Smoker  . Smokeless tobacco: Never Used  Substance and Sexual Activity  . Alcohol use: Yes    Comment: occasionally, a couple of times a month.   . Drug use: Never  . Sexual activity: Not on file  Lifestyle  . Physical activity    Days per week: Not on file    Minutes per session: Not on file  . Stress: Not  on file  Relationships  . Social Herbalist on phone: Not on file    Gets together: Not on file    Attends religious service: Not on file    Active member of club or organization: Not on file    Attends meetings of clubs or organizations: Not on file    Relationship status: Not on file  . Intimate partner violence    Fear of current or ex partner: No    Emotionally abused: No    Physically abused: No    Forced sexual activity: No  Other Topics Concern  . Not on file  Social History Narrative  . Not on file     OBSERVATIONS/OBJECTIVE:  Patient appears well.  She is in no apparent distress.  Her breathing is non labored.  Skin visualized is without rash or lesion.  Neuro is non focal.  Mood and behavior are normal.    LABORATORY DATA:  None for this visit.  DIAGNOSTIC IMAGING:  None for this visit.   ASSESSMENT AND PLAN:  Ms.. Blackwell is a pleasant 78 y.o. female with Stage IA right breast invasive ductal carcinoma, ER+/PR+/HER2-, diagnosed in 07/2018, treated with lumpectomy, adjuvant radiation therapy, and anti-estrogen therapy with Anastrozole  beginning in 09/2018.  She presents to the Survivorship Clinic for our initial meeting and routine follow-up post-completion of treatment for breast cancer.    1. Stage IA right breast cancer:  Kelly Blackwell is continuing to recover from definitive treatment for breast cancer. She will follow-up with her medical oncologist, Dr. Lindi Adie in 07/2019 with history and physical exam per surveillance protocol.  She will continue her anti-estrogen therapy with Anastrozole. Thus far, she is tolerating the Anastrozole well, with minimal side effects. She was instructed to make Dr. Lindi Adie or myself aware if she begins to experience any worsening side effects of the medication and I could see her back in clinic to help manage those side effects, as needed. Her mammogram is due 06/2019; orders placed today.  Today, a comprehensive survivorship care plan and treatment summary was reviewed with the patient today detailing her breast cancer diagnosis, treatment course, potential late/long-term effects of treatment, appropriate follow-up care with recommendations for the future, and patient education resources.  A copy of this summary, along with a letter will be sent to the patient's primary care provider via mail/fax/In Basket message after today's visit.    2. Bone health:  Given Kelly Blackwell's age/history of breast cancer and her current treatment regimen including anti-estrogen therapy with Anastrozole, she is at risk for bone demineralization.  Her last DEXA scan was 08/2018, which showed osteopenia with a T score of -2.0 in the Spine.  In the meantime, she was encouraged to increase her consumption of foods rich in calcium, as well as increase her weight-bearing activities.  She was given education on specific activities to promote bone health.  3. Cancer screening:  Due to Kelly Blackwell's history and her age, she should receive screening for skin cancers, colon cancer, and gynecologic cancers.  The information and recommendations  are listed on the patient's comprehensive care plan/treatment summary and were reviewed in detail with the patient.   4. Health maintenance and wellness promotion: Kelly Blackwell was encouraged to consume 5-7 servings of fruits and vegetables per day. We reviewed the "Nutrition Rainbow" handout, as well as the handout "Take Control of Your Health and Reduce Your Cancer Risk" from the [employer].  She was also encouraged to engage  in moderate to vigorous exercise for 30 minutes per day most days of the week. We discussed the [employer] fitness program, which is designed for cancer survivors to help them become more physically fit after cancer treatments.  She was instructed to limit her alcohol consumption and continue to abstain from tobacco use.   5. Support services/counseling: It is not uncommon for this period of the patient's cancer care trajectory to be one of many emotions and stressors.  We discussed how this can be increasingly difficult during the times of quarantine and social distancing due to the COVID-19 pandemic.   She was given information regarding our available services and encouraged to contact me with any questions or for help enrolling in any of our support group/programs.    Follow up instructions:    -Return to cancer center in 07/2018 for f/u with Dr. Lindi Adie  -Mammogram due in 06/2019 -Follow up with surgery 01/2020 -She is welcome to return back to the Survivorship Clinic at any time; no additional follow-up needed at this time.  -Consider referral back to survivorship as a long-term survivor for continued surveillance  The patient was provided an opportunity to ask questions and all were answered. The patient agreed with the plan and demonstrated an understanding of the instructions.   The patient was advised to call back or seek an in-person evaluation if the symptoms worsen or if the condition fails to improve as anticipated.   I provided 25 minutes of  face-to-face video visit time during this encounter, and > 50% was spent counseling as documented under my assessment & plan.  Scot Dock, NP

## 2019-02-08 NOTE — Patient Instructions (Signed)

## 2019-02-12 ENCOUNTER — Telehealth: Payer: Self-pay | Admitting: Hematology and Oncology

## 2019-02-12 NOTE — Telephone Encounter (Signed)
I could not reach patient regarding schedule  °

## 2019-06-26 ENCOUNTER — Telehealth: Payer: Self-pay | Admitting: *Deleted

## 2019-06-26 ENCOUNTER — Encounter: Payer: Self-pay | Admitting: *Deleted

## 2019-06-26 NOTE — Telephone Encounter (Signed)
On 06-26-19 fax medical records to [facility], it was consult note, follow up note, sim & planning note, end of tx note.

## 2019-07-05 ENCOUNTER — Ambulatory Visit
Admission: RE | Admit: 2019-07-05 | Discharge: 2019-07-05 | Disposition: A | Discharge status: Home / Self Care | Payer: PPO | Source: Ambulatory Visit | Attending: Adult Health | Admitting: Adult Health

## 2019-07-05 ENCOUNTER — Other Ambulatory Visit: Payer: Self-pay

## 2019-07-05 DIAGNOSIS — R922 Inconclusive mammogram: Secondary | ICD-10-CM | POA: Diagnosis not present

## 2019-07-05 DIAGNOSIS — C50211 Malignant neoplasm of upper-inner quadrant of right female breast: Secondary | ICD-10-CM

## 2019-07-05 DIAGNOSIS — Z170 Estrogen receptor positive status [ER+]: Secondary | ICD-10-CM

## 2019-07-05 HISTORY — DX: Personal history of irradiation: Z92.3

## 2019-07-24 DIAGNOSIS — C50911 Malignant neoplasm of unspecified site of right female breast: Secondary | ICD-10-CM | POA: Diagnosis not present

## 2019-08-01 DIAGNOSIS — D225 Melanocytic nevi of trunk: Secondary | ICD-10-CM | POA: Diagnosis not present

## 2019-08-01 DIAGNOSIS — D1801 Hemangioma of skin and subcutaneous tissue: Secondary | ICD-10-CM | POA: Diagnosis not present

## 2019-08-01 DIAGNOSIS — Z85828 Personal history of other malignant neoplasm of skin: Secondary | ICD-10-CM | POA: Diagnosis not present

## 2019-08-01 DIAGNOSIS — D2271 Melanocytic nevi of right lower limb, including hip: Secondary | ICD-10-CM | POA: Diagnosis not present

## 2019-08-01 DIAGNOSIS — D692 Other nonthrombocytopenic purpura: Secondary | ICD-10-CM | POA: Diagnosis not present

## 2019-08-01 DIAGNOSIS — D2272 Melanocytic nevi of left lower limb, including hip: Secondary | ICD-10-CM | POA: Diagnosis not present

## 2019-08-01 DIAGNOSIS — L814 Other melanin hyperpigmentation: Secondary | ICD-10-CM | POA: Diagnosis not present

## 2019-08-01 DIAGNOSIS — L821 Other seborrheic keratosis: Secondary | ICD-10-CM | POA: Diagnosis not present

## 2019-08-12 NOTE — Progress Notes (Signed)
Patient Care Team: Leanna Battles, MD as PCP - General (Internal Medicine) Nicholas Lose, MD as Consulting Physician (Hematology and Oncology) Eppie Gibson, MD as Attending Physician (Radiation Oncology) Donnie Mesa, MD as Consulting Physician (General Surgery)  DIAGNOSIS:    ICD-10-CM   1. Carcinoma of upper-inner quadrant of right breast in female, estrogen receptor positive ([place])  C50.211    Z17.0     SUMMARY OF ONCOLOGIC HISTORY: Oncology History  Carcinoma of upper-inner quadrant of right breast in female, estrogen receptor positive ([place])  07/31/2018 Initial Diagnosis   Screening detected right breast mass at 1 o'clock position 1.1 cm, no axillary lymph nodes, biopsy revealed IDC grade 1-2, ER 100%, PR 80%, Ki-67 5%, HER-2 negative, 2+ by IHC and ratio 1.22 by FISH, T1CN0 stage Ia   07/31/2018 Cancer Staging   Staging form: Breast, AJCC 8th Edition - Clinical: Stage IA (cT1c, cN0, cM0, G1, ER+, PR+, HER2-) - Signed by Eppie Gibson, MD on 07/31/2018   08/03/2018 Surgery   Right lumpectomy: IDC 0.6 cm, grade 2, margins negative, 0/4 lymph nodes negative, ER 100%, PR 80%, HER-2 equivocal by IHC, FISH negative ratio 1.95, Ki-67 5%, T1BN0 stage Ia   09/15/2018 - 10/10/2018 Radiation Therapy   Adjuvant radiation with Dr. Isidore Moos   10/10/2018 -  Anti-estrogen oral therapy   Anastrozole, 81m daily, plan for 5 years     CHIEF COMPLIANT: Follow-up of right breast cancer on anastrozole  INTERVAL HISTORY: Kelly HIPPis a 79y.o. with above-mentioned history of right breast cancer treated with lumpectomy, adjuvant radiation, and who is currently on antiestrogen therapy with anastrozole. Mammogram on 07/05/19 showed no evidence of malignancy bilaterally. She presents to the clinic today for follow-up.  She is tolerating anastrozole extremely well.  She lives in a retirement community and is hoping to get the Covid vaccine on first week of February.  She has developed a strange  allergy to beef.  ALLERGIES:  is allergic to sulfa antibiotics.  MEDICATIONS:  Current Outpatient Medications  Medication Sig Dispense Refill  . ALPRAZolam (XANAX) 0.25 MG tablet Take 0.25 mg by mouth 2 (two) times daily as needed for anxiety.    .Marland Kitchenanastrozole (ARIMIDEX) 1 MG tablet Take 1 tablet (1 mg total) by mouth daily. 90 tablet 3  . aspirin EC 81 MG tablet Take 81 mg by mouth daily.    .Marland Kitchenatorvastatin (LIPITOR) 10 MG tablet Take 10 mg by mouth daily.     . Calcium Carb-Cholecalciferol (CALCIUM 600 + D PO) Take 1 tablet by mouth 2 (two) times daily.    . cetirizine (ZYRTEC) 10 MG tablet Take 10 mg by mouth daily.    . cholecalciferol (VITAMIN D3) 25 MCG (1000 UT) tablet Take 1,000 Units by mouth daily.    .Marland Kitchenco-enzyme Q-10 30 MG capsule Take 100 mg by mouth daily.    . meloxicam (MOBIC) 15 MG tablet     . Multiple Vitamins-Minerals (MULTIVITAMIN WITH MINERALS) tablet Take 1 tablet by mouth 3 (three) times a week.    .Marland Kitchenomeprazole (PRILOSEC) 20 MG capsule Take 20 mg by mouth daily.    .Marland Kitchentelmisartan-hydrochlorothiazide (MICARDIS HCT) 80-12.5 MG tablet Take 1 tablet by mouth daily.    . vitamin C (ASCORBIC ACID) 500 MG tablet Take 500 mg by mouth daily.     No current facility-administered medications for this visit.    PHYSICAL EXAMINATION: ECOG PERFORMANCE STATUS: 1 - Symptomatic but completely ambulatory  Vitals:   08/13/19 [id number]  BP: 133/65  Pulse: 77  Resp: 16  Temp: 98.4 F (36.9 C)  SpO2: 99%   Filed Weights   08/13/19 0916  Weight: 120 lb 11.2 oz (54.7 kg)      LABORATORY DATA:  I have reviewed the data as listed CMP Latest Ref Rng & Units 07/31/2018  Glucose 70 - 99 mg/dL 98  BUN 8 - 23 mg/dL 15  Creatinine 0.44 - 1.00 mg/dL 0.83  Sodium 135 - 145 mmol/L 134(L)  Potassium 3.5 - 5.1 mmol/L 3.6  Chloride 98 - 111 mmol/L 100  CO2 22 - 32 mmol/L 27  Calcium 8.9 - 10.3 mg/dL 9.6    Lab Results  Component Value Date   WBC 6.5 07/31/2018   HGB 13.6 07/31/2018    HCT 41.4 07/31/2018   MCV 89.8 07/31/2018   PLT 268 07/31/2018    ASSESSMENT & PLAN:  Carcinoma of upper-inner quadrant of right breast in female, estrogen receptor positive ([place]) 07/17/2018:Screening detected right breast mass at 1 o'clock position 1.1 cm, no axillary lymph nodes, biopsy revealed IDC grade 1-2, ER 100%, PR 80%, Ki-67 5%, HER-2 negative, 2+ by IHC and ratio 1.22 by FISH, T1CN0 stage Ia  08/03/2018:Right lumpectomy: IDC 0.6 cm, grade 2, margins negative, 0/4 lymph nodes negative, ER 100%, PR 80%, HER-2 equivocal by IHC, FISH negative ratio 1.95, Ki-67 5%, T1BN0 stage Ia  Bone density 08/29/2018:T score -2: Osteopenia  Treatment plan: Adjuvant antiestrogen therapy with anastrozole 1 mg daily. Anastrozole toxicities: Denies any side effects of anastrozole therapy.  Breast cancer surveillance: 1.  Breast exam 08/13/2019: Benign 2.  Mammogram 07/05/2019: No evidence of malignancy breast density category C  Patient developed an allergy to beef. Osteopenia: On calcium and vitamin D bone densities every 2 years. Return to clinic in 1 year for follow-up    No orders of the defined types were placed in this encounter.  The patient has a good understanding of the overall plan. she agrees with it. she will call with any problems that may develop before the next visit here.  Total time spent: 15 mins including face to face time and time spent for planning, charting and coordination of care  Nicholas Lose, MD 08/13/2019  I, Cloyde Reams Dorshimer, am acting as scribe for Dr. Nicholas Lose.  I have reviewed the above documentation for accuracy and completeness, and I agree with the above.

## 2019-08-13 ENCOUNTER — Other Ambulatory Visit: Payer: Self-pay

## 2019-08-13 ENCOUNTER — Inpatient Hospital Stay: Payer: PPO | Attending: Hematology and Oncology | Admitting: Hematology and Oncology

## 2019-08-13 DIAGNOSIS — Z170 Estrogen receptor positive status [ER+]: Secondary | ICD-10-CM

## 2019-08-13 DIAGNOSIS — C50211 Malignant neoplasm of upper-inner quadrant of right female breast: Secondary | ICD-10-CM | POA: Insufficient documentation

## 2019-08-13 DIAGNOSIS — M8580 Other specified disorders of bone density and structure, unspecified site: Secondary | ICD-10-CM | POA: Insufficient documentation

## 2019-08-13 MED ORDER — ANASTROZOLE 1 MG PO TABS: 1.0000 mg | ORAL_TABLET | Freq: Every day | ORAL | 3 refills | Status: DC

## 2019-08-13 NOTE — Assessment & Plan Note (Signed)
07/17/2018:Screening detected right breast mass at 1 o'clock position 1.1 cm, no axillary lymph nodes, biopsy revealed IDC grade 1-2, ER 100%, PR 80%, Ki-67 5%, HER-2 negative, 2+ by IHC and ratio 1.22 by FISH, T1CN0 stage Ia  08/03/2018:Right lumpectomy: IDC 0.6 cm, grade 2, margins negative, 0/4 lymph nodes negative, ER 100%, PR 80%, HER-2 equivocal by IHC, FISH negative ratio 1.95, Ki-67 5%, T1BN0 stage Ia  Bone density 08/29/2018:T score -2: Osteopenia  Treatment plan: Adjuvant antiestrogen therapy with anastrozole 1 mg daily. Anastrozole toxicities:  Breast cancer surveillance: 1.  Breast exam 08/13/2019: Benign 2.  Mammogram 07/05/2019: No evidence of malignancy breast density category C  Osteopenia: On calcium and vitamin D bone densities every 2 years. Return to clinic in 1 year for follow-up

## 2019-08-14 ENCOUNTER — Telehealth: Payer: Self-pay | Admitting: Hematology and Oncology

## 2019-08-14 NOTE — Telephone Encounter (Signed)
Scheduled per 1/25 los. Called and spoke with pt, confirmed 1/25 appt  

## 2019-08-28 DIAGNOSIS — Z01419 Encounter for gynecological examination (general) (routine) without abnormal findings: Secondary | ICD-10-CM | POA: Diagnosis not present

## 2019-08-29 DIAGNOSIS — R112 Nausea with vomiting, unspecified: Secondary | ICD-10-CM | POA: Diagnosis not present

## 2019-08-29 DIAGNOSIS — Z91018 Allergy to other foods: Secondary | ICD-10-CM | POA: Diagnosis not present

## 2019-09-02 ENCOUNTER — Ambulatory Visit: Payer: PPO

## 2019-10-11 DIAGNOSIS — J3089 Other allergic rhinitis: Secondary | ICD-10-CM | POA: Diagnosis not present

## 2019-10-11 DIAGNOSIS — M859 Disorder of bone density and structure, unspecified: Secondary | ICD-10-CM | POA: Diagnosis not present

## 2019-10-11 DIAGNOSIS — J301 Allergic rhinitis due to pollen: Secondary | ICD-10-CM | POA: Diagnosis not present

## 2019-10-11 DIAGNOSIS — Z91018 Allergy to other foods: Secondary | ICD-10-CM | POA: Diagnosis not present

## 2019-10-11 DIAGNOSIS — E7849 Other hyperlipidemia: Secondary | ICD-10-CM | POA: Diagnosis not present

## 2019-10-11 DIAGNOSIS — J3081 Allergic rhinitis due to animal (cat) (dog) hair and dander: Secondary | ICD-10-CM | POA: Diagnosis not present

## 2019-10-12 DIAGNOSIS — Z91018 Allergy to other foods: Secondary | ICD-10-CM | POA: Diagnosis not present

## 2019-10-18 DIAGNOSIS — I10 Essential (primary) hypertension: Secondary | ICD-10-CM | POA: Diagnosis not present

## 2019-10-18 DIAGNOSIS — Z91018 Allergy to other foods: Secondary | ICD-10-CM | POA: Diagnosis not present

## 2019-10-18 DIAGNOSIS — E559 Vitamin D deficiency, unspecified: Secondary | ICD-10-CM | POA: Diagnosis not present

## 2019-10-18 DIAGNOSIS — Z1212 Encounter for screening for malignant neoplasm of rectum: Secondary | ICD-10-CM | POA: Diagnosis not present

## 2019-10-18 DIAGNOSIS — Z0000 Encounter for general adult medical examination without abnormal findings: Secondary | ICD-10-CM | POA: Diagnosis not present

## 2019-10-18 DIAGNOSIS — G4700 Insomnia, unspecified: Secondary | ICD-10-CM | POA: Diagnosis not present

## 2019-10-18 DIAGNOSIS — R82998 Other abnormal findings in urine: Secondary | ICD-10-CM | POA: Diagnosis not present

## 2019-10-18 DIAGNOSIS — R079 Chest pain, unspecified: Secondary | ICD-10-CM | POA: Diagnosis not present

## 2019-10-18 DIAGNOSIS — E785 Hyperlipidemia, unspecified: Secondary | ICD-10-CM | POA: Diagnosis not present

## 2019-11-15 ENCOUNTER — Encounter: Payer: Self-pay | Admitting: Cardiology

## 2019-11-15 ENCOUNTER — Ambulatory Visit: Payer: PPO | Admitting: Cardiology

## 2019-11-15 ENCOUNTER — Other Ambulatory Visit: Payer: Self-pay

## 2019-11-15 VITALS — BP 145/75 | HR 93 | Temp 97.5°F | Resp 18 | Ht 61.5 in | Wt 120.0 lb

## 2019-11-15 DIAGNOSIS — I209 Angina pectoris, unspecified: Secondary | ICD-10-CM | POA: Diagnosis not present

## 2019-11-15 DIAGNOSIS — I10 Essential (primary) hypertension: Secondary | ICD-10-CM

## 2019-11-15 DIAGNOSIS — E782 Mixed hyperlipidemia: Secondary | ICD-10-CM | POA: Diagnosis not present

## 2019-11-15 MED ORDER — NITROGLYCERIN 0.4 MG SL SUBL: 0.4000 mg | SUBLINGUAL_TABLET | SUBLINGUAL | 0 refills | Status: DC | PRN

## 2019-11-15 NOTE — Progress Notes (Signed)
Date:  11/15/2019   ID:  Kelly Blackwell, DOB [birth year], MRN [id number]  PCP:  Leanna Battles, MD  Cardiologist:  Rex Kras, DO, [facility] (established care 11/15/2019) Former Cardiology Providers: Dr. Adrian Prows  REASON FOR CONSULT: Chest Pain   REQUESTING PHYSICIAN:  Leanna Battles, MD [address] [place],  [place] [address]  Chief Complaint  Patient presents with  . New Patient (Initial Visit)    Referred by Dr. Philip Aspen  . Chest Pain    HPI  Kelly Blackwell is a 79 y.o. female who is being seen today for the evaluation of chest pain at the request of Leanna Battles, MD. Patient's past medical history and cardiac risk factors include: Hypertension, hyperlipidemia, history of breast cancer, postmenopausal female, advanced age.  Patient states that she has been having chest discomfort that has been going on for the last several months.  Initially she had similar discomfort which was attributed to heartburn and was started on omeprazole and did well for couple years.  However recently they have resurfaced.  The pain is located substernally, radiates to bilateral shoulders, 7 out of 10, last for less than 5 minutes, occurs at least once a month, improves with rest.  About 1 month ago patient states that she had chest pain with effort related activity.  She parked her car and went to the grocery store by the time she got into the store she was having substernal chest pain which resolved with rest.  In the past patient has had a treadmill stress test which was reported to be equivocal and was referred for nuclear stress test because of her symptoms improved this never took place.  No history of premature coronary artery disease in the family or sudden cardiac death.  Denies prior history of coronary artery disease, myocardial infarction, congestive heart failure, deep venous thrombosis, pulmonary embolism, stroke, transient ischemic attack.  FUNCTIONAL STATUS: She walks 5 days a week  for 30-45 minutes.    ALLERGIES: Allergies  Allergen Reactions  . Beef Allergy Diarrhea and Nausea And Vomiting  . Sulfa Antibiotics     Fever and rash     MEDICATION LIST PRIOR TO VISIT: Current Meds  Medication Sig  . ALPRAZolam (XANAX) 0.25 MG tablet Take 0.25 mg by mouth 2 (two) times daily as needed for anxiety.  Marland Kitchen anastrozole (ARIMIDEX) 1 MG tablet Take 1 tablet (1 mg total) by mouth daily.  Marland Kitchen aspirin EC 81 MG tablet Take 81 mg by mouth daily.  Marland Kitchen atorvastatin (LIPITOR) 10 MG tablet Take 10 mg by mouth daily.   . Calcium Carb-Cholecalciferol (CALCIUM 600 + D PO) Take 1 tablet by mouth 2 (two) times daily.  . cetirizine (ZYRTEC) 10 MG tablet Take 10 mg by mouth daily.  . cholecalciferol (VITAMIN D3) 25 MCG (1000 UT) tablet Take 1,000 Units by mouth daily.  Marland Kitchen co-enzyme Q-10 30 MG capsule Take 100 mg by mouth daily.  Marland Kitchen EPINEPHrine 0.3 mg/0.3 mL IJ SOAJ injection INJECT INTO OUTER THIGH AS NEEDED FOR SYSTEMIC REACTIONS  . omeprazole (PRILOSEC) 20 MG capsule Take 20 mg by mouth daily.  Marland Kitchen telmisartan-hydrochlorothiazide (MICARDIS HCT) 80-12.5 MG tablet Take 1 tablet by mouth daily.     PAST MEDICAL HISTORY: Past Medical History:  Diagnosis Date  . Allergy    seasonal allergies.   . Anxiety   . Arthritis    Neck  . Breast cancer ([place])   . Hypercholesteremia   . Hypertension   . Osteopenia   .  Personal history of radiation therapy     PAST SURGICAL HISTORY: Past Surgical History:  Procedure Laterality Date  . APPENDECTOMY     age 53  . BREAST LUMPECTOMY Right 08/03/2018  . BREAST LUMPECTOMY WITH RADIOACTIVE SEED AND SENTINEL LYMPH NODE BIOPSY Right 08/03/2018   Procedure: RIGHT BREAST LUMPECTOMY WITH RADIOACTIVE SEED AND SENTINEL LYMPH NODE BIOPSY WITH BLUE DYE INJECTION;  Surgeon: Donnie Mesa, MD;  Location: [facility];  Service: General;  Laterality: Right;  . bunionectmy     1986  . CHOLECYSTECTOMY     age 70  . TONSILLECTOMY      FAMILY HISTORY: The patient  family history includes AAA (abdominal aortic aneurysm) in her father; Atrial fibrillation in her mother; Colon cancer in her father; Coronary artery disease in her father; Heart failure in her mother; Hypertension in her mother; Osteoporosis in her mother.  SOCIAL HISTORY:  The patient  reports that she has never smoked. She has never used smokeless tobacco. She reports previous alcohol use. She reports that she does not use drugs.  REVIEW OF SYSTEMS: Review of Systems  Constitution: Negative for chills and fever.  HENT: Negative for hoarse voice and nosebleeds.   Eyes: Negative for discharge, double vision and pain.  Cardiovascular: Positive for chest pain. Negative for claudication, dyspnea on exertion, leg swelling, near-syncope, orthopnea, palpitations, paroxysmal nocturnal dyspnea and syncope.  Respiratory: Negative for hemoptysis and shortness of breath.   Musculoskeletal: Negative for muscle cramps and myalgias.  Gastrointestinal: Negative for abdominal pain, constipation, diarrhea, hematemesis, hematochezia, melena, nausea and vomiting.  Neurological: Negative for dizziness and light-headedness.    PHYSICAL EXAM: Vitals with BMI 11/15/2019 08/13/2019 10/10/2018  Height 5' 1.5" 5' 1.5" 5' 1.5"  Weight 120 lbs 120 lbs 11 oz 122 lbs 6 oz  BMI 22.31 78.29 56.21  Systolic 308 657 846  Diastolic 75 65 72  Pulse 93 77 69   CONSTITUTIONAL: Well-developed and well-nourished. No acute distress.  SKIN: Skin is warm and dry. No rash noted. No cyanosis. No pallor. No jaundice HEAD: Normocephalic and atraumatic.  EYES: No scleral icterus MOUTH/THROAT: Moist oral membranes.  NECK: No JVD present. No thyromegaly noted. No carotid bruits  LYMPHATIC: No visible cervical adenopathy.  CHEST Normal respiratory effort. No intercostal retractions  LUNGS: Clear to auscultation bilaterally.  No stridor. No wheezes. No rales.  CARDIOVASCULAR: Regular rate and rhythm, positive S1-S2, no murmurs rubs or  gallops appreciated ABDOMINAL: No apparent ascites.  EXTREMITIES: No peripheral edema  HEMATOLOGIC: No significant bruising NEUROLOGIC: Oriented to person, place, and time. Nonfocal. Normal muscle tone.  PSYCHIATRIC: Normal mood and affect. Normal behavior. Cooperative  CARDIAC DATABASE: EKG: 11/15/2019: Normal sinus rhythm, 79 bpm, normal axis, ST depressions in the inferior and lateral leads suggestive of possible ischemia.  Prior EKG dated 07/31/2018 noted normal sinus with PVCs and nonspecific ST abnormality.  Echocardiogram: None  Stress Testing: GXT 03/11/2014: Patient exercised for 4 minutes and 4 seconds, achieved 102% of maximum predicted heart rate, 4.9 METS, during exercise there were 3 mm ST depressions in the inferior lateral leads.  The study was reported to be equivocal for ischemia patient recommended to do exercise Myoview.  Heart Catheterization: None  LABORATORY DATA: CBC Latest Ref Rng & Units 07/31/2018  WBC 4.0 - 10.5 K/uL 6.5  Hemoglobin 12.0 - 15.0 g/dL 13.6  Hematocrit 36.0 - 46.0 % 41.4  Platelets 150 - 400 K/uL 268    CMP Latest Ref Rng & Units 07/31/2018  Glucose 70 - 99  mg/dL 98  BUN 8 - 23 mg/dL 15  Creatinine 0.44 - 1.00 mg/dL 0.83  Sodium 135 - 145 mmol/L 134(L)  Potassium 3.5 - 5.1 mmol/L 3.6  Chloride 98 - 111 mmol/L 100  CO2 22 - 32 mmol/L 27  Calcium 8.9 - 10.3 mg/dL 9.6   External Labs: Collected: 10/11/2019 Creatinine 0.8 mg/dL. eGFR: 69 mL/min per 1.73 m Lipid profile: Total cholesterol 169, triglycerides 54, HDL 79, LDL 79, non-HDL 90 Hemoglobin A1c: None  IMPRESSION:    ICD-10-CM   1. Angina pectoris (HCC)  I20.9 EKG 12-Lead    PCV MYOCARDIAL PERFUSION WITH LEXISCAN    nitroGLYCERIN (NITROSTAT) 0.4 MG SL tablet    PCV ECHOCARDIOGRAM COMPLETE  2. Benign hypertension  I10   3. Mixed hyperlipidemia  E78.2      RECOMMENDATIONS: ROYCE SCIARA is a 79 y.o. female whose past medical history and cardiac risk factors include:  Hypertension, hyperlipidemia, history of breast cancer, postmenopausal female, advanced age.  Angina pectoris:  Patient had an EKG which shows sinus rhythm with mild ST depression in the inferolateral leads suggestive of possible underlying ischemia.  Prior EKGs noted nonspecific ST-T changes.  Currently at the time of evaluation patient is chest pain-free.  Nuclear stress test recommended to evaluate for reversible ischemia.  Echocardiogram will be ordered to evaluate for structural heart disease and left ventricular systolic function.  Sublingual nitroglycerin tablet for chest pain.  Medication profile discussed with the patient.  Patient is instructed to seek medical attention to the nearest ER via EMS if she has chest pain that increases in intensity, frequency, and/or duration.  She verbalizes understanding.  Benign essential hypertension: Patient's blood pressure in the office is not at goal.  She states that she has whitecoat hypertension.  Her blood pressure at home is better controlled.  She is asked to keep a log of her blood pressures and to take it to her next PCP appointment.  Mixed hyperlipidemia: Continue statin therapy.  Most recent lipid profile reviewed.  Currently managed by PCP.   FINAL MEDICATION LIST END OF ENCOUNTER: Meds ordered this encounter  Medications  . nitroGLYCERIN (NITROSTAT) 0.4 MG SL tablet    Sig: Place 1 tablet (0.4 mg total) under the tongue every 5 (five) minutes as needed for chest pain. If you require more than two tablets five minutes apart go to the nearest ER via EMS.    Dispense:  90 tablet    Refill:  0    Medications Discontinued During This Encounter  Medication Reason  . vitamin C (ASCORBIC ACID) 500 MG tablet Patient Preference     Current Outpatient Medications:  .  ALPRAZolam (XANAX) 0.25 MG tablet, Take 0.25 mg by mouth 2 (two) times daily as needed for anxiety., Disp: , Rfl:  .  anastrozole (ARIMIDEX) 1 MG tablet, Take 1 tablet  (1 mg total) by mouth daily., Disp: 90 tablet, Rfl: 3 .  aspirin EC 81 MG tablet, Take 81 mg by mouth daily., Disp: , Rfl:  .  atorvastatin (LIPITOR) 10 MG tablet, Take 10 mg by mouth daily. , Disp: , Rfl:  .  Calcium Carb-Cholecalciferol (CALCIUM 600 + D PO), Take 1 tablet by mouth 2 (two) times daily., Disp: , Rfl:  .  cetirizine (ZYRTEC) 10 MG tablet, Take 10 mg by mouth daily., Disp: , Rfl:  .  cholecalciferol (VITAMIN D3) 25 MCG (1000 UT) tablet, Take 1,000 Units by mouth daily., Disp: , Rfl:  .  co-enzyme Q-10 30 MG  capsule, Take 100 mg by mouth daily., Disp: , Rfl:  .  EPINEPHrine 0.3 mg/0.3 mL IJ SOAJ injection, INJECT INTO OUTER THIGH AS NEEDED FOR SYSTEMIC REACTIONS, Disp: , Rfl:  .  omeprazole (PRILOSEC) 20 MG capsule, Take 20 mg by mouth daily., Disp: , Rfl:  .  telmisartan-hydrochlorothiazide (MICARDIS HCT) 80-12.5 MG tablet, Take 1 tablet by mouth daily., Disp: , Rfl:  .  nitroGLYCERIN (NITROSTAT) 0.4 MG SL tablet, Place 1 tablet (0.4 mg total) under the tongue every 5 (five) minutes as needed for chest pain. If you require more than two tablets five minutes apart go to the nearest ER via EMS., Disp: 90 tablet, Rfl: 0  Orders Placed This Encounter  Procedures  . PCV MYOCARDIAL PERFUSION WITH LEXISCAN  . EKG 12-Lead  . PCV ECHOCARDIOGRAM COMPLETE   --Continue cardiac medications as reconciled in final medication list. --Return in about 4 weeks (around 12/13/2019) for review test results., re-evaluation of symptoms.. Or sooner if needed. --Continue follow-up with your primary care physician regarding the management of your other chronic comorbid conditions.  Patient's questions and concerns were addressed to her satisfaction. She voices understanding of the instructions provided during this encounter.   This note was created using a voice recognition software as a result there may be grammatical errors inadvertently enclosed that do not reflect the nature of this encounter. Every  attempt is made to correct such errors.  Rex Kras, [place], [facility]  Pager: [phone] Office: [phone]

## 2019-11-19 ENCOUNTER — Other Ambulatory Visit: Payer: Self-pay

## 2019-11-19 ENCOUNTER — Ambulatory Visit: Payer: PPO

## 2019-11-19 DIAGNOSIS — I209 Angina pectoris, unspecified: Secondary | ICD-10-CM | POA: Diagnosis not present

## 2019-11-21 ENCOUNTER — Ambulatory Visit: Payer: PPO

## 2019-11-21 ENCOUNTER — Other Ambulatory Visit: Payer: Self-pay

## 2019-11-21 DIAGNOSIS — I209 Angina pectoris, unspecified: Secondary | ICD-10-CM

## 2019-11-26 DIAGNOSIS — G4700 Insomnia, unspecified: Secondary | ICD-10-CM | POA: Diagnosis not present

## 2019-11-26 DIAGNOSIS — I10 Essential (primary) hypertension: Secondary | ICD-10-CM | POA: Diagnosis not present

## 2019-12-13 ENCOUNTER — Other Ambulatory Visit: Payer: Self-pay

## 2019-12-13 ENCOUNTER — Encounter: Payer: Self-pay | Admitting: Cardiology

## 2019-12-13 ENCOUNTER — Ambulatory Visit: Payer: PPO | Admitting: Cardiology

## 2019-12-13 VITALS — BP 146/79 | HR 80 | Resp 17 | Ht 61.5 in | Wt 121.0 lb

## 2019-12-13 DIAGNOSIS — I209 Angina pectoris, unspecified: Secondary | ICD-10-CM | POA: Diagnosis not present

## 2019-12-13 DIAGNOSIS — I10 Essential (primary) hypertension: Secondary | ICD-10-CM | POA: Diagnosis not present

## 2019-12-13 DIAGNOSIS — E782 Mixed hyperlipidemia: Secondary | ICD-10-CM

## 2019-12-13 DIAGNOSIS — Z712 Person consulting for explanation of examination or test findings: Secondary | ICD-10-CM | POA: Diagnosis not present

## 2019-12-13 NOTE — Progress Notes (Signed)
Kelly Blackwell Date of Birth: 07/22/1940 MRN: [id number] Cardiologist:  Mechele Claude [facility] (established care 11/15/2019) Former Cardiology Providers: Dr. Adrian Prows  Date: 12/15/19 Last Office Visit: 11/15/2019  Chief Complaint  Patient presents with  . Chest Pain  . Follow-up    4 week  . Results    echo and lexi    HPI  Kelly Blackwell is a 79 y.o. female who presents to the office with a  chief complaint of " review test results and reevaluation of chest pain."  Her past medical history and cardiovascular risk factors are: Hypertension, hyperlipidemia, history of breast cancer, postmenopausal female, advanced age.   Patient was originally referred to the office for evaluation of chest pain at the request of her primary care provider back in April 2021.   At the last office visit, patient symptoms were concerning for angina pectoris and EKG showed mild ST depressions in the inferolateral leads.  In the past patient underwent a GXT and the study was noted to be equivocal and therefore was recommended to undergo nuclear stress test at that time but this did not happen.  At last office visit I did order a nuclear stress test which noted normal myocardial perfusion and reported to be a low risk study.  Findings noted below for further reference.  Since last visit patient states that she does not have any chest pain like she did in the past.  She has not required any sublingual nitroglycerin tablets.  And she did follow-up with her primary care provider given her elevated blood pressures and was started on Norvasc and her blood pressures at home are improving.  She also has an upcoming appointment with sleep medicine on December 24, 2019 for possible sleep study.  No history of premature coronary artery disease in the family or sudden cardiac death.  Denies prior history of coronary artery disease, myocardial infarction, congestive heart failure, deep venous thrombosis, pulmonary embolism,  stroke, transient ischemic attack.  FUNCTIONAL STATUS: She walks 5 days a week for 30-45 minutes.    ALLERGIES: Allergies  Allergen Reactions  . Beef Allergy Diarrhea and Nausea And Vomiting  . Sulfa Antibiotics     Fever and rash     MEDICATION LIST PRIOR TO VISIT: Current Meds  Medication Sig  . ALPRAZolam (XANAX) 0.25 MG tablet Take 0.25 mg by mouth 2 (two) times daily as needed for anxiety.  Marland Kitchen amLODipine (NORVASC) 5 MG tablet Take 5 mg by mouth daily.  Marland Kitchen anastrozole (ARIMIDEX) 1 MG tablet Take 1 tablet (1 mg total) by mouth daily.  Marland Kitchen aspirin EC 81 MG tablet Take 81 mg by mouth daily.  Marland Kitchen atorvastatin (LIPITOR) 10 MG tablet Take 10 mg by mouth daily.   . Calcium Carb-Cholecalciferol (CALCIUM 600 + D PO) Take 1 tablet by mouth 2 (two) times daily.  . cetirizine (ZYRTEC) 10 MG tablet Take 10 mg by mouth daily.  . cholecalciferol (VITAMIN D3) 25 MCG (1000 UT) tablet Take 1,000 Units by mouth daily.  Marland Kitchen co-enzyme Q-10 30 MG capsule Take 100 mg by mouth daily.  Marland Kitchen omeprazole (PRILOSEC) 20 MG capsule Take 20 mg by mouth daily.  Marland Kitchen telmisartan-hydrochlorothiazide (MICARDIS HCT) 80-12.5 MG tablet Take 1 tablet by mouth daily.     PAST MEDICAL HISTORY: Past Medical History:  Diagnosis Date  . Allergy    seasonal allergies.   . Anxiety   . Arthritis    Neck  . Breast cancer ([place])   . Hypercholesteremia   .  Hypertension   . Osteopenia   . Personal history of radiation therapy     PAST SURGICAL HISTORY: Past Surgical History:  Procedure Laterality Date  . APPENDECTOMY     age 52  . BREAST LUMPECTOMY Right 08/03/2018  . BREAST LUMPECTOMY WITH RADIOACTIVE SEED AND SENTINEL LYMPH NODE BIOPSY Right 08/03/2018   Procedure: RIGHT BREAST LUMPECTOMY WITH RADIOACTIVE SEED AND SENTINEL LYMPH NODE BIOPSY WITH BLUE DYE INJECTION;  Surgeon: Donnie Mesa, MD;  Location: [facility];  Service: General;  Laterality: Right;  . bunionectmy     1986  . CHOLECYSTECTOMY     age 37  . TONSILLECTOMY        FAMILY HISTORY: The patient family history includes AAA (abdominal aortic aneurysm) in her father; Atrial fibrillation in her mother; Colon cancer in her father; Coronary artery disease in her father; Heart failure in her mother; Hypertension in her mother; Osteoporosis in her mother.  SOCIAL HISTORY:  The patient  reports that she has never smoked. She has never used smokeless tobacco. She reports previous alcohol use. She reports that she does not use drugs.  REVIEW OF SYSTEMS: Review of Systems  Constitution: Negative for chills and fever.  HENT: Negative for hoarse voice and nosebleeds.   Eyes: Negative for discharge, double vision and pain.  Cardiovascular: Negative for chest pain, claudication, dyspnea on exertion, leg swelling, near-syncope, orthopnea, palpitations, paroxysmal nocturnal dyspnea and syncope.  Respiratory: Negative for hemoptysis and shortness of breath.   Musculoskeletal: Negative for muscle cramps and myalgias.  Gastrointestinal: Negative for abdominal pain, constipation, diarrhea, hematemesis, hematochezia, melena, nausea and vomiting.  Neurological: Negative for dizziness and light-headedness.    PHYSICAL EXAM: Vitals with BMI 12/13/2019 11/15/2019 08/13/2019  Height 5' 1.5" 5' 1.5" 5' 1.5"  Weight 121 lbs 120 lbs 120 lbs 11 oz  BMI 22.5 19.62 22.97  Systolic 989 211 941  Diastolic 79 75 65  Pulse 80 93 77   CONSTITUTIONAL: Well-developed and well-nourished. No acute distress.  SKIN: Skin is warm and dry. No rash noted. No cyanosis. No pallor. No jaundice HEAD: Normocephalic and atraumatic.  EYES: No scleral icterus MOUTH/THROAT: Moist oral membranes.  NECK: No JVD present. No thyromegaly noted. No carotid bruits  LYMPHATIC: No visible cervical adenopathy.  CHEST Normal respiratory effort. No intercostal retractions  LUNGS: Clear to auscultation bilaterally.  No stridor. No wheezes. No rales.  CARDIOVASCULAR: Regular rate and rhythm, positive S1-S2, no  murmurs rubs or gallops appreciated ABDOMINAL: No apparent ascites.  EXTREMITIES: No peripheral edema  HEMATOLOGIC: No significant bruising NEUROLOGIC: Oriented to person, place, and time. Nonfocal. Normal muscle tone.  PSYCHIATRIC: Normal mood and affect. Normal behavior. Cooperative  CARDIAC DATABASE: EKG: 11/15/2019: Normal sinus rhythm, 79 bpm, normal axis, ST depressions in the inferior and lateral leads suggestive of possible ischemia.  Prior EKG dated 07/31/2018 noted normal sinus with PVCs and nonspecific ST abnormality.  Echocardiogram: 11/21/2019: LVEF 60 to 65%, mild LVH, indeterminate diastolic filling pattern, elevated left atrial pressure, mild MR, mild TR.  Stress Testing: Lexiscan (Walking with mod Bruce)Tetrofosmin Stress Test 11/19/2019:  Myocardial perfusion is normal.  Overall LV systolic function is normal without regional wall motion abnormalities.  Stress LV EF: 61%.  No previous exam available for comparison. Low risk study.   Heart Catheterization: None  LABORATORY DATA: CBC Latest Ref Rng & Units 07/31/2018  WBC 4.0 - 10.5 K/uL 6.5  Hemoglobin 12.0 - 15.0 g/dL 13.6  Hematocrit 36.0 - 46.0 % 41.4  Platelets 150 - 400 K/uL  268    CMP Latest Ref Rng & Units 07/31/2018  Glucose 70 - 99 mg/dL 98  BUN 8 - 23 mg/dL 15  Creatinine 0.44 - 1.00 mg/dL 0.83  Sodium 135 - 145 mmol/L 134(L)  Potassium 3.5 - 5.1 mmol/L 3.6  Chloride 98 - 111 mmol/L 100  CO2 22 - 32 mmol/L 27  Calcium 8.9 - 10.3 mg/dL 9.6   External Labs: Collected: 10/11/2019 Creatinine 0.8 mg/dL. eGFR: 69 mL/min per 1.73 m Lipid profile: Total cholesterol 169, triglycerides 54, HDL 79, LDL 79, non-HDL 90 Hemoglobin A1c: None  IMPRESSION:    ICD-10-CM   1. Encounter to discuss test results  Z71.2   2. Angina pectoris (HCC)  I20.9   3. Benign hypertension  I10   4. Mixed hyperlipidemia  E78.2      RECOMMENDATIONS: Kelly Blackwell is a 79 y.o. female whose past medical history and  cardiac risk factors include: Hypertension, hyperlipidemia, history of breast cancer, postmenopausal female, advanced age.  Angina pectoris: Resolved At the last office visit patient's symptoms were concerning for angina pectoris and EKG showed ST depressions in the lateral leads and therefore was recommended to undergo an echocardiogram and nuclear stress test.  Both of the results were reviewed with the patient in great detail at today's visit and findings noted above for further reference.   No use of sublingual nitroglycerin tablet since last office visit.   In addition, since last visit she started on Norvasc for her blood pressure management and states that her blood pressures have improved at home.     Benign essential hypertension: Patient's blood pressure in the office is not at goal.  She states that she has whitecoat hypertension.  Since last visit patient has been started on Norvasc and her home blood pressures have improved.  If needed, may consider uptitrating hydrochlorothiazide as the echocardiogram noted elevated left atrial pressure may help improve her symptoms.  Will defer additional titration of BP medications to her primary care provider.  Patient is also scheduled for evaluation with sleep medicine for possible sleep study.   Mixed hyperlipidemia: Continue statin therapy.  Most recent lipid profile reviewed.  Currently managed by PCP.   FINAL MEDICATION LIST END OF ENCOUNTER: No orders of the defined types were placed in this encounter.   There are no discontinued medications.   Current Outpatient Medications:  .  ALPRAZolam (XANAX) 0.25 MG tablet, Take 0.25 mg by mouth 2 (two) times daily as needed for anxiety., Disp: , Rfl:  .  amLODipine (NORVASC) 5 MG tablet, Take 5 mg by mouth daily., Disp: , Rfl:  .  anastrozole (ARIMIDEX) 1 MG tablet, Take 1 tablet (1 mg total) by mouth daily., Disp: 90 tablet, Rfl: 3 .  aspirin EC 81 MG tablet, Take 81 mg by mouth daily., Disp: ,  Rfl:  .  atorvastatin (LIPITOR) 10 MG tablet, Take 10 mg by mouth daily. , Disp: , Rfl:  .  Calcium Carb-Cholecalciferol (CALCIUM 600 + D PO), Take 1 tablet by mouth 2 (two) times daily., Disp: , Rfl:  .  cetirizine (ZYRTEC) 10 MG tablet, Take 10 mg by mouth daily., Disp: , Rfl:  .  cholecalciferol (VITAMIN D3) 25 MCG (1000 UT) tablet, Take 1,000 Units by mouth daily., Disp: , Rfl:  .  co-enzyme Q-10 30 MG capsule, Take 100 mg by mouth daily., Disp: , Rfl:  .  omeprazole (PRILOSEC) 20 MG capsule, Take 20 mg by mouth daily., Disp: , Rfl:  .  telmisartan-hydrochlorothiazide (MICARDIS HCT) 80-12.5 MG tablet, Take 1 tablet by mouth daily., Disp: , Rfl:  .  EPINEPHrine 0.3 mg/0.3 mL IJ SOAJ injection, INJECT INTO OUTER THIGH AS NEEDED FOR SYSTEMIC REACTIONS, Disp: , Rfl:  .  nitroGLYCERIN (NITROSTAT) 0.4 MG SL tablet, Place 1 tablet (0.4 mg total) under the tongue every 5 (five) minutes as needed for chest pain. If you require more than two tablets five minutes apart go to the nearest ER via EMS. (Patient not taking: Reported on 12/13/2019), Disp: 90 tablet, Rfl: 0  No orders of the defined types were placed in this encounter.  --Continue cardiac medications as reconciled in final medication list. --Return in about 6 months (around 06/14/2020) for review test results.. Or sooner if needed. --Continue follow-up with your primary care physician regarding the management of your other chronic comorbid conditions.  Patient's questions and concerns were addressed to her satisfaction. She voices understanding of the instructions provided during this encounter.   This note was created using a voice recognition software as a result there may be grammatical errors inadvertently enclosed that do not reflect the nature of this encounter. Every attempt is made to correct such errors.  Rex Kras, [place], [facility]  Pager: [phone] Office: [phone]

## 2019-12-15 ENCOUNTER — Encounter: Payer: Self-pay | Admitting: Cardiology

## 2019-12-18 DIAGNOSIS — G4733 Obstructive sleep apnea (adult) (pediatric): Secondary | ICD-10-CM

## 2019-12-18 HISTORY — DX: Obstructive sleep apnea (adult) (pediatric): G47.33

## 2019-12-24 ENCOUNTER — Other Ambulatory Visit: Payer: Self-pay

## 2019-12-24 ENCOUNTER — Encounter: Payer: Self-pay | Admitting: Neurology

## 2019-12-24 ENCOUNTER — Ambulatory Visit: Payer: PPO | Admitting: Neurology

## 2019-12-24 VITALS — BP 123/76 | HR 76 | Ht 61.5 in | Wt 122.0 lb

## 2019-12-24 DIAGNOSIS — C50211 Malignant neoplasm of upper-inner quadrant of right female breast: Secondary | ICD-10-CM | POA: Diagnosis not present

## 2019-12-24 DIAGNOSIS — Z170 Estrogen receptor positive status [ER+]: Secondary | ICD-10-CM | POA: Diagnosis not present

## 2019-12-24 DIAGNOSIS — I10 Essential (primary) hypertension: Secondary | ICD-10-CM | POA: Diagnosis not present

## 2019-12-24 DIAGNOSIS — R0683 Snoring: Secondary | ICD-10-CM

## 2019-12-24 DIAGNOSIS — I208 Other forms of angina pectoris: Secondary | ICD-10-CM | POA: Diagnosis not present

## 2019-12-24 NOTE — Patient Instructions (Signed)
Quality Sleep Information, Adult Quality sleep is important for your mental and physical health. It also improves your quality of life. Quality sleep means you:  Are asleep for most of the time you are in bed.  Fall asleep within 30 minutes.  Wake up no more than once a night.  Are awake for no longer than 20 minutes if you do wake up during the night. Most adults need 7-8 hours of quality sleep each night. How can poor sleep affect me? If you do not get enough quality sleep, you may have:  Mood swings.  Daytime sleepiness.  Confusion.  Decreased reaction time.  Sleep disorders, such as insomnia and sleep apnea.  Difficulty with: ? Solving problems. ? Coping with stress. ? Paying attention. These issues may affect your performance and productivity at work, school, and at home. Lack of sleep may also put you at higher risk for accidents, suicide, and risky behaviors. If you do not get quality sleep you may also be at higher risk for several health problems, including:  Infections.  Type 2 diabetes.  Heart disease.  High blood pressure.  Obesity.  Worsening of long-term conditions, like arthritis, kidney disease, depression, Parkinson's disease, and epilepsy. What actions can I take to get more quality sleep?      Stick to a sleep schedule. Go to sleep and wake up at about the same time each day. Do not try to sleep less on weekdays and make up for lost sleep on weekends. This does not work.  Try to get about 30 minutes of exercise on most days. Do not exercise 2-3 hours before going to bed.  Limit naps during the day to 30 minutes or less.  Do not use any products that contain nicotine or tobacco, such as cigarettes or e-cigarettes. If you need help quitting, ask your health care provider.  Do not drink caffeinated beverages for at least 8 hours before going to bed. Coffee, tea, and some sodas contain caffeine.  Do not drink alcohol close to bedtime.  Do not  eat large meals close to bedtime.  Do not take naps in the late afternoon.  Try to get at least 30 minutes of sunlight every day. Morning sunlight is best.  Make time to relax before bed. Reading, listening to music, or taking a hot bath promotes quality sleep.  Make your bedroom a place that promotes quality sleep. Keep your bedroom dark, quiet, and at a comfortable room temperature. Make sure your bed is comfortable. Take out sleep distractions like TV, a computer, smartphone, and bright lights.  If you are lying awake in bed for longer than 20 minutes, get up and do a relaxing activity until you feel sleepy.  Work with your health care provider to treat medical conditions that may affect sleeping, such as: ? Nasal obstruction. ? Snoring. ? Sleep apnea and other sleep disorders.  Talk to your health care provider if you think any of your prescription medicines may cause you to have difficulty falling or staying asleep.  If you have sleep problems, talk with a sleep consultant. If you think you have a sleep disorder, talk with your health care provider about getting evaluated by a specialist. Where to find more information  National Sleep Foundation website: https://sleepfoundation.org  National Heart, Lung, and Blood Institute (NHLBI): www.nhlbi.nih.gov/files/docs/public/sleep/healthy_sleep.pdf  Centers for Disease Control and Prevention (CDC): www.cdc.gov/sleep/index.html Contact a health care provider if you:  Have trouble getting to sleep or staying asleep.  Often wake up   very early in the morning and cannot get back to sleep.  Have daytime sleepiness.  Have daytime sleep attacks of suddenly falling asleep and sudden muscle weakness (narcolepsy).  Have a tingling sensation in your legs with a strong urge to move your legs (restless legs syndrome).  Stop breathing briefly during sleep (sleep apnea).  Think you have a sleep disorder or are taking a medicine that is  affecting your quality of sleep. Summary  Most adults need 7-8 hours of quality sleep each night.  Getting enough quality sleep is an important part of health and well-being.  Make your bedroom a place that promotes quality sleep and avoid things that may cause you to have poor sleep, such as alcohol, caffeine, smoking, and large meals.  Talk to your health care provider if you have trouble falling asleep or staying asleep. This information is not intended to replace advice given to you by your health care provider. Make sure you discuss any questions you have with your health care provider. Document Revised: 10/12/2017 Document Reviewed: 10/12/2017 Elsevier Patient Education  2020 Elsevier Inc. Screening for Sleep Apnea  Sleep apnea is a condition in which breathing pauses or becomes shallow during sleep. Sleep apnea screening is a test to determine if you are at risk for sleep apnea. The test is easy and only takes a few minutes. Your health care provider may ask you to have this test in preparation for surgery or as part of a physical exam. What are the symptoms of sleep apnea? Common symptoms of sleep apnea include:  Snoring.  Restless sleep.  Daytime sleepiness.  Pauses in breathing.  Choking during sleep.  Irritability.  Forgetfulness.  Trouble thinking clearly.  Depression.  Personality changes. Most people with sleep apnea are not aware that they have it. Why should I get screened? Getting screened for sleep apnea can help:  Ensure your safety. It is important for your health care providers to know whether or not you have sleep apnea, especially if you are having surgery or have other long-term (chronic) health conditions.  Improve your health and allow you to get a better night's rest. Restful sleep can help you: ? Have more energy. ? Lose weight. ? Improve high blood pressure. ? Improve diabetes management. ? Prevent stroke. ? Prevent car accidents. How  is screening done? Screening usually includes being asked a list of questions about your sleep quality. Some questions you may be asked include:  Do you snore?  Is your sleep restless?  Do you have daytime sleepiness?  Has a partner or spouse told you that you stop breathing during sleep?  Have you had trouble concentrating or memory loss? If your screening test is positive, you are at risk for the condition. Further testing may be needed to confirm a diagnosis of sleep apnea. Where to find more information You can find screening tools online or at your health care clinic. For more information about sleep apnea screening and healthy sleep, visit these websites:  Centers for Disease Control and Prevention: www.cdc.gov/sleep/index.html  American Sleep Apnea Association: www.sleepapnea.org Contact a health care provider if:  You think that you may have sleep apnea. Summary  Sleep apnea screening can help determine if you are at risk for sleep apnea.  It is important for your health care providers to know whether or not you have sleep apnea, especially if you are having surgery or have other chronic health conditions.  You may be asked to take a screening test for   sleep apnea in preparation for surgery or as part of a physical exam. This information is not intended to replace advice given to you by your health care provider. Make sure you discuss any questions you have with your health care provider. Document Revised: 04/21/2018 Document Reviewed: 10/15/2016 Elsevier Patient Education  2020 Elsevier Inc.  

## 2019-12-24 NOTE — Progress Notes (Addendum)
SLEEP MEDICINE CLINIC    Provider:  Larey Seat, MD  Primary Care Physician:  Leanna Battles, MD [place] [address]     Referring Provider: Leanna Battles, [address] [address],  [place] [address]          Chief Complaint according to patient   Patient presents with:    . New Patient (Initial Visit)     alone here for sleep consult. no prior sleep study, snoring is present. Pt reports PCP wanted to rule out osa as a cause for increase in BP       HISTORY OF PRESENT ILLNESS:  Kelly Blackwell is a 79 year- old Caucasian female patient seen here as a referral on 12/24/2019 . Chief concern according to patient : I have higher blood pressures than ever. I snore.    I have the pleasure of seeing Kelly Blackwell today, a right -handed White or Caucasian female with a possible sleep disorder.  She has a past medical history of Allergy, Anxiety, Arthritis, Breast cancer ([place]), Hypercholesteremia, Hypertension, Osteopenia, and Personal history of radiation therapy. she under went lumpectomy January 2020 and radiation in March 2020.    Sleep relevant medical history: Nocturia 1-2 times at night. She has sometimes nightmares, she lives with her disabled husband in a retirement village. She is used take a xanax at night- ever since her grandsons mental break down.  She switched to melatonin 5 mg at night. Husband is my sleep apnea patient.   PCP:  Leanna Battles, MD    Cardiologist:  Rex Kras, DO, [facility] (established care 11/15/2019) Former Cardiology Providers: Dr. Adrian Prows  REASON FOR CONSULT: Chest Pain   REQUESTING PHYSICIAN:  Leanna Battles, MD [address] [place],  [place] [address]      Chief Complaint  Patient presents with  . New Patient (Initial Visit)    Referred by Dr. Philip Aspen  . Chest Pain    HPI  Kelly Blackwell is a 79 y.o. female who is being seen today for the evaluation of chest pain at the request of Leanna Battles, MD. Patient's past medical history and cardiac risk factors include: Hypertension, hyperlipidemia, history of breast cancer, postmenopausal female, recent BP in the 180s.   Patient states that she has been having chest discomfort that has been going on for the last several months.  Initially she had similar discomfort which was attributed to heartburn and was started on omeprazole and did well for couple years.  However recently they have resurfaced.  The pain is located substernally, radiates to bilateral shoulders, 7 out of 10, last for less than 5 minutes, occurs at least once a month, improves with rest.  About 1 month ago patient states that she had chest pain with effort related activity.  She parked her car and went to the grocery store by the time she got into the store she was having substernal chest pain which resolved with rest.  In the past patient has had a treadmill stress test which was reported to be equivocal and was referred for nuclear stress test because of her symptoms improved this never took place.  No history of premature coronary artery disease in the family or sudden cardiac death.  Denies prior history of coronary artery disease, myocardial infarction, congestive heart failure, deep venous thrombosis, pulmonary embolism, stroke, transient ischemic attack.    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  and lives in a household with 1 person/ husband has spinal stenosis, has obesity and OSA- . Family status is married , with 3 adult sons. 6 grandchildren.  The patient used to work in shifts( Presenter, broadcasting,) Pets are not present. Tobacco use: not.  ETOH use : 4/ a month, Caffeine intake in form of Coffee( in AM ) Soda( rare ) Tea ( seldomly) , nor energy drinks. Regular exercise in form of walking.      Sleep habits are as follows: The patient's dinner time is between 5-6 PM. The patient goes to bed at  9.30-10 PM and continues to sleep for 7-8 hours, wakes for 1 bathroom break.    The preferred sleep position is lateral, with the support of a neck pillow . Dreams are reportedly frequent.  6  AM is the usual rise time. The patient wakes up spontaneously at 5.30. she wears a mouth guard for Bruxism protection.  She reports mostly  feeling refreshed and  restored in AM, with symptoms such as dry mouth and some residual fatigue.  Naps are taken infrequently, lasting from 20 to 30 minutes.   Review of Systems: Out of a complete 14 system review, the patient complains of only the following symptoms, and all other reviewed systems are negative.:  Fatigue, sleepiness , snoring, fragmented sleep, Insomnia.   How likely are you to doze in the following situations: 0 = not likely, 1 = slight chance, 2 = moderate chance, 3 = high chance   Sitting and Reading? Watching Television? Sitting inactive in a public place (theater or meeting)? As a passenger in a car for an hour without a break? Lying down in the afternoon when circumstances permit? Sitting and talking to someone? Sitting quietly after lunch without alcohol? In a car, while stopped for a few minutes in traffic?   Total = 04/ 24 points   FSS endorsed at 19/ 63 points.   Social History   Socioeconomic History  . Marital status: Married    Spouse name: Not on file  . Number of children: 3  . Years of education: Not on file  . Highest education level: Not on file  Occupational History  . Not on file  Tobacco Use  . Smoking status: Never Smoker  . Smokeless tobacco: Never Used  Substance and Sexual Activity  . Alcohol use: Not Currently    Comment: occasionally, a couple of times a month.   . Drug use: Never  . Sexual activity: Not on file  Other Topics Concern  . Not on file  Social History Narrative  . Not on file   Social Determinants of Health   Financial Resource Strain:   . Difficulty of Paying Living Expenses:     Food Insecurity:   . Worried About Charity fundraiser in the Last Year:   . Arboriculturist in the Last Year:   Transportation Needs:   . Film/video editor (Medical):   Marland Kitchen Lack of Transportation (Non-Medical):   Physical Activity:   . Days of Exercise per Week:   . Minutes of Exercise per Session:   Stress:   . Feeling of Stress :   Social Connections:   . Frequency of Communication with Friends and Family:   . Frequency of Social Gatherings with Friends and Family:   . Attends Religious Services:   . Active Member of Clubs or Organizations:   . Attends Archivist Meetings:   .  Marital Status:     Family History  Problem Relation Age of Onset  . Hypertension Mother   . Osteoporosis Mother   . Atrial fibrillation Mother   . Heart failure Mother   . Colon cancer Father   . Coronary artery disease Father   . AAA (abdominal aortic aneurysm) Father     Past Medical History:  Diagnosis Date  . Allergy    seasonal allergies.   . Anxiety   . Arthritis    Neck  . Breast cancer ([place])   . Hypercholesteremia   . Hypertension   . Osteopenia   . Personal history of radiation therapy     Past Surgical History:  Procedure Laterality Date  . APPENDECTOMY     age 78  . BREAST LUMPECTOMY Right 08/03/2018  . BREAST LUMPECTOMY WITH RADIOACTIVE SEED AND SENTINEL LYMPH NODE BIOPSY Right 08/03/2018   Procedure: RIGHT BREAST LUMPECTOMY WITH RADIOACTIVE SEED AND SENTINEL LYMPH NODE BIOPSY WITH BLUE DYE INJECTION;  Surgeon: Donnie Mesa, MD;  Location: [facility];  Service: General;  Laterality: Right;  . bunionectmy     1986  . CHOLECYSTECTOMY     age 70  . TONSILLECTOMY       Current Outpatient Medications on File Prior to Visit  Medication Sig Dispense Refill  . ALPRAZolam (XANAX) 0.25 MG tablet Take 0.25 mg by mouth 2 (two) times daily as needed for anxiety.    Marland Kitchen amLODipine (NORVASC) 5 MG tablet Take 5 mg by mouth daily.    Marland Kitchen anastrozole (ARIMIDEX) 1 MG tablet  Take 1 tablet (1 mg total) by mouth daily. 90 tablet 3  . aspirin EC 81 MG tablet Take 81 mg by mouth daily.    Marland Kitchen atorvastatin (LIPITOR) 10 MG tablet Take 10 mg by mouth daily.     . Calcium Carb-Cholecalciferol (CALCIUM 600 + D PO) Take 1 tablet by mouth 2 (two) times daily.    . cetirizine (ZYRTEC) 10 MG tablet Take 10 mg by mouth daily.    . cholecalciferol (VITAMIN D3) 25 MCG (1000 UT) tablet Take 1,000 Units by mouth daily.    Marland Kitchen co-enzyme Q-10 30 MG capsule Take 100 mg by mouth daily.    Marland Kitchen EPINEPHrine 0.3 mg/0.3 mL IJ SOAJ injection INJECT INTO OUTER THIGH AS NEEDED FOR SYSTEMIC REACTIONS    . nitroGLYCERIN (NITROSTAT) 0.4 MG SL tablet Place 1 tablet (0.4 mg total) under the tongue every 5 (five) minutes as needed for chest pain. If you require more than two tablets five minutes apart go to the nearest ER via EMS. 90 tablet 0  . omeprazole (PRILOSEC) 20 MG capsule Take 20 mg by mouth daily.    Marland Kitchen telmisartan-hydrochlorothiazide (MICARDIS HCT) 80-12.5 MG tablet Take 1 tablet by mouth daily.     No current facility-administered medications on file prior to visit.    Allergies  Allergen Reactions  . Beef Allergy Diarrhea and Nausea And Vomiting  . Sulfa Antibiotics     Fever and rash     Physical exam:  Today's Vitals   12/24/19 0954  BP: 123/76  Pulse: 76  Weight: 122 lb (55.3 kg)  Height: 5' 1.5" (1.562 m)   Body mass index is 22.68 kg/m.   Wt Readings from Last 3 Encounters:  12/24/19 122 lb (55.3 kg)  12/13/19 121 lb (54.9 kg)  11/15/19 120 lb (54.4 kg)     Ht Readings from Last 3 Encounters:  12/24/19 5' 1.5" (1.562 m)  12/13/19 5' 1.5" (1.562  m)  11/15/19 5' 1.5" (1.562 m)      General: The patient is awake, alert and appears not in acute distress. The patient is well groomed. Head: Normocephalic, atraumatic. Neck is supple. Mallampati 3,  neck circumference:13.5  inches . Nasal airflow patent.  Retrognathia is not  seen.  Dental status: biological teeth.   Cardiovascular:  Regular rate and cardiac rhythm by pulse,  without distended neck veins. Respiratory: Lungs are clear to auscultation.  Skin:  Without evidence of ankle edema, or rash. Trunk: The patient's posture is erect.   Neurologic exam : The patient is awake and alert, oriented to place and time.   Memory subjective described as intact.  Attention span & concentration ability appears normal.  Speech is fluent,  without  dysarthria, dysphonia or aphasia.  Mood and affect are appropriate.   Cranial nerves: no loss of smell or taste reported  Pupils are equal and briskly reactive to light. Funduscopic exam deferred.   Extraocular movements in vertical and horizontal planes were intact and without nystagmus. No Diplopia. Visual fields by finger perimetry are intact. Hearing was intact to soft voice and finger rubbing.    Facial sensation intact to fine touch.  Facial motor strength is symmetric and tongue and uvula move midline.  Neck ROM : rotation, tilt and flexion extension were normal for age and shoulder shrug was symmetrical.    Motor exam:  Symmetric bulk, tone and ROM.   Normal tone without cog wheeling, symmetric grip strength .   Sensory:  Fine touch, pinprick and vibration were normal.  Proprioception tested in the upper extremities was normal.   Coordination: Rapid alternating movements in the fingers/hands were of normal speed.  The Finger-to-nose maneuver was intact without evidence of ataxia, dysmetria or tremor.   Gait and station: Patient could rise unassisted from a seated position, walked without assistive device.  Stance is of normal width/ base and the patient turned with 3 steps.  Toe and heel walk were deferred.  Deep tendon reflexes: in the  upper and lower extremities are symmetric and intact.  Babinski response was deferred.     I reviewed her labs - she is in excellent shape.    After spending a total time of 45 minutes face to face and additional  time for physical and neurologic examination, review of laboratory studies,  personal review of imaging studies, reports and results of other testing and review of referral information / records as far as provided in visit, I have established the following assessments:  1)  There is a good chance that Mrs. Muff may have OSA .   This can be an explanation for the slowly increasing BP, the angina and the fatigue, but BP medication certainly has played a role She snores.   My Plan is to proceed with:  1) I will order PSG and HST , based on her cardiology report.    I would like to thank  Leanna Battles, [address] [place],  [place] [address] for allowing me to meet with and to take care of this pleasant patient.    I plan to follow up either personally or through our NP within 2-3  month.   CC: I will share my notes with PCP and cardiologist Dr. Karle Barr, DO .  Electronically signed by: Larey Seat, MD 12/24/2019 10:19 AM  [facility] Board certified by The [employer] of Sleep Medicine and Diplomate of the [employer] of Sleep  Medicine. Board certified In Neurology through the [place], Fellow of the [employer] of Neurology. Medical Director of [facility].

## 2019-12-27 ENCOUNTER — Telehealth: Payer: Self-pay

## 2019-12-27 NOTE — Telephone Encounter (Signed)
lvm to schedule HST

## 2020-02-10 ENCOUNTER — Other Ambulatory Visit: Payer: Self-pay | Admitting: Cardiology

## 2020-02-10 DIAGNOSIS — I209 Angina pectoris, unspecified: Secondary | ICD-10-CM

## 2020-02-11 ENCOUNTER — Ambulatory Visit (INDEPENDENT_AMBULATORY_CARE_PROVIDER_SITE_OTHER): Payer: PPO | Admitting: Neurology

## 2020-02-11 ENCOUNTER — Other Ambulatory Visit: Payer: Self-pay

## 2020-02-11 DIAGNOSIS — C50211 Malignant neoplasm of upper-inner quadrant of right female breast: Secondary | ICD-10-CM | POA: Diagnosis not present

## 2020-02-11 DIAGNOSIS — G4733 Obstructive sleep apnea (adult) (pediatric): Secondary | ICD-10-CM

## 2020-02-11 DIAGNOSIS — Z170 Estrogen receptor positive status [ER+]: Secondary | ICD-10-CM | POA: Diagnosis not present

## 2020-02-11 DIAGNOSIS — I208 Other forms of angina pectoris: Secondary | ICD-10-CM

## 2020-02-11 DIAGNOSIS — R0683 Snoring: Secondary | ICD-10-CM

## 2020-02-11 DIAGNOSIS — I10 Essential (primary) hypertension: Secondary | ICD-10-CM

## 2020-02-18 DIAGNOSIS — C50911 Malignant neoplasm of unspecified site of right female breast: Secondary | ICD-10-CM | POA: Diagnosis not present

## 2020-02-25 DIAGNOSIS — R0683 Snoring: Secondary | ICD-10-CM | POA: Insufficient documentation

## 2020-02-25 DIAGNOSIS — I10 Essential (primary) hypertension: Secondary | ICD-10-CM | POA: Insufficient documentation

## 2020-02-25 DIAGNOSIS — I208 Other forms of angina pectoris: Secondary | ICD-10-CM | POA: Insufficient documentation

## 2020-02-25 NOTE — Addendum Note (Signed)
Addended by: Larey Seat on: 02/25/2020 06:02 PM   Modules accepted: Orders

## 2020-02-25 NOTE — Procedures (Signed)
  Patient Information     First Name: Kelly Last Name: Blackwell ID: [id number]  Birth Date: 2041/07/07 Age: 79 Gender: Female  Referring Provider: Leanna Battles, Md BMI: 22.3 (W=121 lb, H=5' 2'')  Neck Circ.:  14 '' Epworth:  4/24   Sleep Study Information    Study Date: 02/11/20 S/H/A Version: 001.001.001.001 / 4.1.1528 / 77  History:    [name] is a right -handed Caucasian female with a past medical history of Allergy, Anxiety, Arthritis, Breast cancer ([place]), Hypercholesteremia, Hypertension, Osteopenia, and Personal history of radiation therapy. she underwent lumpectomy January 2020 and radiation in March 2020. The patient was seen on 12-24-2019 upon referral by PCP.  Patient is snoring and has unexplained elevated BP.       Summary & Diagnosis:    Moderate sleep apnea at AHI of 22.7/h was noted, with some accentuation in REM sleep. REM AHI was 27.2/h. there is indication of loud snoring, based on high RDI score. No significant positional component, no hypoxemia.  Blood pressure may be affected by his high Respiratory Disturbance Index.    Recommendations:     I suggest starting therapy with an autotitration capable CPAP device and heated humidification. The settings shall be 6-16 cm water with 2 cm EPR and a mask of patient's choice, to be fitted in reclined position.   Interpreting Physician: Larey Seat, MD              Sleep Summary    Oxygen Saturation Statistics     Start Study Time: End Study Time: Total Recording Time:  9:43:03 PM 5:58:11 AM 8 h, 15 min  Total Sleep Time % REM of Sleep Time:  7 h, 32 min  15.2    Mean: 94 Minimum: 91 Maximum: 99  Mean of Desaturations Nadirs (%):   93  Oxygen Desaturation. %: 4-9 10-20 >20 Total  Events Number Total  19 100.0  0 0.0  0 0.0  19 100.0  Oxygen Saturation: <90 <=88 <85 <80 <70  Duration (minutes): Sleep % 0.0 0.0 0.0 0.0 0.0 0.0 0.0 0.0 0.0 0.0     Respiratory Indices      Total Events  REM NREM All Night  pRDI:  108  pAHI:  69 ODI:  19  pAHIc:  4  % CSR: 0.0 42.7 27.2 15.5 3.9 33.7 21.6 5.2 1.0 34.5 22.0 6.1 1.3       Pulse Rate Statistics during Sleep (BPM)      Mean: 60 Minimum: 38 Maximum: 89    Indices are calculated using technically valid sleep time of 3 h, 7 min. pRDI/pAHI are calculated using oxi desaturations ? 3%  Body Position Statistics  Position Supine Prone Right Left Non-Supine  Sleep (min) 136.5 0.0 120.1 196.0 316.1  Sleep % 30.2 0.0 26.5 43.3 69.8  pRDI 38.2 N/A 40.5 25.8 31.0  pAHI 24.4 N/A 15.9 22.0 19.8  ODI 7.3 N/A 3.5 5.7 5.0     Snoring Statistics Snoring Level (dB) >40 >50 >60 >70 >80 >Threshold (45)  Sleep (min) 129.0 2.2 0.4 0.0 0.0 4.9  Sleep % 28.5 0.5 0.1 0.0 0.0 1.1    Mean: 41 dB

## 2020-02-25 NOTE — Progress Notes (Signed)
Summary & Diagnosis:   Moderate sleep apnea at AHI of 22.7/h was noted, with some  accentuation in REM sleep. REM AHI was 27.2/h. there is  indication of loud snoring, based on high RDI score. No  significant positional component, no hypoxemia.  Blood pressure may be affected by his high Respiratory  Disturbance Index.    Recommendations:    I suggest starting therapy with an autotitration capable CPAP  device and heated humidification. The settings shall be 6-16 cm  water with 2 cm EPR and a mask of patient's choice, to be fitted  in reclined position.   Interpreting Physician: Larey Seat, MD

## 2020-02-26 ENCOUNTER — Telehealth: Payer: Self-pay | Admitting: Neurology

## 2020-02-26 NOTE — Telephone Encounter (Signed)
Called patient to discuss sleep study results. No answer at this time. LVM for the patient to call back.   

## 2020-02-26 NOTE — Telephone Encounter (Signed)
-----   Message from Larey Seat, MD sent at 02/25/2020  6:02 PM EDT ----- Summary & Diagnosis:   Moderate sleep apnea at AHI of 22.7/h was noted, with some  accentuation in REM sleep. REM AHI was 27.2/h. there is  indication of loud snoring, based on high RDI score. No  significant positional component, no hypoxemia.  Blood pressure may be affected by his high Respiratory  Disturbance Index.    Recommendations:    I suggest starting therapy with an autotitration capable CPAP  device and heated humidification. The settings shall be 6-16 cm  water with 2 cm EPR and a mask of patient's choice, to be fitted  in reclined position.   Interpreting Physician: Larey Seat, MD

## 2020-02-27 NOTE — Telephone Encounter (Signed)
I called pt. I advised pt that Dr. Brett Fairy reviewed their sleep study results and found that pt moderate sleep apnea. Dr. Brett Fairy recommends that pt starts auto CPAP. I reviewed PAP compliance expectations with the pt. Pt is agreeable to starting a CPAP. I advised pt that an order will be sent to a DME, [facility] and [facility] ([place] will call the pt within about one week after they file with the pt's insurance. [facility] [employer]) will show the pt how to use the machine, fit for masks, and troubleshoot the CPAP if needed. A follow up appt was made for insurance purposes with [name], NP on Oct 28,2021 at 11 am. Pt verbalized understanding to arrive 15 minutes early and bring their CPAP. A letter with all of this information in it will be mailed to the pt as a reminder. I verified with the pt that the address we have on file is correct. Pt verbalized understanding of results. Pt had no questions at this time but was encouraged to call back if questions arise. I have sent the order to [facility]) and have received confirmation that they have received the order.

## 2020-03-20 DIAGNOSIS — G4733 Obstructive sleep apnea (adult) (pediatric): Secondary | ICD-10-CM | POA: Diagnosis not present

## 2020-03-20 DIAGNOSIS — I10 Essential (primary) hypertension: Secondary | ICD-10-CM | POA: Diagnosis not present

## 2020-03-20 DIAGNOSIS — G4762 Sleep related leg cramps: Secondary | ICD-10-CM | POA: Diagnosis not present

## 2020-04-01 DIAGNOSIS — G4733 Obstructive sleep apnea (adult) (pediatric): Secondary | ICD-10-CM | POA: Diagnosis not present

## 2020-04-20 IMAGING — US US BREAST BX W LOC DEV 1ST LESION IMG BX SPEC US GUIDE*R*
1 series · 13 of 13 positions shown · non-contrast
Comparison: Previous exam(s).

Addendum:
CLINICAL DATA: Right breast 1 o'clock mass.

EXAM:
ULTRASOUND GUIDED RIGHT BREAST CORE NEEDLE BIOPSY
Scratch

[Series 1: us breast bx w loc dev 1st lesion img bx spec us g · 0.06mm/px · 13 of 13 slices shown]
[im 1/13]
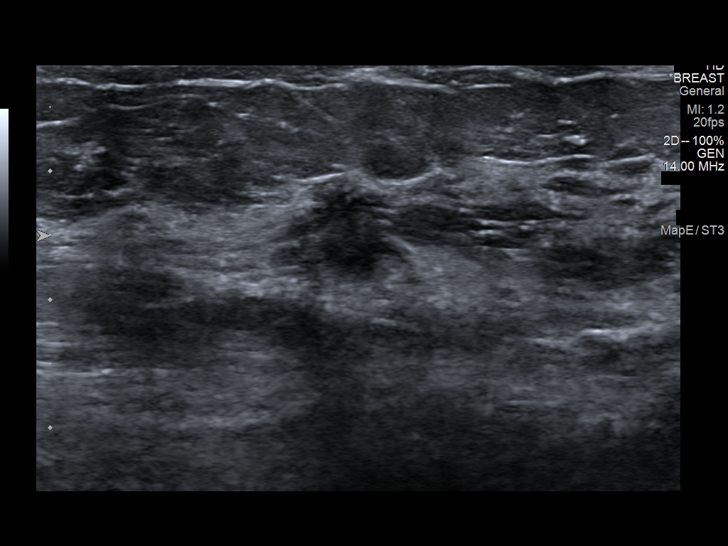
[im 2/13]
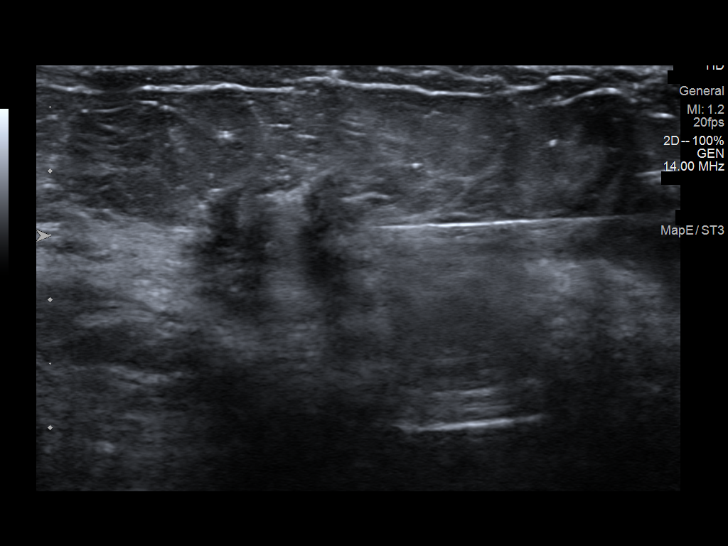
[im 3/13]
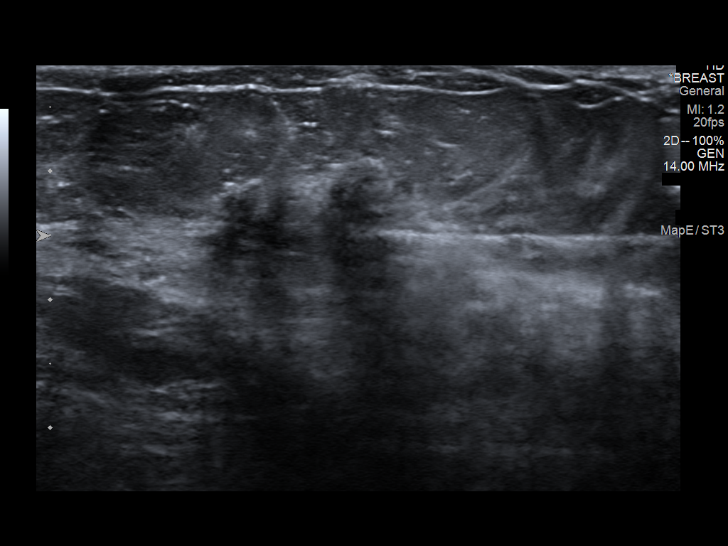
[im 4/13]
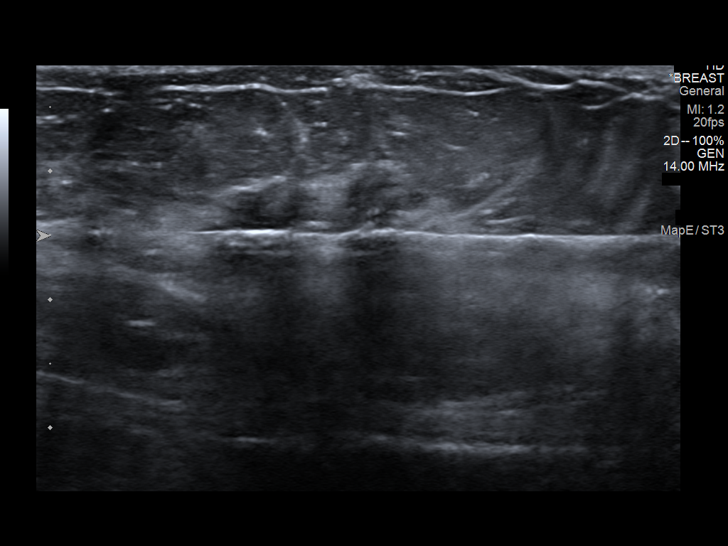
[im 5/13]
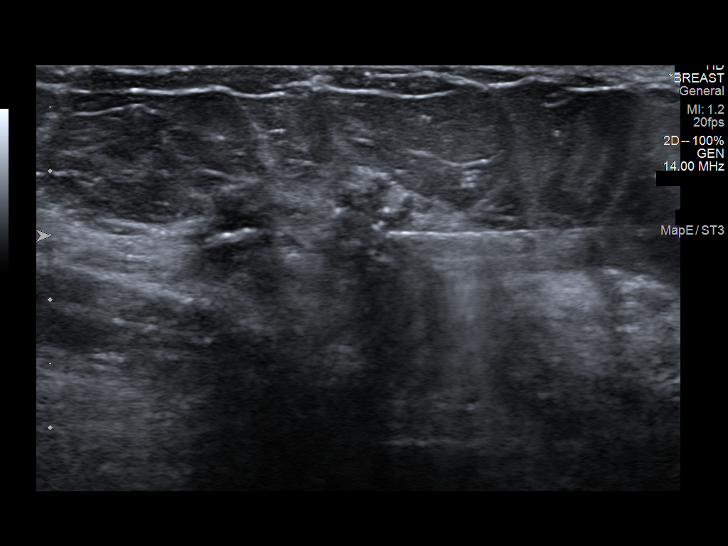
[im 6/13]
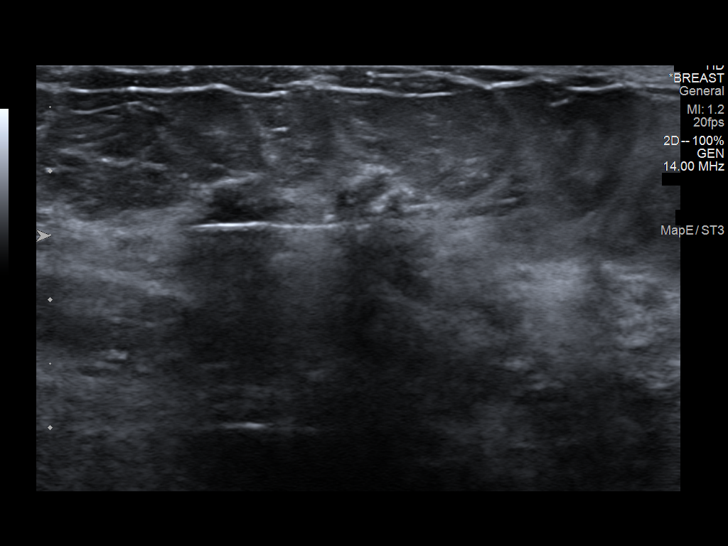
[im 7/13]
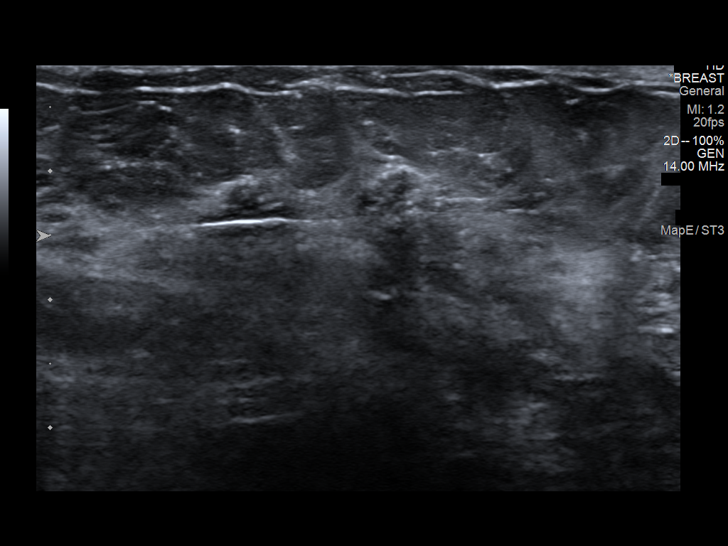
[im 8/13]
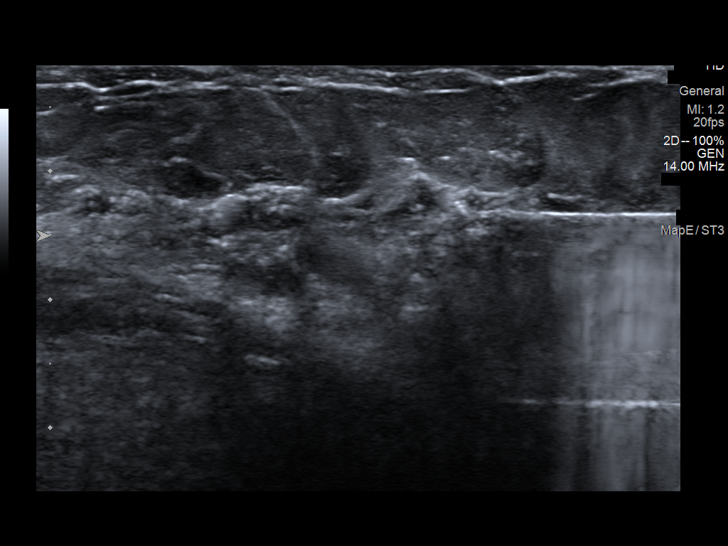
[im 9/13]
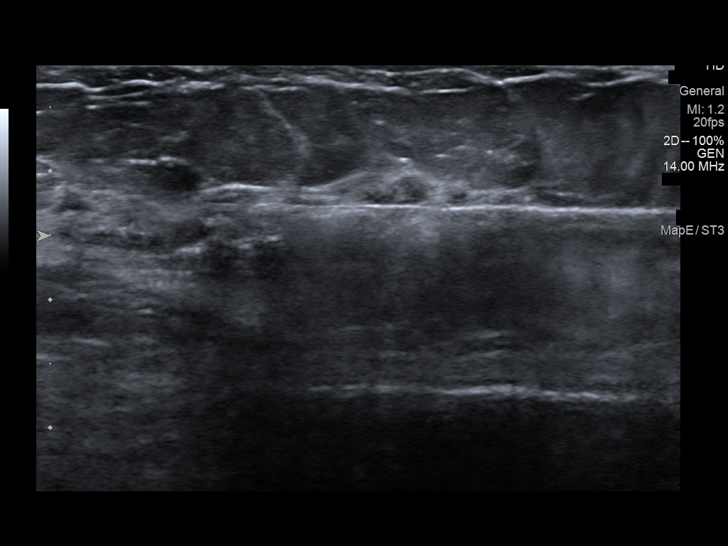
[im 10/13]
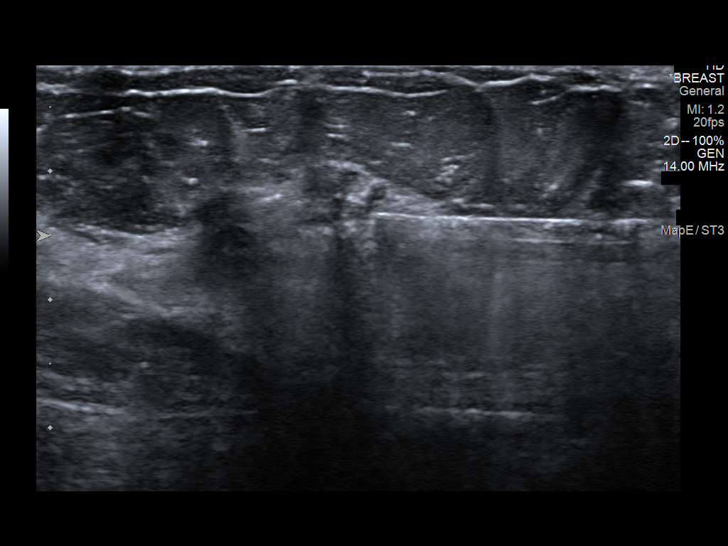
[im 11/13]
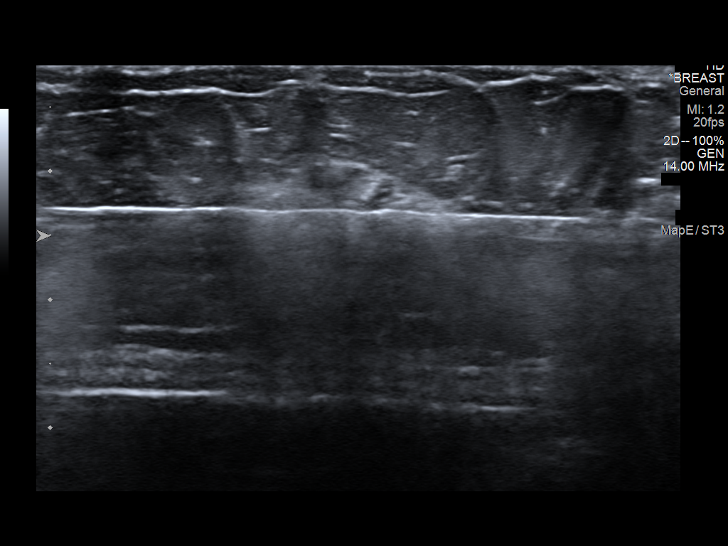
[im 12/13]
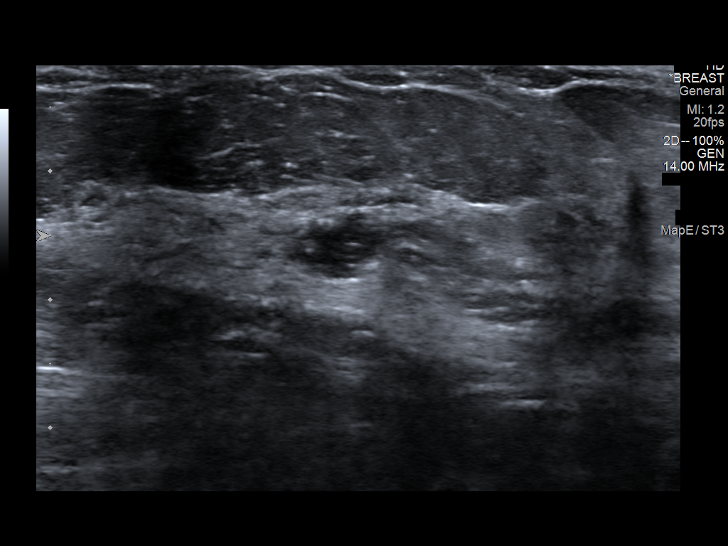
[im 13/13]
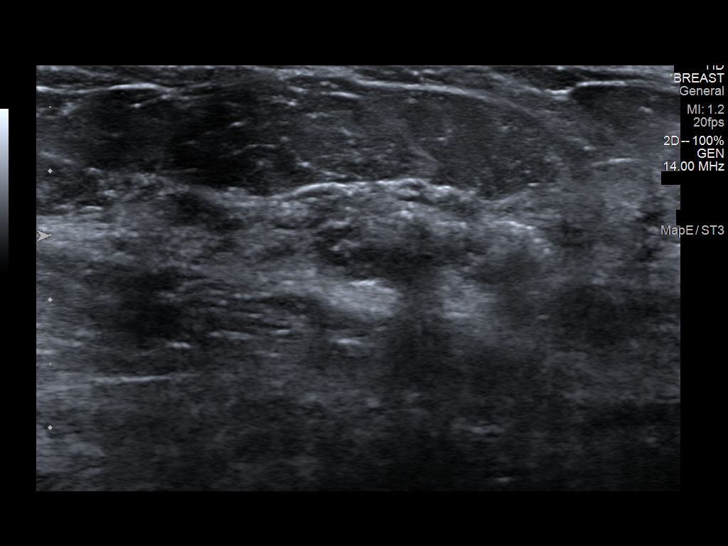

[13 of 13 positions shown; findings below may reference images not displayed]



Lesion quadrant: Upper inner quadrant

Using sterile technique and 1% Lidocaine as local anesthetic, under
direct ultrasound visualization, a 14 gauge Johanne device was
used to perform biopsy of right breast 1 o'clock mass using a
inferior approach. At the conclusion of the procedure a ribbon
shaped tissue marker clip was deployed into the biopsy cavity.
Follow up 2 view mammogram was performed and dictated separately.
IMPRESSION: Ultrasound guided biopsy of right breast. No apparent complications.

ADDENDUM:
Pathology revealed GRADE I-II INVASIVE MAMMARY CARCINOMA of the
Right breast, 1 o'clock. This was found to be concordant by Dr.
Jun Schell.

Pathology results were discussed with the patient by telephone. The
patient reported doing well after the biopsy with tenderness at the
site. Post biopsy instructions and care were reviewed and questions
were answered. The patient was encouraged to call The [REDACTED]

Surgical consultation has been arranged with Dr. Thuylinh Senk at
[REDACTED] on July 24, 2018.

Pathology results reported by Bambucafe Tarla, RN on 07/18/2018.

*** End of Addendum ***

## 2020-05-01 DIAGNOSIS — G4733 Obstructive sleep apnea (adult) (pediatric): Secondary | ICD-10-CM | POA: Diagnosis not present

## 2020-05-14 ENCOUNTER — Telehealth: Payer: Self-pay | Admitting: Adult Health

## 2020-05-14 NOTE — Telephone Encounter (Signed)
Patient is scheduled for an initial CPAP follow up tomorrow with Jinny Blossom. I checked [facility] to see if she had a cpap machine, did not see any information. Called patient. She stated that she received her machine on 9/14 and has been using it. I asked if she knew if she had a clip in the machine, she stated that she they did not show her anything about the machine. She will bring the machine tomorrow to her appt so we can get a compliance download.

## 2020-05-15 ENCOUNTER — Other Ambulatory Visit: Payer: Self-pay

## 2020-05-15 ENCOUNTER — Ambulatory Visit: Payer: PPO | Admitting: Adult Health

## 2020-05-15 ENCOUNTER — Encounter: Payer: Self-pay | Admitting: Adult Health

## 2020-05-15 VITALS — BP 116/72 | HR 70 | Ht 61.5 in | Wt 122.2 lb

## 2020-05-15 DIAGNOSIS — G4733 Obstructive sleep apnea (adult) (pediatric): Secondary | ICD-10-CM | POA: Diagnosis not present

## 2020-05-15 DIAGNOSIS — Z9989 Dependence on other enabling machines and devices: Secondary | ICD-10-CM

## 2020-05-15 NOTE — Patient Instructions (Addendum)
Continue using CPAP nightly and greater than 4 hours each night Mask refitting ordered If your symptoms worsen or you develop new symptoms please let us know.   

## 2020-05-15 NOTE — Progress Notes (Signed)
PATIENT: Kelly Blackwell DOB: Jan 09, 1941  REASON FOR VISIT: follow up Kelly FROM: patient  Kelly OF PRESENT ILLNESS: Today 05/15/20:  Kelly Blackwell is a 79 year old female with a Kelly of obstructive sleep apnea on CPAP.  Her download indicates that she use her machine nightly for compliance of 100%.  She use her machine greater than 4 hours each night.  On average she uses her machine 6 hours and 25 minutes.  Her residual AHI is 4.7 on 6 to 16 cm of water.  She states that she would like to try different facemask.  She does not find the face mask that she has very comfortable-she feels as if it is too big for her face.  She returns today for an evaluation.  Kelly Blackwell a 79 y.o.femalewho is being seen today for the evaluation of chest painat the request of Leanna Battles, MD. Patient's past medical Kelly and cardiac risk factors include:Hypertension, hyperlipidemia, Kelly of breast cancer, postmenopausal female, recent BP in the 180s.   Patient states that she has been having chest discomfort that has been going on for the last several months. Initially she had similar discomfort which was attributed to heartburn and was started on omeprazole and did well for couple years. However recently they have resurfaced. The pain is located substernally, radiates to bilateral shoulders, 7 out of 10, last for less than 5 minutes, occurs at least once a month, improves with rest. About 1 month ago patient states that she had chest pain with effort related activity. She parked her car and went to the grocery store by the time she got into the store she was having substernal chest pain which resolved with rest.  In the past patient has had a treadmill stress test which was reported to be equivocal and was referred for nuclear stress test because of her symptoms improved this never took place.  No Kelly of premature coronary artery disease in the family or sudden cardiac  death.  Denies prior Kelly of coronary artery disease, myocardial infarction, congestive heart failure, deep venous thrombosis, pulmonary embolism, stroke, transient ischemic attack.   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  and lives in a household with 1 person/ husband has spinal stenosis, has obesity and OSA- . Family status is married , with 3 adult sons. 6 grandchildren.  The patient used to work in shifts( Presenter, broadcasting,) Pets are not present. Tobacco use: not.  ETOH use : 4/ a month, Caffeine intake in form of Coffee( in AM ) Soda( rare ) Tea ( seldomly) , nor energy drinks. Regular exercise in form of walking.     Sleep habits are as follows:The patient's dinner time is between 5-6 PM. The patient goes to bed at 9.30-10 PM and continues to sleep for 7-8 hours, wakes for 1 bathroom break.    The preferred sleep position is lateral, with the support of a neck pillow . Dreams are reportedly frequent.  6  AM is the usual rise time. The patient wakes up spontaneously at 5.30. she wears a mouth guard for Bruxism protection.  She reports mostly  feeling refreshed and  restored in AM, with symptoms such as dry mouth and some residual fatigue.  Naps are taken infrequently, lasting from 20 to 30 minutes.  REVIEW OF SYSTEMS: Out of a complete 14 system review of symptoms, the patient complains only of the following symptoms, and all other reviewed systems  are negative.  ESS 3  ALLERGIES: Allergies  Allergen Reactions  . Beef Allergy Diarrhea and Nausea And Vomiting  . Sulfa Antibiotics     Fever and rash     HOME MEDICATIONS: Outpatient Medications Prior to Visit  Medication Sig Dispense Refill  . ALPRAZolam (XANAX) 0.25 MG tablet Take 0.25 mg by mouth 2 (two) times daily as needed for anxiety.    Marland Kitchen anastrozole (ARIMIDEX) 1 MG tablet Take 1 tablet (1 mg total) by mouth daily. 90 tablet 3  . atorvastatin (LIPITOR)  10 MG tablet Take 10 mg by mouth daily.     . Calcium Carb-Cholecalciferol (CALCIUM 600 + D PO) Take 1 tablet by mouth 2 (two) times daily.    . cetirizine (ZYRTEC) 10 MG tablet Take 10 mg by mouth daily.    . cholecalciferol (VITAMIN D3) 25 MCG (1000 UT) tablet Take 1,000 Units by mouth daily.    Marland Kitchen EPINEPHrine 0.3 mg/0.3 mL IJ SOAJ injection INJECT INTO OUTER THIGH AS NEEDED FOR SYSTEMIC REACTIONS    . nitroGLYCERIN (NITROSTAT) 0.4 MG SL tablet DISSOLVE 1 TABLET UNDER THE TONGUE EVERY 5 MINUTES AS NEEDED FOR CHEST PAIN. IF YOU REQUIRE MORE THAN TWO TABLETS FIVE MINUTES APART GO TO THE NEAREST ER VIA EMS. 100 tablet 1  . omeprazole (PRILOSEC) 20 MG capsule Take 20 mg by mouth daily.    Marland Kitchen telmisartan-hydrochlorothiazide (MICARDIS HCT) 80-12.5 MG tablet Take 1 tablet by mouth daily.    Marland Kitchen amLODipine (NORVASC) 5 MG tablet Take 5 mg by mouth daily.    Marland Kitchen aspirin EC 81 MG tablet Take 81 mg by mouth daily.    Marland Kitchen co-enzyme Q-10 30 MG capsule Take 100 mg by mouth daily.     No facility-administered medications prior to visit.    PAST MEDICAL Kelly: Past Medical Kelly:  Diagnosis Date  . Allergy    seasonal allergies.   . Anxiety   . Arthritis    Neck  . Breast cancer ([place])   . Hypercholesteremia   . Hypertension   . Osteopenia   . Personal Kelly of radiation therapy     PAST SURGICAL Kelly: Past Surgical Kelly:  Procedure Laterality Date  . APPENDECTOMY     age 24  . BREAST LUMPECTOMY Right 08/03/2018  . BREAST LUMPECTOMY WITH RADIOACTIVE SEED AND SENTINEL LYMPH NODE BIOPSY Right 08/03/2018   Procedure: RIGHT BREAST LUMPECTOMY WITH RADIOACTIVE SEED AND SENTINEL LYMPH NODE BIOPSY WITH BLUE DYE INJECTION;  Surgeon: Donnie Mesa, MD;  Location: [facility];  Service: General;  Laterality: Right;  . bunionectmy     1986  . CHOLECYSTECTOMY     age 23  . TONSILLECTOMY      FAMILY Kelly: Family Kelly  Problem Relation Age of Onset  . Hypertension Mother   . Osteoporosis Mother    . Atrial fibrillation Mother   . Heart failure Mother   . Colon cancer Father   . Coronary artery disease Father   . AAA (abdominal aortic aneurysm) Father     SOCIAL Kelly: Social Kelly   Socioeconomic Kelly  . Marital status: Married    Spouse name: Not on file  . Number of children: 3  . Years of education: Not on file  . Highest education level: Not on file  Occupational Kelly  . Not on file  Tobacco Use  . Smoking status: Never Smoker  . Smokeless tobacco: Never Used  Vaping Use  . Vaping Use: Never used  Substance and Sexual Activity  .  Alcohol use: Not Currently    Comment: occasionally, a couple of times a month.   . Drug use: Never  . Sexual activity: Not on file  Other Topics Concern  . Not on file  Social Kelly Narrative  . Not on file   Social Determinants of Health   Financial Resource Strain:   . Difficulty of Paying Living Expenses: Not on file  Food Insecurity:   . Worried About Charity fundraiser in the Last Year: Not on file  . Ran Out of Food in the Last Year: Not on file  Transportation Needs:   . Lack of Transportation (Medical): Not on file  . Lack of Transportation (Non-Medical): Not on file  Physical Activity:   . Days of Exercise per Week: Not on file  . Minutes of Exercise per Session: Not on file  Stress:   . Feeling of Stress : Not on file  Social Connections:   . Frequency of Communication with Friends and Family: Not on file  . Frequency of Social Gatherings with Friends and Family: Not on file  . Attends Religious Services: Not on file  . Active Member of Clubs or Organizations: Not on file  . Attends Archivist Meetings: Not on file  . Marital Status: Not on file  Intimate Partner Violence:   . Fear of Current or Ex-Partner: Not on file  . Emotionally Abused: Not on file  . Physically Abused: Not on file  . Sexually Abused: Not on file      PHYSICAL EXAM  Vitals:   05/15/20 1048  BP: 116/72    Pulse: 70  Weight: 122 lb 3.2 oz (55.4 kg)  Height: 5' 1.5" (1.562 m)   Body mass index is 22.72 kg/m.  Generalized: Well developed, in no acute distress  Chest: Lungs clear to auscultation bilaterally  Neurological examination  Mentation: Alert oriented to time, place, Kelly taking. Follows all commands speech and language fluent Cranial nerve II-XII: Extraocular movements were full, visual field were full on confrontational test Head turning and shoulder shrug  were normal and symmetric. Motor: The motor testing reveals 5 over 5 strength of all 4 extremities. Good symmetric motor tone is noted throughout.  Sensory: Sensory testing is intact to soft touch on all 4 extremities. No evidence of extinction is noted.  Gait and station: Gait is normal.    DIAGNOSTIC DATA (LABS, IMAGING, TESTING) - I reviewed patient records, labs, notes, testing and imaging myself where available.  Lab Results  Component Value Date   WBC 6.5 07/31/2018   HGB 13.6 07/31/2018   HCT 41.4 07/31/2018   MCV 89.8 07/31/2018   PLT 268 07/31/2018      Component Value Date/Time   NA 134 (L) 07/31/2018 1138   K 3.6 07/31/2018 1138   CL 100 07/31/2018 1138   CO2 27 07/31/2018 1138   GLUCOSE 98 07/31/2018 1138   BUN 15 07/31/2018 1138   CREATININE 0.83 07/31/2018 1138   CALCIUM 9.6 07/31/2018 1138   GFRNONAA >60 07/31/2018 1138   GFRAA >60 07/31/2018 1138      ASSESSMENT AND PLAN 79 y.o. year old female  has a past medical Kelly of Allergy, Anxiety, Arthritis, Breast cancer ([place]), Hypercholesteremia, Hypertension, Osteopenia, and Personal Kelly of radiation therapy. here with:  1. OSA on CPAP  - CPAP compliance excellent - Good treatment of AHI  - Encourage patient to use CPAP nightly and > 4 hours each night - F/U in 1 year or  sooner if needed   I spent 25 minutes of face-to-face and non-face-to-face time with patient.  This included previsit chart review, lab review, study review,  order entry, electronic health record documentation, patient education.  [name], MSN, NP-C 05/15/2020, 11:23 AM [facility] [address] [place], [place] [address] [phone]

## 2020-05-20 ENCOUNTER — Telehealth: Payer: Self-pay | Admitting: Adult Health

## 2020-05-20 NOTE — Telephone Encounter (Signed)
Pt called, notifying NP have not received a call from [place]. I was instructed by NP to let her know if have not received a call concerning fit for new mask.

## 2020-05-20 NOTE — Telephone Encounter (Signed)
Messaged [facility] to let them know not received call.

## 2020-05-21 NOTE — Telephone Encounter (Signed)
RE: cpap orders Received: [name], Wilmer Floor, RN Will reach out to pt.   Kelly Blackwell

## 2020-05-22 ENCOUNTER — Other Ambulatory Visit: Payer: Self-pay | Admitting: Hematology and Oncology

## 2020-05-22 DIAGNOSIS — Z1231 Encounter for screening mammogram for malignant neoplasm of breast: Secondary | ICD-10-CM

## 2020-05-28 ENCOUNTER — Telehealth: Payer: Self-pay | Admitting: *Deleted

## 2020-05-28 ENCOUNTER — Telehealth: Payer: Self-pay | Admitting: Hematology and Oncology

## 2020-05-28 NOTE — Telephone Encounter (Signed)
Pt called c/o redness, itchy, and hot to touch at right breast that has reoccurred. Pt requested to be seen and request appt for booster. Message was routed to [facility] and scheduling.

## 2020-05-28 NOTE — Telephone Encounter (Signed)
Called pt per 11/10 sch msg - unable to reach pt . Left message for patient to call back to set up appt.

## 2020-05-29 ENCOUNTER — Ambulatory Visit: Payer: PPO | Admitting: Cardiology

## 2020-05-29 ENCOUNTER — Telehealth: Payer: Self-pay

## 2020-05-29 ENCOUNTER — Inpatient Hospital Stay: Payer: PPO | Attending: Adult Health | Admitting: Adult Health

## 2020-05-29 ENCOUNTER — Ambulatory Visit: Payer: PPO | Admitting: Adult Health

## 2020-05-29 ENCOUNTER — Encounter: Payer: Self-pay | Admitting: Cardiology

## 2020-05-29 ENCOUNTER — Other Ambulatory Visit: Payer: Self-pay

## 2020-05-29 VITALS — BP 145/83 | HR 77 | Temp 98.0°F | Resp 18 | Ht 61.5 in | Wt 123.9 lb

## 2020-05-29 VITALS — BP 133/77 | HR 78 | Ht 61.5 in | Wt 124.0 lb

## 2020-05-29 DIAGNOSIS — I10 Essential (primary) hypertension: Secondary | ICD-10-CM

## 2020-05-29 DIAGNOSIS — Z170 Estrogen receptor positive status [ER+]: Secondary | ICD-10-CM | POA: Diagnosis not present

## 2020-05-29 DIAGNOSIS — G4733 Obstructive sleep apnea (adult) (pediatric): Secondary | ICD-10-CM | POA: Diagnosis not present

## 2020-05-29 DIAGNOSIS — Z7901 Long term (current) use of anticoagulants: Secondary | ICD-10-CM

## 2020-05-29 DIAGNOSIS — Z79811 Long term (current) use of aromatase inhibitors: Secondary | ICD-10-CM | POA: Insufficient documentation

## 2020-05-29 DIAGNOSIS — Z9989 Dependence on other enabling machines and devices: Secondary | ICD-10-CM | POA: Diagnosis not present

## 2020-05-29 DIAGNOSIS — E782 Mixed hyperlipidemia: Secondary | ICD-10-CM

## 2020-05-29 DIAGNOSIS — C50211 Malignant neoplasm of upper-inner quadrant of right female breast: Secondary | ICD-10-CM | POA: Insufficient documentation

## 2020-05-29 DIAGNOSIS — I4891 Unspecified atrial fibrillation: Secondary | ICD-10-CM | POA: Diagnosis not present

## 2020-05-29 DIAGNOSIS — M8580 Other specified disorders of bone density and structure, unspecified site: Secondary | ICD-10-CM | POA: Diagnosis not present

## 2020-05-29 MED ORDER — METOPROLOL SUCCINATE ER 25 MG PO TB24: 25.0000 mg | ORAL_TABLET | Freq: Every day | ORAL | 1 refills | Status: DC

## 2020-05-29 MED ORDER — APIXABAN 5 MG PO TABS: 5.0000 mg | ORAL_TABLET | Freq: Two times a day (BID) | ORAL | 1 refills | Status: DC

## 2020-05-29 NOTE — Telephone Encounter (Signed)
Called and left appt details for mammogram/ultrasound on 12/10 at 2 pm. Arrive at 1:40 pm to the breast center. Ask her to call the office back for questions. Left the phone # for the breast center and ask her to call them to see if they have cancellations for a earlier appt.

## 2020-05-29 NOTE — Assessment & Plan Note (Addendum)
07/17/2018:Screening detected right breast mass at 1 o'clock position 1.1 cm, no axillary lymph nodes, biopsy revealed IDC grade 1-2, ER 100%, PR 80%, Ki-67 5%, HER-2 negative, 2+ by IHC and ratio 1.22 by FISH, T1CN0 stage Ia  08/03/2018:Right lumpectomy: IDC 0.6 cm, grade 2, margins negative, 0/4 lymph nodes negative, ER 100%, PR 80%, HER-2 equivocal by IHC, FISH negative ratio 1.95, Ki-67 5%, T1BN0 stage Ia  Bone density 08/29/2018:T score -2: Osteopenia  Treatment plan: Adjuvant antiestrogen therapy with anastrozole 1 mg daily. Anastrozole toxicities: none  Due to her breast dimpling, she needs a mammogram and ultrasound.  I have placed orders for this.  I sent the breast navigators at [facility] a message requesting an expedited appointment considering her upcoming booster.    We will see her back as scheduled in January, 2022 for f/u with Dr. Lindi Adie for routine surveillance and monitoring.  She knows to call for any questions that may arise between now and her next appointment.  We are happy to see her sooner if needed.

## 2020-05-29 NOTE — Progress Notes (Signed)
Kelly Blackwell Date of Birth: 1940-10-25 MRN: [id number] Cardiologist:  [facility] (established care 11/15/2019) Former Cardiology Providers: Dr. Adrian Prows  Date: 05/29/20 Last Office Visit: 12/13/2019  Chief Complaint  Patient presents with  . Follow-up    6 month follow up re-evalution of chest pain.    HPI  Kelly Blackwell is a 79 y.o. female who presents to the office with a  chief complaint of " 65-monthfollow-up for evaluation of chest pain."  Her past medical history and cardiovascular risk factors are: Sleep Apnea, atrial fibrillation, hypertension, hyperlipidemia, history of breast cancer, postmenopausal female, advanced age.   Patient was originally referred to the office for evaluation of chest pain at the request of her primary care provider back in April 2021.   Patient is here for 672-monthollow-up to reevaluate chest pain.  Since last office visit patient states that she has had 1 episode of chest pain suggestive of atypical etiology.  Otherwise she is stable from a cardiovascular standpoint.  She had a routine EKG performed as a part of today's visit which notes the underlying rhythm to be atrial fibrillation.  Patient denies any prior history of atrial fibrillation or flutter.  No prior history of intracranial bleeding or gastrointestinal bleeding.  Patient recently also had a sleep study was diagnosed with moderate sleep apnea and is currently on CPAP.  Patient denies any symptoms of palpitations.  She is very familiar with the pathophysiology and management of atrial fibrillation and was her mom and husband both have it.  She recently had an ischemic evaluation including an echo and stress test as noted below.  No history of premature coronary artery disease in the family or sudden cardiac death.  FUNCTIONAL STATUS: She walks 5 days a week for 30-45 minutes.    ALLERGIES: Allergies  Allergen Reactions  . Beef Allergy Diarrhea and Nausea And Vomiting  .  Sulfa Antibiotics Other (See Comments)    Fever and rash     MEDICATION LIST PRIOR TO VISIT: Current Meds  Medication Sig  . ALPRAZolam (XANAX) 0.25 MG tablet Take 0.25 mg by mouth 2 (two) times daily as needed for anxiety.  . Marland Kitchennastrozole (ARIMIDEX) 1 MG tablet Take 1 tablet (1 mg total) by mouth daily.  . Marland Kitchentorvastatin (LIPITOR) 10 MG tablet Take 10 mg by mouth daily.   . Calcium Carb-Cholecalciferol (CALCIUM 600 + D PO) Take 1 tablet by mouth 2 (two) times daily.  . cetirizine (ZYRTEC) 10 MG tablet Take 10 mg by mouth daily.  . cholecalciferol (VITAMIN D3) 25 MCG (1000 UT) tablet Take 1,000 Units by mouth daily.  . Marland KitchenPINEPHrine 0.3 mg/0.3 mL IJ SOAJ injection INJECT INTO OUTER THIGH AS NEEDED FOR SYSTEMIC REACTIONS  . methocarbamol (ROBAXIN) 500 MG tablet Take 500 mg by mouth at bedtime.  . nitroGLYCERIN (NITROSTAT) 0.4 MG SL tablet DISSOLVE 1 TABLET UNDER THE TONGUE EVERY 5 MINUTES AS NEEDED FOR CHEST PAIN. IF YOU REQUIRE MORE THAN TWO TABLETS FIVE MINUTES APART GO TO THE NEAREST ER VIA EMS.  . Marland Kitchenmeprazole (PRILOSEC) 20 MG capsule Take 20 mg by mouth daily.  . Marland Kitchenelmisartan-hydrochlorothiazide (MICARDIS HCT) 80-12.5 MG tablet Take 1 tablet by mouth daily.     PAST MEDICAL HISTORY: Past Medical History:  Diagnosis Date  . Allergy    seasonal allergies.   . Anxiety   . Arthritis    Neck  . [facility] ([facility]  . Hypercholesteremia   . Hypertension   . OSA  on CPAP 12/2019  . Osteopenia   . Personal history of radiation therapy     PAST SURGICAL HISTORY: Past Surgical History:  Procedure Laterality Date  . APPENDECTOMY     age 70  . BREAST LUMPECTOMY Right 08/03/2018  . BREAST LUMPECTOMY WITH RADIOACTIVE SEED AND SENTINEL LYMPH NODE BIOPSY Right 08/03/2018   Procedure: RIGHT BREAST LUMPECTOMY WITH RADIOACTIVE SEED AND SENTINEL LYMPH NODE BIOPSY WITH BLUE DYE INJECTION;  Surgeon: Donnie Mesa, MD;  Location: [facility];  Service: General;  Laterality: Right;  . bunionectmy     1986   . CHOLECYSTECTOMY     age 40  . TONSILLECTOMY      FAMILY HISTORY: The patient family history includes AAA (abdominal aortic aneurysm) in her father; Atrial fibrillation in her mother; Colon cancer in her father; Coronary artery disease in her father; Heart failure in her mother; Hypertension in her mother; Osteoporosis in her mother.  SOCIAL HISTORY:  The patient  reports that she has never smoked. She has never used smokeless tobacco. She reports previous alcohol use. She reports that she does not use drugs.  REVIEW OF SYSTEMS: Review of Systems  Constitutional: Negative for chills and fever.  HENT: Negative for hoarse voice and nosebleeds.   Eyes: Negative for discharge, double vision and pain.  Cardiovascular: Negative for chest pain, claudication, dyspnea on exertion, leg swelling, near-syncope, orthopnea, palpitations, paroxysmal nocturnal dyspnea and syncope.  Respiratory: Negative for hemoptysis and shortness of breath.   Musculoskeletal: Negative for muscle cramps and myalgias.  Gastrointestinal: Negative for abdominal pain, constipation, diarrhea, hematemesis, hematochezia, melena, nausea and vomiting.  Neurological: Negative for dizziness and light-headedness.   PHYSICAL EXAM: Vitals with BMI 05/29/2020 05/29/2020 05/15/2020  Height 5' 1.5" 5' 1.5" 5' 1.5"  Weight 123 lbs 14 oz 124 lbs 122 lbs 3 oz  BMI 23.03 50.53 97.67  Systolic 341 937 902  Diastolic 83 77 72  Pulse 77 78 70   CONSTITUTIONAL: Well-developed and well-nourished. No acute distress.  SKIN: Skin is warm and dry. No rash noted. No cyanosis. No pallor. No jaundice HEAD: Normocephalic and atraumatic.  EYES: No scleral icterus MOUTH/THROAT: Moist oral membranes.  NECK: No JVD present. No thyromegaly noted. No carotid bruits  LYMPHATIC: No visible cervical adenopathy.  CHEST Normal respiratory effort. No intercostal retractions  LUNGS: Clear to auscultation bilaterally.  No stridor. No wheezes. No rales.   CARDIOVASCULAR: Regular rate and rhythm, positive S1-S2, no murmurs rubs or gallops appreciated ABDOMINAL: No apparent ascites.  EXTREMITIES: No peripheral edema  HEMATOLOGIC: No significant bruising NEUROLOGIC: Oriented to person, place, and time. Nonfocal. Normal muscle tone.  PSYCHIATRIC: Normal mood and affect. Normal behavior. Cooperative  CARDIAC DATABASE: EKG: 11/15/2019: Normal sinus rhythm, 79 bpm, normal axis, ST depressions in the inferior and lateral leads suggestive of possible ischemia.  Prior EKG dated 07/31/2018 noted normal sinus with PVCs and nonspecific ST abnormality.  05/29/2020: Atrial fibrillation, 79 bpm, normal axis, poor R wave progression, ST-T changes in the inferolateral leads, without underlying injury pattern.  Compared to prior EKG normal sinus rhythm has transition to atrial fibrillation.  Echocardiogram: 11/21/2019: LVEF 60 to 65%, mild LVH, indeterminate diastolic filling pattern, elevated left atrial pressure, mild MR, mild TR.  Stress Testing: Lexiscan (Walking with mod Bruce)Tetrofosmin Stress Test 11/19/2019:  Myocardial perfusion is normal.  Overall LV systolic function is normal without regional wall motion abnormalities.  Stress LV EF: 61%.  No previous exam available for comparison. Low risk study.   Heart Catheterization: None  LABORATORY DATA: CBC Latest Ref Rng & Units 07/31/2018  WBC 4.0 - 10.5 K/uL 6.5  Hemoglobin 12.0 - 15.0 g/dL 13.6  Hematocrit 36 - 46 % 41.4  Platelets 150 - 400 K/uL 268    CMP Latest Ref Rng & Units 07/31/2018  Glucose 70 - 99 mg/dL 98  BUN 8 - 23 mg/dL 15  Creatinine 0.44 - 1.00 mg/dL 0.83  Sodium 135 - 145 mmol/L 134(L)  Potassium 3.5 - 5.1 mmol/L 3.6  Chloride 98 - 111 mmol/L 100  CO2 22 - 32 mmol/L 27  Calcium 8.9 - 10.3 mg/dL 9.6   External Labs: Collected: 10/11/2019 Creatinine 0.8 mg/dL. eGFR: 69 mL/min per 1.73 m Lipid profile: Total cholesterol 169, triglycerides 54, HDL 79, LDL 79, non-HDL  90 Hemoglobin A1c: None  IMPRESSION:    ICD-10-CM   1. Atrial fibrillation (HCC)  I48.91 EKG 12-Lead    metoprolol succinate (TOPROL XL) 25 MG 24 hr tablet    apixaban (ELIQUIS) 5 MG TABS tablet    Basic metabolic panel    Magnesium    Hemoglobin and hematocrit, blood  2. Long term (current) use of anticoagulants  Z79.01   3. OSA on CPAP  G47.33    Z99.89   4. Benign hypertension  I10   5. Mixed hyperlipidemia  E78.2      RECOMMENDATIONS: Kelly Blackwell is a 79 y.o. female whose past medical history and cardiac risk factors include:Sleep Apnea, atrial fibrillation, hypertension, hyperlipidemia, history of breast cancer, postmenopausal female, advanced age.   Atrial fibrillation: Newly diagnosed. . I had a long and detailed discussion with the patient regarding the incidence, etiology, pathophysiology, prognosis, and therapeutic options for atrial fibrillation.  Specifically we discussed oral anticoagulation for stroke prevention. . CHA2DS2-VASc SCORE is 88 (age >82, HTN, female gender) which correlates to 4.0 % risk of stroke per year.  . I reviewed with patient the benefits and risks of initiating/continuing anticoagulation scripts, and based on afib treatment guidelines, I recommended long-term anticoagulation for stroke prevention.  Patient agreed. . Chronicity: Unknown. . Start Toprol-XL 25 mg p.o. daily. . Start Eliquis 5 mg p.o. twice daily. . Check baseline hemoglobin and hematocrit. . Check BMP and magnesium levels. . I will see her back in 3 months if she is not converted to normal sinus rhythm we will discuss cardioversion.  Patient is agreeable with the plan. . Patient was given 2 weeks samples of Eliquis and patient assistance card for 30 days.  Patient is asked to call the office if she is unable to afford Eliquis we can discuss alternatives and consider patient assistance program.  Benign essential hypertension:   Office blood pressure is very well  controlled.  Continue current medical therapy.  Skin importance of low-salt diet.  Since last office visit she did undergo sleep evaluation and was diagnosed with obstructive sleep apnea of moderate intensity.  Is currently using CPAP machine.    Mixed hyperlipidemia: Continue statin therapy.  Most recent lipid profile reviewed.  Currently managed by PCP.  FINAL MEDICATION LIST END OF ENCOUNTER: Meds ordered this encounter  Medications  . metoprolol succinate (TOPROL XL) 25 MG 24 hr tablet    Sig: Take 1 tablet (25 mg total) by mouth daily.    Dispense:  90 tablet    Refill:  1  . apixaban (ELIQUIS) 5 MG TABS tablet    Sig: Take 1 tablet (5 mg total) by mouth 2 (two) times daily.    Dispense:  180 tablet  Refill:  1    Medications Discontinued During This Encounter  Medication Reason  . amLODipine (NORVASC) 5 MG tablet Discontinued by provider     Current Outpatient Medications:  .  ALPRAZolam (XANAX) 0.25 MG tablet, Take 0.25 mg by mouth 2 (two) times daily as needed for anxiety., Disp: , Rfl:  .  anastrozole (ARIMIDEX) 1 MG tablet, Take 1 tablet (1 mg total) by mouth daily., Disp: 90 tablet, Rfl: 3 .  atorvastatin (LIPITOR) 10 MG tablet, Take 10 mg by mouth daily. , Disp: , Rfl:  .  Calcium Carb-Cholecalciferol (CALCIUM 600 + D PO), Take 1 tablet by mouth 2 (two) times daily., Disp: , Rfl:  .  cetirizine (ZYRTEC) 10 MG tablet, Take 10 mg by mouth daily., Disp: , Rfl:  .  cholecalciferol (VITAMIN D3) 25 MCG (1000 UT) tablet, Take 1,000 Units by mouth daily., Disp: , Rfl:  .  EPINEPHrine 0.3 mg/0.3 mL IJ SOAJ injection, INJECT INTO OUTER THIGH AS NEEDED FOR SYSTEMIC REACTIONS, Disp: , Rfl:  .  methocarbamol (ROBAXIN) 500 MG tablet, Take 500 mg by mouth at bedtime., Disp: , Rfl:  .  nitroGLYCERIN (NITROSTAT) 0.4 MG SL tablet, DISSOLVE 1 TABLET UNDER THE TONGUE EVERY 5 MINUTES AS NEEDED FOR CHEST PAIN. IF YOU REQUIRE MORE THAN TWO TABLETS FIVE MINUTES APART GO TO THE NEAREST ER  VIA EMS., Disp: 100 tablet, Rfl: 1 .  omeprazole (PRILOSEC) 20 MG capsule, Take 20 mg by mouth daily., Disp: , Rfl:  .  telmisartan-hydrochlorothiazide (MICARDIS HCT) 80-12.5 MG tablet, Take 1 tablet by mouth daily., Disp: , Rfl:  .  apixaban (ELIQUIS) 5 MG TABS tablet, Take 1 tablet (5 mg total) by mouth 2 (two) times daily., Disp: 180 tablet, Rfl: 1 .  metoprolol succinate (TOPROL XL) 25 MG 24 hr tablet, Take 1 tablet (25 mg total) by mouth daily., Disp: 90 tablet, Rfl: 1  Orders Placed This Encounter  Procedures  . Basic metabolic panel  . Magnesium  . Hemoglobin and hematocrit, blood  . EKG 12-Lead   --Continue cardiac medications as reconciled in final medication list. --Return in about 3 months (around 08/29/2020) for Follow up, A. fib, EKG prior to arrival.. Or sooner if needed. --Continue follow-up with your primary care physician regarding the management of your other chronic comorbid conditions.  Patient's questions and concerns were addressed to her satisfaction. She voices understanding of the instructions provided during this encounter.   This note was created using a voice recognition software as a result there may be grammatical errors inadvertently enclosed that do not reflect the nature of this encounter. Every attempt is made to correct such errors.  Rex Kras, [place], [facility] ([address])  Pager: [phone] Office: [phone]

## 2020-05-29 NOTE — Progress Notes (Signed)
[facility] Follow up:    Kelly Battles, MD [place] [address]   DIAGNOSIS: Cancer Staging Carcinoma of upper-inner quadrant of right breast in female, estrogen receptor positive ([place]) Staging form: Breast, AJCC 8th Edition - Clinical: Stage IA (cT1c, cN0, cM0, G1, ER+, PR+, HER2-) - Signed by Eppie Gibson, MD on 07/31/2018 - Pathologic: Stage IA (pT1b, pN0, cM0, G2, ER+, PR+, HER2-) - Unsigned   SUMMARY OF ONCOLOGIC HISTORY: Oncology History  Carcinoma of upper-inner quadrant of right breast in female, estrogen receptor positive ([place])  07/31/2018 Initial Diagnosis   Screening detected right breast mass at 1 o'clock position 1.1 cm, no axillary lymph nodes, biopsy revealed IDC grade 1-2, ER 100%, PR 80%, Ki-67 5%, HER-2 negative, 2+ by IHC and ratio 1.22 by FISH, T1CN0 stage Ia   07/31/2018 Cancer Staging   Staging form: Breast, AJCC 8th Edition - Clinical: Stage IA (cT1c, cN0, cM0, G1, ER+, PR+, HER2-) - Signed by Eppie Gibson, MD on 07/31/2018   08/03/2018 Surgery   Right lumpectomy: IDC 0.6 cm, grade 2, margins negative, 0/4 lymph nodes negative, ER 100%, PR 80%, HER-2 equivocal by IHC, FISH negative ratio 1.95, Ki-67 5%, T1BN0 stage Ia   09/15/2018 - 10/10/2018 Radiation Therapy   Adjuvant radiation with Dr. Isidore Moos   10/10/2018 -  Anti-estrogen oral therapy   Anastrozole, 43m daily, plan for 5 years     CURRENT THERAPY: Anastrozole  INTERVAL HISTORY: [name]79y.o. female is here today for urgent evaluation.  She called yesterday afternoon reporting that 3 months ago her right breast became bright red, was itchy, hot and resolved with an ice pack in her bra and compression after a couple of days.  This happened again 2 weeks ago and then resolved again.  She She notes that she has a new dimple above her lumpectomy site that has been present for a few weeks as well.  She denies any other issues, and notes her next mammo is 12/29  and she is going to receive her COVID booster next week.     Patient Active Problem List   Diagnosis Date Noted  . Snoring 02/25/2020  . Hypertension 02/25/2020  . Angina at rest ([facility] 02/25/2020  . OSA on CPAP 12/2019  . Carcinoma of upper-inner quadrant of right breast in female, estrogen receptor positive ([facility] 07/31/2018    is allergic to beef allergy and sulfa antibiotics.  MEDICAL HISTORY: Past Medical History:  Diagnosis Date  . Allergy    seasonal allergies.   . Anxiety   . Arthritis    Neck  . Atrial fibrillation ([place]   . Breast cancer ([place]   . Hypercholesteremia   . Hypertension   . OSA on CPAP 12/2019  . Osteopenia   . Personal history of radiation therapy     SURGICAL HISTORY: Past Surgical History:  Procedure Laterality Date  . APPENDECTOMY     age 79 . BREAST LUMPECTOMY Right 08/03/2018  . BREAST LUMPECTOMY WITH RADIOACTIVE SEED AND SENTINEL LYMPH NODE BIOPSY Right 08/03/2018   Procedure: RIGHT BREAST LUMPECTOMY WITH RADIOACTIVE SEED AND SENTINEL LYMPH NODE BIOPSY WITH BLUE DYE INJECTION;  Surgeon: TDonnie Mesa MD;  [place]  Service: General;  Laterality: Right;  . bunionectmy     1986  . CHOLECYSTECTOMY     age 79 . TONSILLECTOMY      SOCIAL HISTORY: Social History   Socioeconomic History  . Marital status: Married  Spouse name: Not on file  . Number of children: 3  . Years of education: Not on file  . Highest education level: Not on file  Occupational History  . Not on file  Tobacco Use  . Smoking status: Never Smoker  . Smokeless tobacco: Never Used  Vaping Use  . Vaping Use: Never used  Substance and Sexual Activity  . Alcohol use: Not Currently    Comment: occasionally, a couple of times a month.   . Drug use: Never  . Sexual activity: Not on file  Other Topics Concern  . Not on file  Social History Narrative  . Not on file   Social Determinants of Health   Financial Resource Strain:   . Difficulty of  Paying Living Expenses: Not on file  Food Insecurity:   . Worried About Charity fundraiser in the Last Year: Not on file  . Ran Out of Food in the Last Year: Not on file  Transportation Needs:   . Lack of Transportation (Medical): Not on file  . Lack of Transportation (Non-Medical): Not on file  Physical Activity:   . Days of Exercise per Week: Not on file  . Minutes of Exercise per Session: Not on file  Stress:   . Feeling of Stress : Not on file  Social Connections:   . Frequency of Communication with Friends and Family: Not on file  . Frequency of Social Gatherings with Friends and Family: Not on file  . Attends Religious Services: Not on file  . Active Member of Clubs or Organizations: Not on file  . Attends Archivist Meetings: Not on file  . Marital Status: Not on file  Intimate Partner Violence:   . Fear of Current or Ex-Partner: Not on file  . Emotionally Abused: Not on file  . Physically Abused: Not on file  . Sexually Abused: Not on file    FAMILY HISTORY: Family History  Problem Relation Age of Onset  . Hypertension Mother   . Osteoporosis Mother   . Atrial fibrillation Mother   . Heart failure Mother   . Colon cancer Father   . Coronary artery disease Father   . AAA (abdominal aortic aneurysm) Father     Review of Systems  Constitutional: Negative for appetite change, chills, fatigue, fever and unexpected weight change.  HENT:   Negative for hearing loss, lump/mass and trouble swallowing.   Eyes: Negative for eye problems and icterus.  Respiratory: Negative for chest tightness, cough and shortness of breath.   Cardiovascular: Negative for chest pain, leg swelling and palpitations.  Gastrointestinal: Negative for abdominal distention, abdominal pain, constipation, diarrhea, nausea and vomiting.  Endocrine: Negative for hot flashes.  Genitourinary: Negative for difficulty urinating.   Musculoskeletal: Negative for arthralgias.  Skin: Negative for  itching and rash.  Neurological: Negative for dizziness, extremity weakness, headaches and numbness.  Hematological: Negative for adenopathy. Does not bruise/bleed easily.  Psychiatric/Behavioral: Negative for depression. The patient is not nervous/anxious.       PHYSICAL EXAMINATION  ECOG PERFORMANCE STATUS: 1 - Symptomatic but completely ambulatory  Vitals:   05/29/20 1540  BP: (!) 145/83  Pulse: 77  Resp: 18  Temp: 98 F (36.7 C)  SpO2: 100%    Physical Exam Constitutional:      General: She is not in acute distress.    Appearance: Normal appearance. She is not toxic-appearing.  HENT:     Head: Normocephalic and atraumatic.  Eyes:     General:  No scleral icterus. Cardiovascular:     Rate and Rhythm: Normal rate and regular rhythm.     Pulses: Normal pulses.     Heart sounds: Normal heart sounds.  Pulmonary:     Effort: Pulmonary effort is normal.     Breath sounds: Normal breath sounds.     Comments: Right breast s/p lumpectomy, dimpling noted in right upper inner quadrant of bresat, no mass or nodule palpated, left breast benign Abdominal:     General: Abdomen is flat. Bowel sounds are normal. There is no distension.     Palpations: Abdomen is soft.     Tenderness: There is no abdominal tenderness.  Musculoskeletal:        General: No swelling.     Cervical back: Neck supple.  Lymphadenopathy:     Cervical: No cervical adenopathy.  Skin:    General: Skin is warm and dry.     Findings: No rash.  Neurological:     General: No focal deficit present.     Mental Status: She is alert.  Psychiatric:        Mood and Affect: Mood normal.        Behavior: Behavior normal.     LABORATORY DATA:  CBC    Component Value Date/Time   WBC 6.5 07/31/2018 1138   RBC 4.61 07/31/2018 1138   HGB 12.8 05/30/2020 1113   HCT 39.7 05/30/2020 1113   PLT 268 07/31/2018 1138   MCV 89.8 07/31/2018 1138   MCH 29.5 07/31/2018 1138   MCHC 32.9 07/31/2018 1138   RDW 11.8  07/31/2018 1138    CMP     Component Value Date/Time   NA 135 05/30/2020 1113   K 3.7 05/30/2020 1113   CL 98 05/30/2020 1113   CO2 23 05/30/2020 1113   GLUCOSE 115 (H) 05/30/2020 1113   GLUCOSE 98 07/31/2018 1138   BUN 15 05/30/2020 1113   CREATININE 0.77 05/30/2020 1113   CALCIUM 9.5 05/30/2020 1113   GFRNONAA 74 05/30/2020 1113   GFRAA 85 05/30/2020 1113        ASSESSMENT and THERAPY PLAN:   Carcinoma of upper-inner quadrant of right breast in female, estrogen receptor positive ([place]) 07/17/2018:Screening detected right breast mass at 1 o'clock position 1.1 cm, no axillary lymph nodes, biopsy revealed IDC grade 1-2, ER 100%, PR 80%, Ki-67 5%, HER-2 negative, 2+ by IHC and ratio 1.22 by FISH, T1CN0 stage Ia  08/03/2018:Right lumpectomy: IDC 0.6 cm, grade 2, margins negative, 0/4 lymph nodes negative, ER 100%, PR 80%, HER-2 equivocal by IHC, FISH negative ratio 1.95, Ki-67 5%, T1BN0 stage Ia  Bone density 08/29/2018:T score -2: Osteopenia  Treatment plan: Adjuvant antiestrogen therapy with anastrozole 1 mg daily. Anastrozole toxicities: none  Due to her breast dimpling, she needs a mammogram and ultrasound.  I have placed orders for this.  I sent the breast navigators at [facility] a message requesting an expedited appointment considering her upcoming booster.    We will see her back as scheduled in January, 2022 for f/u with Dr. Lindi Adie for routine surveillance and monitoring.  She knows to call for any questions that may arise between now and her next appointment.  We are happy to see her sooner if needed.      Orders Placed This Encounter  Procedures  . US BREAST LTD UNI RIGHT INC AXILLA    [facility] TEAM ADVANTAGE MCR PF 07/05/19 @ [facility] ANNUAL NO PROBLEM /NO NEEDS /LUMPECTOMY 07/2018 HX BREAST CA  12/19 / [place] 09/2019 / [place] SWP    Standing Status:   Future    Standing Expiration Date:   05/29/2021    Order Specific Question:   Reason for Exam  (SYMPTOM  OR DIAGNOSIS REQUIRED)    Answer:   new breast change, right breast cancer    Order Specific Question:   Preferred imaging location?    Answer:   [facility]  . MM DIAG BREAST TOMO UNI RIGHT    INS HEALTH TEAM ADVANTAGE MCR PF 07/05/19 @ [facility] ANNUAL NO PROBLEM /NO NEEDS /LUMPECTOMY 07/2018 HX BREAST CA 12/19 / [place] 2ND 09/2019 / [place] SWP     Standing Status:   Future    Standing Expiration Date:   05/29/2021    Order Specific Question:   Reason for Exam (SYMPTOM  OR DIAGNOSIS REQUIRED)    Answer:   new breast change, right breast dimpling breast cancer    Order Specific Question:   Preferred imaging location?    Answer:   [facility]    Total encounter time: 30 minutes*  Wilber Bihari, NP 05/31/20 1:49 PM Medical Oncology and Hematology [facility] [place], [place] [address] Tel. [phone]    Fax. [phone]  *Total Encounter Time as defined by the Centers for Medicare and Medicaid Services includes, in addition to the face-to-face time of a patient visit (documented in the note above) non-face-to-face time: obtaining and reviewing outside history, ordering and reviewing medications, tests or procedures, care coordination (communications with other health care professionals or caregivers) and documentation in the medical record.

## 2020-05-30 ENCOUNTER — Telehealth: Payer: Self-pay

## 2020-05-30 ENCOUNTER — Other Ambulatory Visit: Payer: Self-pay | Admitting: Hematology and Oncology

## 2020-05-30 DIAGNOSIS — Z9889 Other specified postprocedural states: Secondary | ICD-10-CM

## 2020-05-30 DIAGNOSIS — I4891 Unspecified atrial fibrillation: Secondary | ICD-10-CM | POA: Diagnosis not present

## 2020-05-30 NOTE — Telephone Encounter (Signed)
Pt returned call to [facility] and states she will cancel MM and [place] appts for 12/10, as she is receiving her COVID booster next week and understands she needs to wait 6 weeks from time vaccine for scans. Pt will call GI to fix appts.

## 2020-05-31 ENCOUNTER — Encounter: Payer: Self-pay | Admitting: Adult Health

## 2020-05-31 LAB — MAGNESIUM: Magnesium: 1.7 mg/dL (ref 1.6–2.3)

## 2020-05-31 LAB — HEMOGLOBIN AND HEMATOCRIT, BLOOD
Hematocrit: 39.7 % (ref 34.0–46.6)
Hemoglobin: 12.8 g/dL (ref 11.1–15.9)

## 2020-05-31 LAB — BASIC METABOLIC PANEL
BUN/Creatinine Ratio: 19 (ref 12–28)
BUN: 15 mg/dL (ref 8–27)
CO2: 23 mmol/L (ref 20–29)
Calcium: 9.5 mg/dL (ref 8.7–10.3)
Chloride: 98 mmol/L (ref 96–106)
Creatinine, Ser: 0.77 mg/dL (ref 0.57–1.00)
GFR calc Af Amer: 85 mL/min/{1.73_m2} (ref >59)
GFR calc non Af Amer: 74 mL/min/{1.73_m2} (ref >59)
Glucose: 115 mg/dL — ABNORMAL HIGH (ref 65–99)
Potassium: 3.7 mmol/L (ref 3.5–5.2)
Sodium: 135 mmol/L (ref 134–144)

## 2020-06-01 DIAGNOSIS — G4733 Obstructive sleep apnea (adult) (pediatric): Secondary | ICD-10-CM | POA: Diagnosis not present

## 2020-06-02 ENCOUNTER — Ambulatory Visit: Payer: PPO | Admitting: Cardiology

## 2020-06-02 ENCOUNTER — Telehealth: Payer: Self-pay | Admitting: Adult Health

## 2020-06-02 NOTE — Telephone Encounter (Signed)
No 11/11 los. No changes made to pt's schedule.  

## 2020-06-17 DIAGNOSIS — J3089 Other allergic rhinitis: Secondary | ICD-10-CM | POA: Diagnosis not present

## 2020-06-17 DIAGNOSIS — J3081 Allergic rhinitis due to animal (cat) (dog) hair and dander: Secondary | ICD-10-CM | POA: Diagnosis not present

## 2020-06-17 DIAGNOSIS — Z91018 Allergy to other foods: Secondary | ICD-10-CM | POA: Diagnosis not present

## 2020-06-17 DIAGNOSIS — J301 Allergic rhinitis due to pollen: Secondary | ICD-10-CM | POA: Diagnosis not present

## 2020-06-27 ENCOUNTER — Other Ambulatory Visit: Payer: PPO

## 2020-07-01 DIAGNOSIS — G4733 Obstructive sleep apnea (adult) (pediatric): Secondary | ICD-10-CM | POA: Diagnosis not present

## 2020-07-16 ENCOUNTER — Ambulatory Visit
Admission: RE | Admit: 2020-07-16 | Discharge: 2020-07-16 | Disposition: A | Discharge status: Home / Self Care | Payer: PPO | Source: Ambulatory Visit | Attending: Hematology and Oncology | Admitting: Hematology and Oncology

## 2020-07-16 ENCOUNTER — Other Ambulatory Visit: Payer: PPO

## 2020-07-16 ENCOUNTER — Ambulatory Visit
Admission: RE | Admit: 2020-07-16 | Discharge: 2020-07-16 | Disposition: A | Discharge status: Home / Self Care | Payer: PPO | Source: Ambulatory Visit | Attending: Adult Health | Admitting: Adult Health

## 2020-07-16 ENCOUNTER — Other Ambulatory Visit: Payer: Self-pay

## 2020-07-16 DIAGNOSIS — N6489 Other specified disorders of breast: Secondary | ICD-10-CM | POA: Diagnosis not present

## 2020-07-16 DIAGNOSIS — Z853 Personal history of malignant neoplasm of breast: Secondary | ICD-10-CM | POA: Diagnosis not present

## 2020-07-16 DIAGNOSIS — Z170 Estrogen receptor positive status [ER+]: Secondary | ICD-10-CM

## 2020-07-16 DIAGNOSIS — Z9889 Other specified postprocedural states: Secondary | ICD-10-CM

## 2020-08-01 DIAGNOSIS — G4733 Obstructive sleep apnea (adult) (pediatric): Secondary | ICD-10-CM | POA: Diagnosis not present

## 2020-08-06 DIAGNOSIS — G4733 Obstructive sleep apnea (adult) (pediatric): Secondary | ICD-10-CM | POA: Diagnosis not present

## 2020-08-11 NOTE — Progress Notes (Signed)
Patient Care Team: Leanna Battles, MD as PCP - General (Internal Medicine) Nicholas Lose, MD as Consulting Physician (Hematology and Oncology) Eppie Gibson, MD as Attending Physician (Radiation Oncology) Donnie Mesa, MD as Consulting Physician (General Surgery)  DIAGNOSIS:    ICD-10-CM   1. Osteopenia, unspecified location  M85.80 DG Bone Density  2. Carcinoma of upper-inner quadrant of right breast in female, estrogen receptor positive ([place])  C50.211    Z17.0     SUMMARY OF ONCOLOGIC HISTORY: Oncology History  Carcinoma of upper-inner quadrant of right breast in female, estrogen receptor positive ([place])  07/31/2018 Initial Diagnosis   Screening detected right breast mass at 1 o'clock position 1.1 cm, no axillary lymph nodes, biopsy revealed IDC grade 1-2, ER 100%, PR 80%, Ki-67 5%, HER-2 negative, 2+ by IHC and ratio 1.22 by FISH, T1CN0 stage Ia   07/31/2018 Cancer Staging   Staging form: Breast, AJCC 8th Edition - Clinical: Stage IA (cT1c, cN0, cM0, G1, ER+, PR+, HER2-) - Signed by Eppie Gibson, MD on 07/31/2018   08/03/2018 Surgery   Right lumpectomy: IDC 0.6 cm, grade 2, margins negative, 0/4 lymph nodes negative, ER 100%, PR 80%, HER-2 equivocal by IHC, FISH negative ratio 1.95, Ki-67 5%, T1BN0 stage Ia   09/15/2018 - 10/10/2018 Radiation Therapy   Adjuvant radiation with Dr. Isidore Moos   10/10/2018 -  Anti-estrogen oral therapy   Anastrozole, 35m daily, plan for 5 years     CHIEF COMPLIANT: Follow-up of right breast cancer on anastrozole  INTERVAL HISTORY: CFALLYN MUNNERLYNis a 80y.o. with above-mentioned history of right breast cancer treated with lumpectomy, adjuvant radiation, and who is currently on antiestrogen therapy with anastrozole. Mammogram on 07/16/20 showed no evidence of malignancy bilaterally. She presents to the clinic today for follow-up.   She is tolerating anastrozole extremely well without any problems.  ALLERGIES:  is allergic to beef allergy and sulfa  antibiotics.  MEDICATIONS:  Current Outpatient Medications  Medication Sig Dispense Refill   ALPRAZolam (XANAX) 0.25 MG tablet Take 0.25 mg by mouth 2 (two) times daily as needed for anxiety.     anastrozole (ARIMIDEX) 1 MG tablet Take 1 tablet (1 mg total) by mouth daily. 90 tablet 3   apixaban (ELIQUIS) 5 MG TABS tablet Take 1 tablet (5 mg total) by mouth 2 (two) times daily. 180 tablet 1   atorvastatin (LIPITOR) 10 MG tablet Take 10 mg by mouth daily.      Calcium Carb-Cholecalciferol (CALCIUM 600 + D PO) Take 1 tablet by mouth 2 (two) times daily.     cetirizine (ZYRTEC) 10 MG tablet Take 10 mg by mouth daily.     cholecalciferol (VITAMIN D3) 25 MCG (1000 UT) tablet Take 1,000 Units by mouth daily.     EPINEPHrine 0.3 mg/0.3 mL IJ SOAJ injection INJECT INTO OUTER THIGH AS NEEDED FOR SYSTEMIC REACTIONS     methocarbamol (ROBAXIN) 500 MG tablet Take 500 mg by mouth at bedtime.     metoprolol succinate (TOPROL XL) 25 MG 24 hr tablet Take 1 tablet (25 mg total) by mouth daily. 90 tablet 1   nitroGLYCERIN (NITROSTAT) 0.4 MG SL tablet DISSOLVE 1 TABLET UNDER THE TONGUE EVERY 5 MINUTES AS NEEDED FOR CHEST PAIN. IF YOU REQUIRE MORE THAN TWO TABLETS FIVE MINUTES APART GO TO THE NEAREST ER VIA EMS. 100 tablet 1   omeprazole (PRILOSEC) 20 MG capsule Take 20 mg by mouth daily.     telmisartan-hydrochlorothiazide (MICARDIS HCT) 80-12.5 MG tablet Take 1 tablet by  mouth daily.     No current facility-administered medications for this visit.    PHYSICAL EXAMINATION: ECOG PERFORMANCE STATUS: 1 - Symptomatic but completely ambulatory  Vitals:   08/12/20 0912  BP: 127/68  Pulse: 77  Resp: 17  Temp: 97.7 F (36.5 C)  SpO2: 100%   Filed Weights   08/12/20 0912  Weight: 124 lb 11.2 oz (56.6 kg)      LABORATORY DATA:  I have reviewed the data as listed CMP Latest Ref Rng & Units 05/30/2020 07/31/2018  Glucose 65 - 99 mg/dL 115(H) 98  BUN 8 - 27 mg/dL 15 15  Creatinine 0.57 -  1.00 mg/dL 0.77 0.83  Sodium 134 - 144 mmol/L 135 134(L)  Potassium 3.5 - 5.2 mmol/L 3.7 3.6  Chloride 96 - 106 mmol/L 98 100  CO2 20 - 29 mmol/L 23 27  Calcium 8.7 - 10.3 mg/dL 9.5 9.6    Lab Results  Component Value Date   WBC 6.5 07/31/2018   HGB 12.8 05/30/2020   HCT 39.7 05/30/2020   MCV 89.8 07/31/2018   PLT 268 07/31/2018    ASSESSMENT & PLAN:  Carcinoma of upper-inner quadrant of right breast in female, estrogen receptor positive ([place]) 07/17/2018:Screening detected right breast mass at 1 o'clock position 1.1 cm, no axillary lymph nodes, biopsy revealed IDC grade 1-2, ER 100%, PR 80%, Ki-67 5%, HER-2 negative, 2+ by IHC and ratio 1.22 by FISH, T1CN0 stage Ia  08/03/2018:Right lumpectomy: IDC 0.6 cm, grade 2, margins negative, 0/4 lymph nodes negative, ER 100%, PR 80%, HER-2 equivocal by IHC, FISH negative ratio 1.95, Ki-67 5%, T1BN0 stage Ia  Bone density 08/29/2018:T score -2: Osteopenia I ordered a new bone density test to be done in the next 2 to 3 months.  Treatment plan: Adjuvant antiestrogen therapy with anastrozole 1 mg daily since March 2020. Anastrozole toxicities: none  Due to her breast dimpling, mammogram and ultrasound obtained on 07/16/2020: Postsurgical changes and no suspicious findings, breast density category C  I reassured her that the changes in the breast are benign. Return to clinic annually for follow-ups.    Orders Placed This Encounter  Procedures   DG Bone Density    Standing Status:   Future    Standing Expiration Date:   08/12/2021    Order Specific Question:   Reason for Exam (SYMPTOM  OR DIAGNOSIS REQUIRED)    Answer:   Post menopausal osteopenia    Order Specific Question:   Preferred imaging location?    Answer:   [facility]    Order Specific Question:   Release to patient    Answer:   Immediate   The patient has a good understanding of the overall plan. she agrees with it. she will call with any problems that may develop  before the next visit here.  Total time spent: 20 mins including face to face time and time spent for planning, charting and coordination of care  Nicholas Lose, MD 08/12/2020  I, Cloyde Reams Dorshimer, am acting as scribe for Dr. Nicholas Lose.  I have reviewed the above documentation for accuracy and completeness, and I agree with the above.

## 2020-08-12 ENCOUNTER — Inpatient Hospital Stay: Payer: PPO | Attending: Adult Health | Admitting: Hematology and Oncology

## 2020-08-12 ENCOUNTER — Other Ambulatory Visit: Payer: Self-pay

## 2020-08-12 VITALS — BP 127/68 | HR 77 | Temp 97.7°F | Resp 17 | Ht 61.5 in | Wt 124.7 lb

## 2020-08-12 DIAGNOSIS — M8580 Other specified disorders of bone density and structure, unspecified site: Secondary | ICD-10-CM | POA: Insufficient documentation

## 2020-08-12 DIAGNOSIS — Z170 Estrogen receptor positive status [ER+]: Secondary | ICD-10-CM | POA: Diagnosis not present

## 2020-08-12 DIAGNOSIS — C50211 Malignant neoplasm of upper-inner quadrant of right female breast: Secondary | ICD-10-CM | POA: Diagnosis not present

## 2020-08-12 DIAGNOSIS — Z79811 Long term (current) use of aromatase inhibitors: Secondary | ICD-10-CM | POA: Diagnosis not present

## 2020-08-12 MED ORDER — ANASTROZOLE 1 MG PO TABS: 1.0000 mg | ORAL_TABLET | Freq: Every day | ORAL | 3 refills | Status: DC

## 2020-08-12 NOTE — Assessment & Plan Note (Signed)
07/17/2018:Screening detected right breast mass at 1 o'clock position 1.1 cm, no axillary lymph nodes, biopsy revealed IDC grade 1-2, ER 100%, PR 80%, Ki-67 5%, HER-2 negative, 2+ by IHC and ratio 1.22 by FISH, T1CN0 stage Ia  08/03/2018:Right lumpectomy: IDC 0.6 cm, grade 2, margins negative, 0/4 lymph nodes negative, ER 100%, PR 80%, HER-2 equivocal by IHC, FISH negative ratio 1.95, Ki-67 5%, T1BN0 stage Ia  Bone density 08/29/2018:T score -2: Osteopenia  Treatment plan: Adjuvant antiestrogen therapy with anastrozole 1 mg daily. Anastrozole toxicities: none  Due to her breast dimpling, mammogram and ultrasound obtained on 07/16/2020: Postsurgical changes and no suspicious findings, breast density category C  I reassured her that the changes in the breast are benign. Return to clinic annually for follow-ups.

## 2020-08-13 ENCOUNTER — Telehealth: Payer: Self-pay | Admitting: Hematology and Oncology

## 2020-08-13 NOTE — Telephone Encounter (Signed)
Scheduled appts per 1/25 los. Left voicemail with appt date and time.

## 2020-08-26 ENCOUNTER — Telehealth: Payer: Self-pay | Admitting: Adult Health

## 2020-08-26 NOTE — Telephone Encounter (Signed)
Spoke to pt and relayed that we do change pressures but other then that no real changes.  I sent message to [employer] via CM to call and assist her.  She wants to see what comfort measures are available.  Has spoken to sleep coach. I relayed will sned message to [employer] about this.  She appreciated this , will call sleep coach again if does not hear from them.

## 2020-08-26 NOTE — Telephone Encounter (Addendum)
===  View-only below this line=== ----- Message ----- From: Charmian Muff Sent: 08/26/2020   4:53 PM EST To: Brandon Melnick, RN  Actually, per notes in our system, pt IS wanting to adjust her own pressures. We will not instruct pt on how to this this as those changes are by rx only. We will go over how to adjust tubing temp and humidity. Jacquelyne Balint, RN Will have an RT reach out to the patient.

## 2020-08-26 NOTE — Telephone Encounter (Signed)
Pt. states she is having problems going to settings on CPAP machine. She states she doesn't know if there are any restrictions for the prescription. Please advise.

## 2020-08-28 ENCOUNTER — Ambulatory Visit: Payer: PPO | Admitting: Cardiology

## 2020-08-28 ENCOUNTER — Other Ambulatory Visit: Payer: Self-pay

## 2020-08-28 ENCOUNTER — Encounter: Payer: Self-pay | Admitting: Cardiology

## 2020-08-28 DIAGNOSIS — I10 Essential (primary) hypertension: Secondary | ICD-10-CM | POA: Diagnosis not present

## 2020-08-28 DIAGNOSIS — I4819 Other persistent atrial fibrillation: Secondary | ICD-10-CM

## 2020-08-28 DIAGNOSIS — E782 Mixed hyperlipidemia: Secondary | ICD-10-CM | POA: Diagnosis not present

## 2020-08-28 DIAGNOSIS — G4733 Obstructive sleep apnea (adult) (pediatric): Secondary | ICD-10-CM | POA: Diagnosis not present

## 2020-08-28 DIAGNOSIS — Z9989 Dependence on other enabling machines and devices: Secondary | ICD-10-CM | POA: Diagnosis not present

## 2020-08-28 DIAGNOSIS — Z7901 Long term (current) use of anticoagulants: Secondary | ICD-10-CM

## 2020-08-28 NOTE — H&P (View-Only) (Signed)
Kelly Blackwell Date of Birth: February 04, 1941 MRN: [id number] Cardiologist:  [facility] (established care 11/15/2019) Former Cardiology Providers: Dr. Adrian Prows  Date: 08/28/20 Last Office Visit: 05/29/2020  Chief Complaint  Patient presents with  . Atrial Fibrillation  . Follow-up   HPI  Kelly Blackwell is a 80 y.o. female who presents to the office with a  chief complaint of " 52-monthfollow-up atrial fibrillation management."  Her past medical history and cardiovascular risk factors are: Sleep Apnea, atrial fibrillation, hypertension, hyperlipidemia, history of breast cancer, postmenopausal female, advanced age.   Patient was originally referred to the office for evaluation of chest pain at the request of her primary care provider back in April 2021.   At the last office visit in November 2020 when she was noted to have atrial fibrillation on a routine EKG.  After discussing the pathophysiology patient informed me that both her husband and mother-in-law have atrial fibrillation and therefore she is very well versed with its management.  Patient was started on Toprol-XL as well as Eliquis for thromboembolic prophylaxis.  She tolerated the medication well without any side effects or intolerances.  Patient does not endorse any evidence of bleeding.  She is here for 359-monthollow-up.  She is asymptomatic from a cardiovascular standpoint.  No recent hospitalizations or urgent care visits.  No history of premature coronary artery disease in the family or sudden cardiac death.  FUNCTIONAL STATUS: She walks 5 days a week for 30-45 minutes.    ALLERGIES: Allergies  Allergen Reactions  . Beef Allergy Diarrhea and Nausea And Vomiting  . Sulfa Antibiotics Other (See Comments)    Fever and rash     MEDICATION LIST PRIOR TO VISIT: Current Meds  Medication Sig  . ALPRAZolam (XANAX) 0.25 MG tablet Take 0.25 mg by mouth 2 (two) times daily as needed for anxiety.  . Marland Kitchennastrozole  (ARIMIDEX) 1 MG tablet Take 1 tablet (1 mg total) by mouth daily.  . Marland Kitchenpixaban (ELIQUIS) 5 MG TABS tablet Take 1 tablet (5 mg total) by mouth 2 (two) times daily.  . Marland Kitchentorvastatin (LIPITOR) 10 MG tablet Take 10 mg by mouth daily.   . Calcium Carb-Cholecalciferol (CALCIUM 600 + D PO) Take 1 tablet by mouth 2 (two) times daily.  . cetirizine (ZYRTEC) 10 MG tablet Take 10 mg by mouth daily.  . cholecalciferol (VITAMIN D3) 25 MCG (1000 UT) tablet Take 1,000 Units by mouth daily.  . Marland KitchenPINEPHrine 0.3 mg/0.3 mL IJ SOAJ injection INJECT INTO OUTER THIGH AS NEEDED FOR SYSTEMIC REACTIONS  . methocarbamol (ROBAXIN) 500 MG tablet Take 500 mg by mouth at bedtime.  . metoprolol succinate (TOPROL XL) 25 MG 24 hr tablet Take 1 tablet (25 mg total) by mouth daily.  . nitroGLYCERIN (NITROSTAT) 0.4 MG SL tablet DISSOLVE 1 TABLET UNDER THE TONGUE EVERY 5 MINUTES AS NEEDED FOR CHEST PAIN. IF YOU REQUIRE MORE THAN TWO TABLETS FIVE MINUTES APART GO TO THE NEAREST ER VIA EMS.  . Marland Kitchenmeprazole (PRILOSEC) 20 MG capsule Take 20 mg by mouth daily.  . Marland Kitchenelmisartan-hydrochlorothiazide (MICARDIS HCT) 80-12.5 MG tablet Take 1 tablet by mouth daily.     PAST MEDICAL HISTORY: Past Medical History:  Diagnosis Date  . Allergy    seasonal allergies.   . Anxiety   . Arthritis    Neck  . Atrial fibrillation ([facility]  . Breast cancer ([facility]  . Hypercholesteremia   . Hypertension   . OSA on CPAP 12/2019  . Osteopenia   .  Personal history of radiation therapy     PAST SURGICAL HISTORY: Past Surgical History:  Procedure Laterality Date  . APPENDECTOMY     age 25  . BREAST LUMPECTOMY Right 08/03/2018  . BREAST LUMPECTOMY WITH RADIOACTIVE SEED AND SENTINEL LYMPH NODE BIOPSY Right 08/03/2018   Procedure: RIGHT BREAST LUMPECTOMY WITH RADIOACTIVE SEED AND SENTINEL LYMPH NODE BIOPSY WITH BLUE DYE INJECTION;  Surgeon: Donnie Mesa, MD;  Location: [place];  Service: General;  Laterality: Right;  . bunionectmy     1986  .  CHOLECYSTECTOMY     age 88  . TONSILLECTOMY      FAMILY HISTORY: The patient family history includes AAA (abdominal aortic aneurysm) in her father; Atrial fibrillation in her mother; Colon cancer in her father; Coronary artery disease in her father; Heart failure in her mother; Hypertension in her mother; Osteoporosis in her mother.  SOCIAL HISTORY:  The patient  reports that she has never smoked. She has never used smokeless tobacco. She reports previous alcohol use. She reports that she does not use drugs.  REVIEW OF SYSTEMS: Review of Systems  Constitutional: Negative for chills and fever.  HENT: Negative for hoarse voice and nosebleeds.   Eyes: Negative for discharge, double vision and pain.  Cardiovascular: Negative for chest pain, claudication, dyspnea on exertion, leg swelling, near-syncope, orthopnea, palpitations, paroxysmal nocturnal dyspnea and syncope.  Respiratory: Negative for hemoptysis and shortness of breath.   Musculoskeletal: Negative for muscle cramps and myalgias.  Gastrointestinal: Negative for abdominal pain, constipation, diarrhea, hematemesis, hematochezia, melena, nausea and vomiting.  Neurological: Negative for dizziness and light-headedness.   PHYSICAL EXAM: Vitals with BMI 08/28/2020 08/12/2020 05/29/2020  Height 5' 1"  5' 1.5" 5' 1.5"  Weight 124 lbs 3 oz 124 lbs 11 oz 123 lbs 14 oz  BMI 23.48 13.08 65.78  Systolic 469 629 528  Diastolic 58 68 83  Pulse 66 77 77   CONSTITUTIONAL: Well-developed and well-nourished. No acute distress.  SKIN: Skin is warm and dry. No rash noted. No cyanosis. No pallor. No jaundice HEAD: Normocephalic and atraumatic.  EYES: No scleral icterus MOUTH/THROAT: Moist oral membranes.  NECK: No JVD present. No thyromegaly noted. No carotid bruits  LYMPHATIC: No visible cervical adenopathy.  CHEST Normal respiratory effort. No intercostal retractions  LUNGS: Clear to auscultation bilaterally.  No stridor. No wheezes. No rales.   CARDIOVASCULAR: Irregularly irregular, variable S1-S2, no murmurs rubs or gallops appreciated ABDOMINAL: No apparent ascites.  EXTREMITIES: No peripheral edema  HEMATOLOGIC: No significant bruising NEUROLOGIC: Oriented to person, place, and time. Nonfocal. Normal muscle tone.  PSYCHIATRIC: Normal mood and affect. Normal behavior. Cooperative  CARDIAC DATABASE: EKG: 11/15/2019: Normal sinus rhythm, 79 bpm, normal axis, ST depressions in the inferior and lateral leads suggestive of possible ischemia.  Prior EKG dated 07/31/2018 noted normal sinus with PVCs and nonspecific ST abnormality.  05/29/2020: Atrial fibrillation, 79 bpm, normal axis, poor R wave progression, ST-T changes in the inferolateral leads, without underlying injury pattern.  Compared to prior EKG normal sinus rhythm has transition to atrial fibrillation.  08/28/2020: Atrial fibrillation, 58bpm, nonspecific T-abnormality, without underlying injury pattern.   Echocardiogram: 11/21/2019: LVEF 60 to 65%, mild LVH, indeterminate diastolic filling pattern, elevated left atrial pressure, mild MR, mild TR.  Stress Testing: Lexiscan (Walking with mod Bruce)Tetrofosmin Stress Test 11/19/2019:  Myocardial perfusion is normal.  Overall LV systolic function is normal without regional wall motion abnormalities.  Stress LV EF: 61%.  No previous exam available for comparison. Low risk study.   Heart  Catheterization: None  LABORATORY DATA: CBC Latest Ref Rng & Units 05/30/2020 07/31/2018  WBC 4.0 - 10.5 K/uL - 6.5  Hemoglobin 11.1 - 15.9 g/dL 12.8 13.6  Hematocrit 34.0 - 46.6 % 39.7 41.4  Platelets 150 - 400 K/uL - 268    CMP Latest Ref Rng & Units 05/30/2020 07/31/2018  Glucose 65 - 99 mg/dL 115(H) 98  BUN 8 - 27 mg/dL 15 15  Creatinine 0.57 - 1.00 mg/dL 0.77 0.83  Sodium 134 - 144 mmol/L 135 134(L)  Potassium 3.5 - 5.2 mmol/L 3.7 3.6  Chloride 96 - 106 mmol/L 98 100  CO2 20 - 29 mmol/L 23 27  Calcium 8.7 - 10.3 mg/dL 9.5 9.6    External Labs: Collected: 10/11/2019 Creatinine 0.8 mg/dL. eGFR: 69 mL/min per 1.73 m Lipid profile: Total cholesterol 169, triglycerides 54, HDL 79, LDL 79, non-HDL 90 Hemoglobin A1c: None  IMPRESSION:    ICD-10-CM   1. Persistent atrial fibrillation (HCC)  I48.19 EKG 12-Lead    CBC    Basic metabolic panel    Magnesium    SARS-COV-2 RNA,(COVID-19) QUAL NAAT  2. Long term (current) use of anticoagulants  Z79.01   3. OSA on CPAP  G47.33    Z99.89   4. Benign hypertension  I10   5. Mixed hyperlipidemia  E78.2      RECOMMENDATIONS: Kelly Blackwell is a 80 y.o. female whose past medical history and cardiac risk factors include:Sleep Apnea, atrial fibrillation, hypertension, hyperlipidemia, history of breast cancer, postmenopausal female, advanced age.   Atrial fibrillation: Persistent . Rate control: Toprol-XL. Marland Kitchen Rhythm control: N/A. Marland Kitchen Thromboembolic prophylaxis: Eliquis  . CHA2DS2-VASc SCORE is 43 (age >6, HTN, female gender) which correlates to 4.0 % risk of stroke per year.  . Labs from November 2021 independently reviewed.  Patient's hemoglobin was approximately 12.8 g/dL prior to initiation of oral anticoagulation.  EKG still illustrate atrial fibrillation with controlled ventricular rate.  The shared decision was to proceed with direct-current cardioversion.  Patient does not want to undergo transesophageal echocardiogram prior to cardioversion.  Risks, benefits, and alternatives of direct current cardioversion reviewed with the and patient. Risk includes but not limited to: potential for post-cardioversion rhythms, life-threatening arrhythmias (ventricular tachycardia and fibrillation, profound bradycardia, cardiac arrest) requiring cardiopulmonary resuscitation, myocardial damage, acute pulmonary edema, skin burns, transient hypotension. Benefits include restoration of sinus rhythm. Alternatives to treatment were discussed, questions were answered, patient voices  understanding and provides verbal feedback.  Patient is willing to proceed.   Benign essential hypertension:   Office blood pressure is very well controlled.  Blood pressure log reviewed.  Patient's home blood pressure usually around 130/80 mmHg.  Continue current medical therapy.  Skin importance of low-salt diet.  Since last office visit she did undergo sleep evaluation and was diagnosed with obstructive sleep apnea of moderate intensity.  Is currently using CPAP machine.    Mixed hyperlipidemia: Continue statin therapy.  Most recent lipid profile reviewed.  Currently managed by PCP.  FINAL MEDICATION LIST END OF ENCOUNTER: No orders of the defined types were placed in this encounter.   There are no discontinued medications.   Current Outpatient Medications:  .  ALPRAZolam (XANAX) 0.25 MG tablet, Take 0.25 mg by mouth 2 (two) times daily as needed for anxiety., Disp: , Rfl:  .  anastrozole (ARIMIDEX) 1 MG tablet, Take 1 tablet (1 mg total) by mouth daily., Disp: 90 tablet, Rfl: 3 .  apixaban (ELIQUIS) 5 MG TABS tablet, Take 1 tablet (5 mg  total) by mouth 2 (two) times daily., Disp: 180 tablet, Rfl: 1 .  atorvastatin (LIPITOR) 10 MG tablet, Take 10 mg by mouth daily. , Disp: , Rfl:  .  Calcium Carb-Cholecalciferol (CALCIUM 600 + D PO), Take 1 tablet by mouth 2 (two) times daily., Disp: , Rfl:  .  cetirizine (ZYRTEC) 10 MG tablet, Take 10 mg by mouth daily., Disp: , Rfl:  .  cholecalciferol (VITAMIN D3) 25 MCG (1000 UT) tablet, Take 1,000 Units by mouth daily., Disp: , Rfl:  .  EPINEPHrine 0.3 mg/0.3 mL IJ SOAJ injection, INJECT INTO OUTER THIGH AS NEEDED FOR SYSTEMIC REACTIONS, Disp: , Rfl:  .  methocarbamol (ROBAXIN) 500 MG tablet, Take 500 mg by mouth at bedtime., Disp: , Rfl:  .  metoprolol succinate (TOPROL XL) 25 MG 24 hr tablet, Take 1 tablet (25 mg total) by mouth daily., Disp: 90 tablet, Rfl: 1 .  nitroGLYCERIN (NITROSTAT) 0.4 MG SL tablet, DISSOLVE 1 TABLET UNDER THE TONGUE  EVERY 5 MINUTES AS NEEDED FOR CHEST PAIN. IF YOU REQUIRE MORE THAN TWO TABLETS FIVE MINUTES APART GO TO THE NEAREST ER VIA EMS., Disp: 100 tablet, Rfl: 1 .  omeprazole (PRILOSEC) 20 MG capsule, Take 20 mg by mouth daily., Disp: , Rfl:  .  telmisartan-hydrochlorothiazide (MICARDIS HCT) 80-12.5 MG tablet, Take 1 tablet by mouth daily., Disp: , Rfl:   Orders Placed This Encounter  Procedures  . SARS-COV-2 RNA,(COVID-19) QUAL NAAT  . CBC  . Basic metabolic panel  . Magnesium  . EKG 12-Lead   --Continue cardiac medications as reconciled in final medication list. --Return in about 4 weeks (around 09/25/2020) for Post cardioversion.. Or sooner if needed. --Continue follow-up with your primary care physician regarding the management of your other chronic comorbid conditions.  Patient's questions and concerns were addressed to her satisfaction. She voices understanding of the instructions provided during this encounter.   This note was created using a voice recognition software as a result there may be grammatical errors inadvertently enclosed that do not reflect the nature of this encounter. Every attempt is made to correct such errors.  Rex Kras, [place], [facility]  Pager: [phone] Office: [phone]

## 2020-08-28 NOTE — Progress Notes (Signed)
[name] Date of Birth: 1941/03/11 MRN: [id number] Cardiologist:  Mechele Claude [facility] (established care 11/15/2019) Former Cardiology Providers: Dr. Adrian Prows  Date: 08/28/20 Last Office Visit: 05/29/2020  Chief Complaint  Patient presents with  . Atrial Fibrillation  . Follow-up   HPI  Kelly Blackwell is a 80 y.o. female who presents to the office with a  chief complaint of " 80-monthfollow-up atrial fibrillation management."  Her past medical history and cardiovascular risk factors are: Sleep Apnea, atrial fibrillation, hypertension, hyperlipidemia, history of breast cancer, postmenopausal female, advanced age.   Patient was originally referred to the office for evaluation of chest pain at the request of her primary care provider back in April 2021.   At the last office visit in November 2020 when she was noted to have atrial fibrillation on a routine EKG.  After discussing the pathophysiology patient informed me that both her husband and mother-in-law have atrial fibrillation and therefore she is very well versed with its management.  Patient was started on Toprol-XL as well as Eliquis for thromboembolic prophylaxis.  She tolerated the medication well without any side effects or intolerances.  Patient does not endorse any evidence of bleeding.  She is here for 353-monthollow-up.  She is asymptomatic from a cardiovascular standpoint.  No recent hospitalizations or urgent care visits.  No history of premature coronary artery disease in the family or sudden cardiac death.  FUNCTIONAL STATUS: She walks 5 days a week for 30-45 minutes.    ALLERGIES: Allergies  Allergen Reactions  . Beef Allergy Diarrhea and Nausea And Vomiting  . Sulfa Antibiotics Other (See Comments)    Fever and rash     MEDICATION LIST PRIOR TO VISIT: Current Meds  Medication Sig  . ALPRAZolam (XANAX) 0.25 MG tablet Take 0.25 mg by mouth 2 (two) times daily as needed for anxiety.  . Marland Kitchennastrozole  (ARIMIDEX) 1 MG tablet Take 1 tablet (1 mg total) by mouth daily.  . Marland Kitchenpixaban (ELIQUIS) 5 MG TABS tablet Take 1 tablet (5 mg total) by mouth 2 (two) times daily.  . Marland Kitchentorvastatin (LIPITOR) 10 MG tablet Take 10 mg by mouth daily.   . Calcium Carb-Cholecalciferol (CALCIUM 600 + D PO) Take 1 tablet by mouth 2 (two) times daily.  . cetirizine (ZYRTEC) 10 MG tablet Take 10 mg by mouth daily.  . cholecalciferol (VITAMIN D3) 25 MCG (1000 UT) tablet Take 1,000 Units by mouth daily.  . Marland KitchenPINEPHrine 0.3 mg/0.3 mL IJ SOAJ injection INJECT INTO OUTER THIGH AS NEEDED FOR SYSTEMIC REACTIONS  . methocarbamol (ROBAXIN) 500 MG tablet Take 500 mg by mouth at bedtime.  . metoprolol succinate (TOPROL XL) 25 MG 24 hr tablet Take 1 tablet (25 mg total) by mouth daily.  . nitroGLYCERIN (NITROSTAT) 0.4 MG SL tablet DISSOLVE 1 TABLET UNDER THE TONGUE EVERY 5 MINUTES AS NEEDED FOR CHEST PAIN. IF YOU REQUIRE MORE THAN TWO TABLETS FIVE MINUTES APART GO TO THE NEAREST ER VIA EMS.  . Marland Kitchenmeprazole (PRILOSEC) 20 MG capsule Take 20 mg by mouth daily.  . Marland Kitchenelmisartan-hydrochlorothiazide (MICARDIS HCT) 80-12.5 MG tablet Take 1 tablet by mouth daily.     PAST MEDICAL HISTORY: Past Medical History:  Diagnosis Date  . Allergy    seasonal allergies.   . Anxiety   . Arthritis    Neck  . [place]  . [place] ([facility]  . Hypercholesteremia   . Hypertension   . OSA on CPAP 12/2019  . Osteopenia   .  Personal history of radiation therapy     PAST SURGICAL HISTORY: Past Surgical History:  Procedure Laterality Date  . APPENDECTOMY     age 53  . BREAST LUMPECTOMY Right 08/03/2018  . BREAST LUMPECTOMY WITH RADIOACTIVE SEED AND SENTINEL LYMPH NODE BIOPSY Right 08/03/2018   Procedure: RIGHT BREAST LUMPECTOMY WITH RADIOACTIVE SEED AND SENTINEL LYMPH NODE BIOPSY WITH BLUE DYE INJECTION;  Surgeon: Donnie Mesa, MD;  Location: [place];  Service: General;  Laterality: Right;  . bunionectmy     1986  .  CHOLECYSTECTOMY     age 78  . TONSILLECTOMY      FAMILY HISTORY: The patient family history includes AAA (abdominal aortic aneurysm) in her father; Atrial fibrillation in her mother; Colon cancer in her father; Coronary artery disease in her father; Heart failure in her mother; Hypertension in her mother; Osteoporosis in her mother.  SOCIAL HISTORY:  The patient  reports that she has never smoked. She has never used smokeless tobacco. She reports previous alcohol use. She reports that she does not use drugs.  REVIEW OF SYSTEMS: Review of Systems  Constitutional: Negative for chills and fever.  HENT: Negative for hoarse voice and nosebleeds.   Eyes: Negative for discharge, double vision and pain.  Cardiovascular: Negative for chest pain, claudication, dyspnea on exertion, leg swelling, near-syncope, orthopnea, palpitations, paroxysmal nocturnal dyspnea and syncope.  Respiratory: Negative for hemoptysis and shortness of breath.   Musculoskeletal: Negative for muscle cramps and myalgias.  Gastrointestinal: Negative for abdominal pain, constipation, diarrhea, hematemesis, hematochezia, melena, nausea and vomiting.  Neurological: Negative for dizziness and light-headedness.   PHYSICAL EXAM: Vitals with BMI 08/28/2020 08/12/2020 05/29/2020  Height 5' 1"  5' 1.5" 5' 1.5"  Weight 124 lbs 3 oz 124 lbs 11 oz 123 lbs 14 oz  BMI 23.48 97.02 63.78  Systolic 588 502 774  Diastolic 58 68 83  Pulse 66 77 77   CONSTITUTIONAL: Well-developed and well-nourished. No acute distress.  SKIN: Skin is warm and dry. No rash noted. No cyanosis. No pallor. No jaundice HEAD: Normocephalic and atraumatic.  EYES: No scleral icterus MOUTH/THROAT: Moist oral membranes.  NECK: No JVD present. No thyromegaly noted. No carotid bruits  LYMPHATIC: No visible cervical adenopathy.  CHEST Normal respiratory effort. No intercostal retractions  LUNGS: Clear to auscultation bilaterally.  No stridor. No wheezes. No rales.   CARDIOVASCULAR: Irregularly irregular, variable S1-S2, no murmurs rubs or gallops appreciated ABDOMINAL: No apparent ascites.  EXTREMITIES: No peripheral edema  HEMATOLOGIC: No significant bruising NEUROLOGIC: Oriented to person, place, and time. Nonfocal. Normal muscle tone.  PSYCHIATRIC: Normal mood and affect. Normal behavior. Cooperative  CARDIAC DATABASE: EKG: 11/15/2019: Normal sinus rhythm, 79 bpm, normal axis, ST depressions in the inferior and lateral leads suggestive of possible ischemia.  Prior EKG dated 07/31/2018 noted normal sinus with PVCs and nonspecific ST abnormality.  05/29/2020: Atrial fibrillation, 79 bpm, normal axis, poor R wave progression, ST-T changes in the inferolateral leads, without underlying injury pattern.  Compared to prior EKG normal sinus rhythm has transition to atrial fibrillation.  08/28/2020: Atrial fibrillation, 58bpm, nonspecific T-abnormality, without underlying injury pattern.   Echocardiogram: 11/21/2019: LVEF 60 to 65%, mild LVH, indeterminate diastolic filling pattern, elevated left atrial pressure, mild MR, mild TR.  Stress Testing: Lexiscan (Walking with mod Bruce)Tetrofosmin Stress Test 11/19/2019:  Myocardial perfusion is normal.  Overall LV systolic function is normal without regional wall motion abnormalities.  Stress LV EF: 61%.  No previous exam available for comparison. Low risk study.   Heart  Catheterization: None  LABORATORY DATA: CBC Latest Ref Rng & Units 05/30/2020 07/31/2018  WBC 4.0 - 10.5 K/uL - 6.5  Hemoglobin 11.1 - 15.9 g/dL 12.8 13.6  Hematocrit 34.0 - 46.6 % 39.7 41.4  Platelets 150 - 400 K/uL - 268    CMP Latest Ref Rng & Units 05/30/2020 07/31/2018  Glucose 65 - 99 mg/dL 115(H) 98  BUN 8 - 27 mg/dL 15 15  Creatinine 0.57 - 1.00 mg/dL 0.77 0.83  Sodium 134 - 144 mmol/L 135 134(L)  Potassium 3.5 - 5.2 mmol/L 3.7 3.6  Chloride 96 - 106 mmol/L 98 100  CO2 20 - 29 mmol/L 23 27  Calcium 8.7 - 10.3 mg/dL 9.5 9.6    External Labs: Collected: 10/11/2019 Creatinine 0.8 mg/dL. eGFR: 69 mL/min per 1.73 m Lipid profile: Total cholesterol 169, triglycerides 54, HDL 79, LDL 79, non-HDL 90 Hemoglobin A1c: None  IMPRESSION:    ICD-10-CM   1. Persistent atrial fibrillation (HCC)  I48.19 EKG 12-Lead    CBC    Basic metabolic panel    Magnesium    SARS-COV-2 RNA,(COVID-19) QUAL NAAT  2. Long term (current) use of anticoagulants  Z79.01   3. OSA on CPAP  G47.33    Z99.89   4. Benign hypertension  I10   5. Mixed hyperlipidemia  E78.2      RECOMMENDATIONS: Kelly Blackwell is a 80 y.o. female whose past medical history and cardiac risk factors include:Sleep Apnea, atrial fibrillation, hypertension, hyperlipidemia, history of breast cancer, postmenopausal female, advanced age.   Atrial fibrillation: Persistent . Rate control: Toprol-XL. Marland Kitchen Rhythm control: N/A. Marland Kitchen Thromboembolic prophylaxis: Eliquis  . CHA2DS2-VASc SCORE is 4 (age >60, HTN, female gender) which correlates to 4.0 % risk of stroke per year.  . Labs from November 2021 independently reviewed.  Patient's hemoglobin was approximately 12.8 g/dL prior to initiation of oral anticoagulation.  EKG still illustrate atrial fibrillation with controlled ventricular rate.  The shared decision was to proceed with direct-current cardioversion.  Patient does not want to undergo transesophageal echocardiogram prior to cardioversion.  Risks, benefits, and alternatives of direct current cardioversion reviewed with the and patient. Risk includes but not limited to: potential for post-cardioversion rhythms, life-threatening arrhythmias (ventricular tachycardia and fibrillation, profound bradycardia, cardiac arrest) requiring cardiopulmonary resuscitation, myocardial damage, acute pulmonary edema, skin burns, transient hypotension. Benefits include restoration of sinus rhythm. Alternatives to treatment were discussed, questions were answered, patient voices  understanding and provides verbal feedback.  Patient is willing to proceed.   Benign essential hypertension:   Office blood pressure is very well controlled.  Blood pressure log reviewed.  Patient's home blood pressure usually around 130/80 mmHg.  Continue current medical therapy.  Skin importance of low-salt diet.  Since last office visit she did undergo sleep evaluation and was diagnosed with obstructive sleep apnea of moderate intensity.  Is currently using CPAP machine.    Mixed hyperlipidemia: Continue statin therapy.  Most recent lipid profile reviewed.  Currently managed by PCP.  FINAL MEDICATION LIST END OF ENCOUNTER: No orders of the defined types were placed in this encounter.   There are no discontinued medications.   Current Outpatient Medications:  .  ALPRAZolam (XANAX) 0.25 MG tablet, Take 0.25 mg by mouth 2 (two) times daily as needed for anxiety., Disp: , Rfl:  .  anastrozole (ARIMIDEX) 1 MG tablet, Take 1 tablet (1 mg total) by mouth daily., Disp: 90 tablet, Rfl: 3 .  apixaban (ELIQUIS) 5 MG TABS tablet, Take 1 tablet (5 mg  total) by mouth 2 (two) times daily., Disp: 180 tablet, Rfl: 1 .  atorvastatin (LIPITOR) 10 MG tablet, Take 10 mg by mouth daily. , Disp: , Rfl:  .  Calcium Carb-Cholecalciferol (CALCIUM 600 + D PO), Take 1 tablet by mouth 2 (two) times daily., Disp: , Rfl:  .  cetirizine (ZYRTEC) 10 MG tablet, Take 10 mg by mouth daily., Disp: , Rfl:  .  cholecalciferol (VITAMIN D3) 25 MCG (1000 UT) tablet, Take 1,000 Units by mouth daily., Disp: , Rfl:  .  EPINEPHrine 0.3 mg/0.3 mL IJ SOAJ injection, INJECT INTO OUTER THIGH AS NEEDED FOR SYSTEMIC REACTIONS, Disp: , Rfl:  .  methocarbamol (ROBAXIN) 500 MG tablet, Take 500 mg by mouth at bedtime., Disp: , Rfl:  .  metoprolol succinate (TOPROL XL) 25 MG 24 hr tablet, Take 1 tablet (25 mg total) by mouth daily., Disp: 90 tablet, Rfl: 1 .  nitroGLYCERIN (NITROSTAT) 0.4 MG SL tablet, DISSOLVE 1 TABLET UNDER THE TONGUE  EVERY 5 MINUTES AS NEEDED FOR CHEST PAIN. IF YOU REQUIRE MORE THAN TWO TABLETS FIVE MINUTES APART GO TO THE NEAREST ER VIA EMS., Disp: 100 tablet, Rfl: 1 .  omeprazole (PRILOSEC) 20 MG capsule, Take 20 mg by mouth daily., Disp: , Rfl:  .  telmisartan-hydrochlorothiazide (MICARDIS HCT) 80-12.5 MG tablet, Take 1 tablet by mouth daily., Disp: , Rfl:   Orders Placed This Encounter  Procedures  . SARS-COV-2 RNA,(COVID-19) QUAL NAAT  . CBC  . Basic metabolic panel  . Magnesium  . EKG 12-Lead   --Continue cardiac medications as reconciled in final medication list. --Return in about 4 weeks (around 09/25/2020) for Post cardioversion.. Or sooner if needed. --Continue follow-up with your primary care physician regarding the management of your other chronic comorbid conditions.  Patient's questions and concerns were addressed to her satisfaction. She voices understanding of the instructions provided during this encounter.   This note was created using a voice recognition software as a result there may be grammatical errors inadvertently enclosed that do not reflect the nature of this encounter. Every attempt is made to correct such errors.  Rex Kras, [place], [facility]  Pager: [phone] Office: [phone]

## 2020-08-29 LAB — MAGNESIUM: Magnesium: 1.9 mg/dL (ref 1.6–2.3)

## 2020-08-29 LAB — BASIC METABOLIC PANEL
BUN/Creatinine Ratio: 21 (ref 12–28)
BUN: 18 mg/dL (ref 8–27)
CO2: 24 mmol/L (ref 20–29)
Calcium: 10.4 mg/dL — ABNORMAL HIGH (ref 8.7–10.3)
Chloride: 99 mmol/L (ref 96–106)
Creatinine, Ser: 0.87 mg/dL (ref 0.57–1.00)
GFR calc Af Amer: 73 mL/min/{1.73_m2} (ref >59)
GFR calc non Af Amer: 64 mL/min/{1.73_m2} (ref >59)
Glucose: 102 mg/dL — ABNORMAL HIGH (ref 65–99)
Potassium: 4.5 mmol/L (ref 3.5–5.2)
Sodium: 139 mmol/L (ref 134–144)

## 2020-08-29 LAB — CBC
Hematocrit: 43.3 % (ref 34.0–46.6)
Hemoglobin: 14.3 g/dL (ref 11.1–15.9)
MCH: 27.8 pg (ref 26.6–33.0)
MCHC: 33 g/dL (ref 31.5–35.7)
MCV: 84 fL (ref 79–97)
Platelets: 211 10*3/uL (ref 150–450)
RBC: 5.15 x10E6/uL (ref 3.77–5.28)
RDW: 12.8 % (ref 11.7–15.4)
WBC: 7.1 10*3/uL (ref 3.4–10.8)

## 2020-09-01 ENCOUNTER — Other Ambulatory Visit (HOSPITAL_COMMUNITY)
Admission: RE | Admit: 2020-09-01 | Discharge: 2020-09-01 | Disposition: A | Discharge status: Home / Self Care | Payer: PPO | Source: Ambulatory Visit | Attending: Cardiology | Admitting: Cardiology

## 2020-09-01 DIAGNOSIS — Z01812 Encounter for preprocedural laboratory examination: Secondary | ICD-10-CM | POA: Insufficient documentation

## 2020-09-01 DIAGNOSIS — Z20822 Contact with and (suspected) exposure to covid-19: Secondary | ICD-10-CM | POA: Diagnosis not present

## 2020-09-01 DIAGNOSIS — G4733 Obstructive sleep apnea (adult) (pediatric): Secondary | ICD-10-CM | POA: Diagnosis not present

## 2020-09-01 LAB — SARS CORONAVIRUS 2 (TAT 6-24 HRS): SARS Coronavirus 2: NEGATIVE

## 2020-09-03 ENCOUNTER — Encounter (HOSPITAL_COMMUNITY): Payer: Self-pay | Admitting: Cardiology

## 2020-09-03 ENCOUNTER — Ambulatory Visit (HOSPITAL_COMMUNITY): Payer: PPO | Admitting: Registered Nurse

## 2020-09-03 ENCOUNTER — Ambulatory Visit (HOSPITAL_COMMUNITY)
Admission: RE | Admit: 2020-09-03 | Discharge: 2020-09-03 | Disposition: A | Discharge status: Home / Self Care | Payer: PPO | Attending: Cardiology | Admitting: Cardiology

## 2020-09-03 ENCOUNTER — Encounter (HOSPITAL_COMMUNITY)
Admission: RE | Disposition: A | Discharge status: Home / Self Care | Payer: Self-pay | Source: Home / Self Care | Attending: Cardiology

## 2020-09-03 ENCOUNTER — Other Ambulatory Visit: Payer: Self-pay

## 2020-09-03 DIAGNOSIS — E782 Mixed hyperlipidemia: Secondary | ICD-10-CM | POA: Diagnosis not present

## 2020-09-03 DIAGNOSIS — I4819 Other persistent atrial fibrillation: Secondary | ICD-10-CM | POA: Diagnosis not present

## 2020-09-03 DIAGNOSIS — I4891 Unspecified atrial fibrillation: Secondary | ICD-10-CM | POA: Diagnosis not present

## 2020-09-03 DIAGNOSIS — Z7901 Long term (current) use of anticoagulants: Secondary | ICD-10-CM | POA: Insufficient documentation

## 2020-09-03 DIAGNOSIS — G4733 Obstructive sleep apnea (adult) (pediatric): Secondary | ICD-10-CM | POA: Diagnosis not present

## 2020-09-03 DIAGNOSIS — Z9989 Dependence on other enabling machines and devices: Secondary | ICD-10-CM | POA: Diagnosis not present

## 2020-09-03 DIAGNOSIS — I10 Essential (primary) hypertension: Secondary | ICD-10-CM | POA: Insufficient documentation

## 2020-09-03 DIAGNOSIS — Z79899 Other long term (current) drug therapy: Secondary | ICD-10-CM | POA: Insufficient documentation

## 2020-09-03 HISTORY — PX: CARDIOVERSION: SHX1299

## 2020-09-03 SURGERY — CARDIOVERSION
Anesthesia: General

## 2020-09-03 MED ORDER — SILVER SULFADIAZINE 1 % EX CREA: TOPICAL_CREAM | CUTANEOUS | 1 refills | Status: DC

## 2020-09-03 MED ORDER — PROPOFOL 10 MG/ML IV BOLUS
INTRAVENOUS | Status: DC | PRN
  Administered 2020-09-03: 50 mg via INTRAVENOUS

## 2020-09-03 MED ORDER — LIDOCAINE 2% (20 MG/ML) 5 ML SYRINGE
INTRAMUSCULAR | Status: DC | PRN
  Administered 2020-09-03: 60 mg via INTRAVENOUS

## 2020-09-03 MED ORDER — SODIUM CHLORIDE 0.9 % IV SOLN: INTRAVENOUS | Status: DC | PRN

## 2020-09-03 NOTE — Anesthesia Preprocedure Evaluation (Addendum)
Anesthesia Evaluation  Patient identified by MRN, date of birth, ID band Patient awake    Reviewed: Allergy & Precautions, H&P , NPO status , Patient's Chart, lab work & pertinent test results  Airway Mallampati: II  TM Distance: >3 FB Neck ROM: Full    Dental no notable dental hx. (+) Teeth Intact, Dental Advisory Given   Pulmonary sleep apnea ,    Pulmonary exam normal breath sounds clear to auscultation       Cardiovascular hypertension, Pt. on medications and Pt. on home beta blockers + dysrhythmias Atrial Fibrillation  Rhythm:Irregular Rate:Normal     Neuro/Psych Anxiety negative neurological ROS     GI/Hepatic negative GI ROS, Neg liver ROS,   Endo/Other  negative endocrine ROS  Renal/GU negative Renal ROS  negative genitourinary   Musculoskeletal  (+) Arthritis , Osteoarthritis,    Abdominal   Peds  Hematology negative hematology ROS (+)   Anesthesia Other Findings   Reproductive/Obstetrics negative OB ROS                            Anesthesia Physical Anesthesia Plan  ASA: III  Anesthesia Plan: General   Post-op Pain Management:    Induction: Intravenous  PONV Risk Score and Plan: 3 and Propofol infusion and Treatment may vary due to age or medical condition  Airway Management Planned: Mask  Additional Equipment:   Intra-op Plan:   Post-operative Plan:   Informed Consent: I have reviewed the patients History and Physical, chart, labs and discussed the procedure including the risks, benefits and alternatives for the proposed anesthesia with the patient or authorized representative who has indicated his/her understanding and acceptance.     Dental advisory given  Plan Discussed with: CRNA  Anesthesia Plan Comments:         Anesthesia Quick Evaluation

## 2020-09-03 NOTE — Anesthesia Postprocedure Evaluation (Signed)
Anesthesia Post Note  Patient: Kelly Blackwell  Procedure(s) Performed: CARDIOVERSION (N/A )     Patient location during evaluation: PACU Anesthesia Type: General Level of consciousness: awake and alert Pain management: pain level controlled Vital Signs Assessment: post-procedure vital signs reviewed and stable Respiratory status: spontaneous breathing, nonlabored ventilation and respiratory function stable Cardiovascular status: blood pressure returned to baseline and stable Postop Assessment: no apparent nausea or vomiting Anesthetic complications: no   No complications documented.  Last Vitals:  Vitals:   09/03/20 1347 09/03/20 1350  BP: (!) 162/75 (!) 162/75  Pulse: (!) 59 61  Resp: 18 11  Temp:    SpO2: 100% 100%    Last Pain:  Vitals:   09/03/20 1347  TempSrc:   PainSc: 0-No pain                 Maurie Musco,W. EDMOND

## 2020-09-03 NOTE — Transfer of Care (Signed)
Immediate Anesthesia Transfer of Care Note  Patient: Kelly Blackwell  Procedure(s) Performed: CARDIOVERSION (N/A )  Patient Location: PACU and Endoscopy Unit  Anesthesia Type:General  Level of Consciousness: drowsy  Airway & Oxygen Therapy: Patient Spontanous Breathing  Post-op Assessment: Report given to RN and Post -op Vital signs reviewed and stable  Post vital signs: Reviewed and stable  Last Vitals:  Vitals Value Taken Time  BP    Temp    Pulse    Resp    SpO2      Last Pain:  Vitals:   09/03/20 1241  TempSrc: Temporal  PainSc: 0-No pain         Complications: No complications documented.

## 2020-09-03 NOTE — Interval H&P Note (Signed)
History and Physical Interval Note:  09/03/2020 1:10 PM  Kelly Blackwell  has presented today for surgery, with the diagnosis of AFIB.  The various methods of treatment have been discussed with the patient and family. After consideration of risks, benefits and other options for treatment, the patient has consented to  Procedure(s): CARDIOVERSION (N/A) as a surgical intervention.  The patient's history has been reviewed, patient examined, no change in status, stable for surgery.  I have reviewed the patient's chart and labs.  Questions were answered to the patient's satisfaction.     [employer]  Pager: [phone] Office: [phone]

## 2020-09-03 NOTE — Anesthesia Procedure Notes (Signed)
Date/Time: 09/03/2020 1:24 PM Performed by: Trinna Post., CRNA Pre-anesthesia Checklist: Patient identified, Emergency Drugs available, Suction available, Patient being monitored and Timeout performed Patient Re-evaluated:Patient Re-evaluated prior to induction Oxygen Delivery Method: Ambu bag Preoxygenation: Pre-oxygenation with 100% oxygen Induction Type: IV induction Placement Confirmation: positive ETCO2

## 2020-09-03 NOTE — Brief Op Note (Signed)
Procedure: Electrical Cardioversion Indications:  Atrial Fibrillation  Procedure Details:  Consent: Risks of procedure as well as the alternatives and risks of each were explained to the (patient/caregiver).  Consent for procedure obtained.  Time Out: Verified patient identification, verified procedure, site/side was marked, verified correct patient position, special equipment/implants available, medications/allergies/relevent history reviewed, required imaging and test results available. PERFORMED.  Patient placed on cardiac monitor, pulse oximetry, supplemental oxygen as necessary.  Sedation given: 60cc lidocaine and 50cc propofol. Please see anesthesia records for more details.  Pacer pads placed anterior and posterior chest.  Cardioverted 1 time(s).  Cardioversion with synchronized biphasic 200J shock.  Evaluation: Findings: Post procedure EKG shows: NSR Complications: None Patient did tolerate procedure well.  Both patient and her husband updated s/p cardioversion. Post-cardioversion ECG obtained.  Follow up office visit in 2 weeks, ECG on arrival.  Continue Metoprolol and Eliquis.   Mechele Claude [facility]  Pager: [phone] Office: [phone] 09/03/2020, 1:32 PM

## 2020-09-03 NOTE — Discharge Instructions (Signed)
Electrical Cardioversion Electrical cardioversion is the delivery of a jolt of electricity to restore a normal rhythm to the heart. A rhythm that is too fast or is not regular keeps the heart from pumping well. In this procedure, sticky patches or metal paddles are placed on the chest to deliver electricity to the heart from a device. This procedure may be done in an emergency if:  There is low or no blood pressure as a result of the heart rhythm.  Normal rhythm must be restored as fast as possible to protect the brain and heart from further damage.  It may save a life. This may also be a scheduled procedure for irregular or fast heart rhythms that are not immediately life-threatening. Tell a health care provider about:  Any allergies you have.  All medicines you are taking, including vitamins, herbs, eye drops, creams, and over-the-counter medicines.  Any problems you or family members have had with anesthetic medicines.  Any blood disorders you have.  Any surgeries you have had.  Any medical conditions you have.  Whether you are pregnant or may be pregnant. What are the risks? Generally, this is a safe procedure. However, problems may occur, including:  Allergic reactions to medicines.  A blood clot that breaks free and travels to other parts of your body.  The possible return of an abnormal heart rhythm within hours or days after the procedure.  Your heart stopping (cardiac arrest). This is rare. What happens before the procedure? Medicines  Your health care provider may have you start taking: ? Blood-thinning medicines (anticoagulants) so your blood does not clot as easily. ? Medicines to help stabilize your heart rate and rhythm.  Ask your health care provider about: ? Changing or stopping your regular medicines. This is especially important if you are taking diabetes medicines or blood thinners. ? Taking medicines such as aspirin and ibuprofen. These medicines can  thin your blood. Do not take these medicines unless your health care provider tells you to take them. ? Taking over-the-counter medicines, vitamins, herbs, and supplements. General instructions  Follow instructions from your health care provider about eating or drinking restrictions.  Plan to have someone take you home from the hospital or clinic.  If you will be going home right after the procedure, plan to have someone with you for 24 hours.  Ask your health care provider what steps will be taken to help prevent infection. These may include washing your skin with a germ-killing soap. What happens during the procedure?  An IV will be inserted into one of your veins.  Sticky patches (electrodes) or metal paddles may be placed on your chest.  You will be given a medicine to help you relax (sedative).  An electrical shock will be delivered. The procedure may vary among health care providers and hospitals.   What can I expect after the procedure?  Your blood pressure, heart rate, breathing rate, and blood oxygen level will be monitored until you leave the hospital or clinic.  Your heart rhythm will be watched to make sure it does not change.  You may have some redness on the skin where the shocks were given. Follow these instructions at home:  Do not drive for 24 hours if you were given a sedative during your procedure.  Take over-the-counter and prescription medicines only as told by your health care provider.  Ask your health care provider how to check your pulse. Check it often.  Rest for 48 hours after the procedure   or as told by your health care provider.  Avoid or limit your caffeine use as told by your health care provider.  Keep all follow-up visits as told by your health care provider. This is important. Contact a health care provider if:  You feel like your heart is beating too quickly or your pulse is not regular.  You have a serious muscle cramp that does not go  away. Get help right away if:  You have discomfort in your chest.  You are dizzy or you feel faint.  You have trouble breathing or you are short of breath.  Your speech is slurred.  You have trouble moving an arm or leg on one side of your body.  Your fingers or toes turn cold or blue. Summary  Electrical cardioversion is the delivery of a jolt of electricity to restore a normal rhythm to the heart.  This procedure may be done right away in an emergency or may be a scheduled procedure if the condition is not an emergency.  Generally, this is a safe procedure.  After the procedure, check your pulse often as told by your health care provider. This information is not intended to replace advice given to you by your health care provider. Make sure you discuss any questions you have with your health care provider. Document Revised: 02/05/2019 Document Reviewed: 02/05/2019 Elsevier Patient Education  2021 [employer]  

## 2020-09-07 ENCOUNTER — Encounter (HOSPITAL_COMMUNITY): Payer: Self-pay | Admitting: Cardiology

## 2020-09-08 ENCOUNTER — Ambulatory Visit: Payer: PPO | Admitting: Cardiology

## 2020-09-25 ENCOUNTER — Ambulatory Visit: Payer: PPO | Admitting: Cardiology

## 2020-09-25 ENCOUNTER — Encounter: Payer: Self-pay | Admitting: Cardiology

## 2020-09-25 ENCOUNTER — Other Ambulatory Visit: Payer: Self-pay

## 2020-09-25 VITALS — BP 140/78 | HR 61 | Temp 97.6°F | Resp 16 | Ht 61.0 in | Wt 126.6 lb

## 2020-09-25 DIAGNOSIS — I4819 Other persistent atrial fibrillation: Secondary | ICD-10-CM | POA: Diagnosis not present

## 2020-09-25 DIAGNOSIS — I10 Essential (primary) hypertension: Secondary | ICD-10-CM

## 2020-09-25 DIAGNOSIS — Z7901 Long term (current) use of anticoagulants: Secondary | ICD-10-CM | POA: Diagnosis not present

## 2020-09-25 DIAGNOSIS — E782 Mixed hyperlipidemia: Secondary | ICD-10-CM

## 2020-09-25 DIAGNOSIS — G4733 Obstructive sleep apnea (adult) (pediatric): Secondary | ICD-10-CM

## 2020-09-25 DIAGNOSIS — Z9889 Other specified postprocedural states: Secondary | ICD-10-CM | POA: Diagnosis not present

## 2020-09-25 DIAGNOSIS — Z9989 Dependence on other enabling machines and devices: Secondary | ICD-10-CM | POA: Diagnosis not present

## 2020-09-25 NOTE — Progress Notes (Signed)
[name] Date of Birth: 30-Mar-1941 MRN: [id number] Cardiologist:  Mechele Claude [facility] (established care 11/15/2019) Former Cardiology Providers: Dr. Adrian Prows  Date: 09/25/20 Last Office Visit: 08/28/2020  Chief Complaint  Patient presents with  . Hypertension  . Persistent atrial fibrillation   . Follow-up    4 weeks    HPI  Kelly Blackwell is a 80 y.o. female who presents to the office with a  chief complaint of " status post cardioversion and atrial fibrillation management."   Her past medical history and cardiovascular risk factors are: Sleep Apnea, atrial fibrillation, hypertension, hyperlipidemia, history of breast cancer, postmenopausal female, advanced age.   Patient was originally referred to the office for evaluation of chest pain at the request of her primary care provider back in April 2021.   During her office visit back in November 2020 she was noted to have atrial fibrillation on surface ECG.  The shared decision was to control her ventricular rate with the hopes that she was spontaneously converted to normal sinus rhythm.  However,.  To be in persistent atrial fibrillation and therefore the shared decision was to undergo direct-current cardioversion.  Last month in February 2022 patient underwent direct-current cardioversion is here for 2-week follow-up.  Patient states that she feels well from a cardiovascular standpoint.  She is compliant with her Eliquis regards to thromboembolic prophylaxis.  Does not notice any evidence of bleeding.  No history of premature coronary artery disease in the family or sudden cardiac death.  FUNCTIONAL STATUS: She walks 5 days a week for 30-45 minutes.    ALLERGIES: Allergies  Allergen Reactions  . Beef Allergy Diarrhea and Nausea And Vomiting  . Sulfa Antibiotics Other (See Comments)    Fever and rash     MEDICATION LIST PRIOR TO VISIT: Current Meds  Medication Sig  . acetaminophen (TYLENOL) 500 MG tablet Take  500-1,000 mg by mouth every 6 (six) hours as needed (pain).  Marland Kitchen ALPRAZolam (XANAX) 0.25 MG tablet Take 0.25 mg by mouth See admin instructions. Take 1 tablet (0.25 mg) by mouth scheduled at night for sleep; may take an additional dose during the day if needed for anxiety  . anastrozole (ARIMIDEX) 1 MG tablet Take 1 tablet (1 mg total) by mouth daily.  Marland Kitchen apixaban (ELIQUIS) 5 MG TABS tablet Take 1 tablet (5 mg total) by mouth 2 (two) times daily.  Marland Kitchen atorvastatin (LIPITOR) 10 MG tablet Take 10 mg by mouth daily.   . Calcium Carb-Cholecalciferol (CALCIUM 600 + D PO) Take 1 tablet by mouth 2 (two) times daily.  . cetirizine (ZYRTEC) 10 MG tablet Take 10 mg by mouth daily.  . cholecalciferol (VITAMIN D3) 25 MCG (1000 UT) tablet Take 1,000 Units by mouth daily.  Marland Kitchen EPINEPHrine 0.3 mg/0.3 mL IJ SOAJ injection Inject 0.3 mg into the muscle as needed for anaphylaxis.  . methocarbamol (ROBAXIN) 500 MG tablet Take 250 mg by mouth at bedtime. Leg cramps  . metoprolol succinate (TOPROL XL) 25 MG 24 hr tablet Take 1 tablet (25 mg total) by mouth daily. (Patient taking differently: Take 25 mg by mouth every evening.)  . nitroGLYCERIN (NITROSTAT) 0.4 MG SL tablet DISSOLVE 1 TABLET UNDER THE TONGUE EVERY 5 MINUTES AS NEEDED FOR CHEST PAIN. IF YOU REQUIRE MORE THAN TWO TABLETS FIVE MINUTES APART GO TO THE NEAREST ER VIA EMS. (Patient taking differently: Place 0.4 mg under the tongue every 5 (five) minutes x 3 doses as needed for chest pain.)  . omeprazole (  PRILOSEC) 20 MG capsule Take 20 mg by mouth daily.  Marland Kitchen telmisartan-hydrochlorothiazide (MICARDIS HCT) 80-12.5 MG tablet Take 1 tablet by mouth daily.     PAST MEDICAL HISTORY: Past Medical History:  Diagnosis Date  . Allergy    seasonal allergies.   . Anxiety   . Arthritis    Neck  . Atrial fibrillation ([place])   . Breast cancer ([place])   . Hypercholesteremia   . Hypertension   . OSA on CPAP 12/2019  . Osteopenia   . Personal history of radiation therapy      PAST SURGICAL HISTORY: Past Surgical History:  Procedure Laterality Date  . APPENDECTOMY     age 28  . BREAST LUMPECTOMY Right 08/03/2018  . BREAST LUMPECTOMY WITH RADIOACTIVE SEED AND SENTINEL LYMPH NODE BIOPSY Right 08/03/2018   Procedure: RIGHT BREAST LUMPECTOMY WITH RADIOACTIVE SEED AND SENTINEL LYMPH NODE BIOPSY WITH BLUE DYE INJECTION;  Surgeon: Donnie Mesa, MD;  Location: [facility];  Service: General;  Laterality: Right;  . bunionectmy     1986  . CARDIOVERSION N/A 09/03/2020   Procedure: CARDIOVERSION;  Surgeon: Rex Kras, DO;  Location: [facility];  Service: Cardiovascular;  Laterality: N/A;  . CHOLECYSTECTOMY     age 35  . TONSILLECTOMY      FAMILY HISTORY: The patient family history includes AAA (abdominal aortic aneurysm) in her father; Atrial fibrillation in her mother; Colon cancer in her father; Coronary artery disease in her father; Heart failure in her mother; Hypertension in her mother; Osteoporosis in her mother.  SOCIAL HISTORY:  The patient  reports that she has never smoked. She has never used smokeless tobacco. She reports previous alcohol use. She reports that she does not use drugs.  REVIEW OF SYSTEMS: Review of Systems  Constitutional: Negative for chills and fever.  HENT: Negative for hoarse voice and nosebleeds.   Eyes: Negative for discharge, double vision and pain.  Cardiovascular: Negative for chest pain, claudication, dyspnea on exertion, leg swelling, near-syncope, orthopnea, palpitations, paroxysmal nocturnal dyspnea and syncope.  Respiratory: Negative for hemoptysis and shortness of breath.   Musculoskeletal: Negative for muscle cramps and myalgias.  Gastrointestinal: Negative for abdominal pain, constipation, diarrhea, hematemesis, hematochezia, melena, nausea and vomiting.  Neurological: Negative for dizziness and light-headedness.   PHYSICAL EXAM: Vitals with BMI 09/25/2020 09/03/2020 09/03/2020  Height 5' 1"  - -  Weight 126 lbs 10 oz -  -  BMI 62.94 - -  Systolic [phone]  Diastolic 78 75 75  Pulse 61 61 59   CONSTITUTIONAL: Well-developed and well-nourished. No acute distress.  SKIN: Skin is warm and dry. No rash noted. No cyanosis. No pallor. No jaundice HEAD: Normocephalic and atraumatic.  EYES: No scleral icterus MOUTH/THROAT: Moist oral membranes.  NECK: No JVD present. No thyromegaly noted. No carotid bruits  LYMPHATIC: No visible cervical adenopathy.  CHEST Normal respiratory effort. No intercostal retractions  LUNGS: Clear to auscultation bilaterally.  No stridor. No wheezes. No rales.  CARDIOVASCULAR: Irregularly irregular, variable S1-S2, no murmurs rubs or gallops appreciated ABDOMINAL: No apparent ascites.  EXTREMITIES: No peripheral edema  HEMATOLOGIC: No significant bruising NEUROLOGIC: Oriented to person, place, and time. Nonfocal. Normal muscle tone.  PSYCHIATRIC: Normal mood and affect. Normal behavior. Cooperative  CARDIAC DATABASE: EKG: 11/15/2019: Normal sinus rhythm, 79 bpm, normal axis, ST depressions in the inferior and lateral leads suggestive of possible ischemia.  Prior EKG dated 07/31/2018 noted normal sinus with PVCs and nonspecific ST abnormality.  05/29/2020: Atrial fibrillation, 79 bpm, normal axis, poor  R wave progression, ST-T changes in the inferolateral leads, without underlying injury pattern.  Compared to prior EKG normal sinus rhythm has transition to atrial fibrillation.  09/03/2020: Normal sinus rhythm, 67 bpm, normal axis, first-degree AV block.  09/25/2020: Atrial fibrillation, normal axis, PRWP, without underlying injury pattern.  Echocardiogram: 11/21/2019: LVEF 60 to 65%, mild LVH, indeterminate diastolic filling pattern, elevated left atrial pressure, mild MR, mild TR.  Stress Testing: Lexiscan (Walking with mod Bruce)Tetrofosmin Stress Test 11/19/2019:  Myocardial perfusion is normal.  Overall LV systolic function is normal without regional wall motion abnormalities.   Stress LV EF: 61%.  No previous exam available for comparison. Low risk study.   Heart Catheterization: None  Procedures: 09/03/2020: Direct-current cardioversion, 200 J x 1, atrial fibrillation converted to normal sinus rhythm.  LABORATORY DATA: CBC Latest Ref Rng & Units 08/28/2020 05/30/2020 07/31/2018  WBC 3.4 - 10.8 x10E3/uL 7.1 - 6.5  Hemoglobin 11.1 - 15.9 g/dL 14.3 12.8 13.6  Hematocrit 34.0 - 46.6 % 43.3 39.7 41.4  Platelets 150 - 450 x10E3/uL 211 - 268    CMP Latest Ref Rng & Units 08/28/2020 05/30/2020 07/31/2018  Glucose 65 - 99 mg/dL 102(H) 115(H) 98  BUN 8 - 27 mg/dL 18 15 15   Creatinine 0.57 - 1.00 mg/dL 0.87 0.77 0.83  Sodium 134 - 144 mmol/L 139 135 134(L)  Potassium 3.5 - 5.2 mmol/L 4.5 3.7 3.6  Chloride 96 - 106 mmol/L 99 98 100  CO2 20 - 29 mmol/L 24 23 27   Calcium 8.7 - 10.3 mg/dL 10.4(H) 9.5 9.6   External Labs: Collected: 10/11/2019 Creatinine 0.8 mg/dL. eGFR: 69 mL/min per 1.73 m Lipid profile: Total cholesterol 169, triglycerides 54, HDL 79, LDL 79, non-HDL 90 Hemoglobin A1c: None  IMPRESSION:    ICD-10-CM   1. Persistent atrial fibrillation (HCC)  I48.19 EKG 12-Lead  2. History of cardioversion  Z98.890   3. Long term (current) use of anticoagulants  Z79.01   4. Benign hypertension  I10   5. Mixed hyperlipidemia  E78.2   6. OSA on CPAP  G47.33    Z99.89      RECOMMENDATIONS: Kelly Blackwell is a 80 y.o. female whose past medical history and cardiac risk factors include:Sleep Apnea, atrial fibrillation, hypertension, hyperlipidemia, history of breast cancer, postmenopausal female, advanced age.   Atrial fibrillation: Persistent . Rate control: Toprol-XL. Marland Kitchen Rhythm control: N/A. Marland Kitchen Thromboembolic prophylaxis: Eliquis  . CHA2DS2-VASc SCORE is 78 (age >55, HTN, female gender) which correlates to 4.0 % risk of stroke per year.  . Patient underwent direct-current cardioversion in February 2022 and converted to normal sinus after 200 J x 1.  However  today she is here for 2-week follow-up and is back in atrial fibrillation. . We discussed considering antiarrhythmic medication versus current medical therapy.  Patient has a few concerns with regards to the cost of antiarrhythmic medications, the side effects of antiarrhythmic medications and therefore would like to hold off on such interventions at this time.   . Patient understands that persistent atrial fibrillation can contribute to worsening cardiac morbidity.   Continue current medical therapy.  Benign essential hypertension:   Office blood pressure is very controlled.  Blood pressure log reviewed.  Patient's home blood pressure usually around 130/80 mmHg.  Continue current medical therapy.  Skin importance of low-salt diet.  Since last office visit she did undergo sleep evaluation and was diagnosed with obstructive sleep apnea of moderate intensity.  Is currently using CPAP machine.    Mixed hyperlipidemia:  Continue statin therapy.  Most recent lipid profile reviewed.  Currently managed by PCP.  FINAL MEDICATION LIST END OF ENCOUNTER: No orders of the defined types were placed in this encounter.   Medications Discontinued During This Encounter  Medication Reason  . silver sulfADIAZINE (SILVADENE) 1 % cream Error     Current Outpatient Medications:  .  acetaminophen (TYLENOL) 500 MG tablet, Take 500-1,000 mg by mouth every 6 (six) hours as needed (pain)., Disp: , Rfl:  .  ALPRAZolam (XANAX) 0.25 MG tablet, Take 0.25 mg by mouth See admin instructions. Take 1 tablet (0.25 mg) by mouth scheduled at night for sleep; may take an additional dose during the day if needed for anxiety, Disp: , Rfl:  .  anastrozole (ARIMIDEX) 1 MG tablet, Take 1 tablet (1 mg total) by mouth daily., Disp: 90 tablet, Rfl: 3 .  apixaban (ELIQUIS) 5 MG TABS tablet, Take 1 tablet (5 mg total) by mouth 2 (two) times daily., Disp: 180 tablet, Rfl: 1 .  atorvastatin (LIPITOR) 10 MG tablet, Take 10 mg by mouth  daily. , Disp: , Rfl:  .  Calcium Carb-Cholecalciferol (CALCIUM 600 + D PO), Take 1 tablet by mouth 2 (two) times daily., Disp: , Rfl:  .  cetirizine (ZYRTEC) 10 MG tablet, Take 10 mg by mouth daily., Disp: , Rfl:  .  cholecalciferol (VITAMIN D3) 25 MCG (1000 UT) tablet, Take 1,000 Units by mouth daily., Disp: , Rfl:  .  EPINEPHrine 0.3 mg/0.3 mL IJ SOAJ injection, Inject 0.3 mg into the muscle as needed for anaphylaxis., Disp: , Rfl:  .  methocarbamol (ROBAXIN) 500 MG tablet, Take 250 mg by mouth at bedtime. Leg cramps, Disp: , Rfl:  .  metoprolol succinate (TOPROL XL) 25 MG 24 hr tablet, Take 1 tablet (25 mg total) by mouth daily. (Patient taking differently: Take 25 mg by mouth every evening.), Disp: 90 tablet, Rfl: 1 .  nitroGLYCERIN (NITROSTAT) 0.4 MG SL tablet, DISSOLVE 1 TABLET UNDER THE TONGUE EVERY 5 MINUTES AS NEEDED FOR CHEST PAIN. IF YOU REQUIRE MORE THAN TWO TABLETS FIVE MINUTES APART GO TO THE NEAREST ER VIA EMS. (Patient taking differently: Place 0.4 mg under the tongue every 5 (five) minutes x 3 doses as needed for chest pain.), Disp: 100 tablet, Rfl: 1 .  omeprazole (PRILOSEC) 20 MG capsule, Take 20 mg by mouth daily., Disp: , Rfl:  .  telmisartan-hydrochlorothiazide (MICARDIS HCT) 80-12.5 MG tablet, Take 1 tablet by mouth daily., Disp: , Rfl:   Orders Placed This Encounter  Procedures  . EKG 12-Lead   --Continue cardiac medications as reconciled in final medication list. --Return in about 6 months (around 03/28/2021) for Follow up, A. fib. Or sooner if needed. --Continue follow-up with your primary care physician regarding the management of your other chronic comorbid conditions.  Patient's questions and concerns were addressed to her satisfaction. She voices understanding of the instructions provided during this encounter.   This note was created using a voice recognition software as a result there may be grammatical errors inadvertently enclosed that do not reflect the nature  of this encounter. Every attempt is made to correct such errors.  Rex Kras, [place], [facility]  Pager: [phone] Office: [phone]

## 2020-09-29 DIAGNOSIS — G4733 Obstructive sleep apnea (adult) (pediatric): Secondary | ICD-10-CM | POA: Diagnosis not present

## 2020-10-14 DIAGNOSIS — E785 Hyperlipidemia, unspecified: Secondary | ICD-10-CM | POA: Diagnosis not present

## 2020-10-14 DIAGNOSIS — E559 Vitamin D deficiency, unspecified: Secondary | ICD-10-CM | POA: Diagnosis not present

## 2020-10-20 ENCOUNTER — Telehealth: Payer: Self-pay | Admitting: Adult Health

## 2020-10-20 DIAGNOSIS — G4739 Other sleep apnea: Secondary | ICD-10-CM

## 2020-10-20 DIAGNOSIS — G4733 Obstructive sleep apnea (adult) (pediatric): Secondary | ICD-10-CM

## 2020-10-20 NOTE — Telephone Encounter (Signed)
Pt LVM, have a question about my CPAP machine, whether it can be lessened. Would like a call from the nurse.

## 2020-10-21 DIAGNOSIS — Z1212 Encounter for screening for malignant neoplasm of rectum: Secondary | ICD-10-CM | POA: Diagnosis not present

## 2020-10-21 DIAGNOSIS — M8580 Other specified disorders of bone density and structure, unspecified site: Secondary | ICD-10-CM | POA: Diagnosis not present

## 2020-10-21 DIAGNOSIS — J45909 Unspecified asthma, uncomplicated: Secondary | ICD-10-CM | POA: Diagnosis not present

## 2020-10-21 DIAGNOSIS — Z0000 Encounter for general adult medical examination without abnormal findings: Secondary | ICD-10-CM | POA: Diagnosis not present

## 2020-10-21 DIAGNOSIS — G4733 Obstructive sleep apnea (adult) (pediatric): Secondary | ICD-10-CM | POA: Diagnosis not present

## 2020-10-21 DIAGNOSIS — I4819 Other persistent atrial fibrillation: Secondary | ICD-10-CM | POA: Diagnosis not present

## 2020-10-21 DIAGNOSIS — E559 Vitamin D deficiency, unspecified: Secondary | ICD-10-CM | POA: Diagnosis not present

## 2020-10-21 DIAGNOSIS — R82998 Other abnormal findings in urine: Secondary | ICD-10-CM | POA: Diagnosis not present

## 2020-10-21 DIAGNOSIS — Z91018 Allergy to other foods: Secondary | ICD-10-CM | POA: Diagnosis not present

## 2020-10-21 DIAGNOSIS — E785 Hyperlipidemia, unspecified: Secondary | ICD-10-CM | POA: Diagnosis not present

## 2020-10-21 DIAGNOSIS — H04129 Dry eye syndrome of unspecified lacrimal gland: Secondary | ICD-10-CM | POA: Diagnosis not present

## 2020-10-21 DIAGNOSIS — F419 Anxiety disorder, unspecified: Secondary | ICD-10-CM | POA: Diagnosis not present

## 2020-10-21 NOTE — Telephone Encounter (Signed)
Called the pt to get more information on why the pressure needs to be adjusted. There was no answer LVM for pt to call back or send a mychart message.   Patient returns call 9 AM to take it, please ask the patient what concerns she is having with the machine and why she feels the pressures need to be changed.  Ask if she feels like she has too much pressure or not enough.

## 2020-10-21 NOTE — Addendum Note (Signed)
Addended by: Darleen Crocker on: 10/21/2020 04:47 PM   Modules accepted: Orders

## 2020-10-21 NOTE — Telephone Encounter (Signed)
Called the patient back and advised that Dr. Brett Fairy reviewed her download.  Informed her that she is having apnea events despite using her CPAP.  Her AHI was 18.3 in the last 30 days of data.  Dr. Brett Fairy would like to bring the patient in for a BiPAP titration.  Advised I would send this information to sleep lab for them to process through insurance.  Informed the patient that they will contact once they get insurance authorization to get her scheduled.  Advised the patient with Medicare insurance I am unsure if she would need a follow-up office visit prior to this BiPAP titration since it has been over 6 months since she has been seen in the office.  Informed the patient that we would call her back to get her scheduled if that was necessary.  At this time patient has multiple doctors appointments for this month and would like to hold off scheduling anything until the end of the month or in May.  She was appreciative for the call back

## 2020-10-21 NOTE — Telephone Encounter (Signed)
I will have Dr Dohmeier  review the pt's cpap download, looks like she is having increase in apnea events which is probably why pressure is increasing.

## 2020-10-21 NOTE — Telephone Encounter (Signed)
Pt has called to inform that she is getting too much air.  Pt states sometimes the pressure gets up to 16.  Please call.

## 2020-10-22 DIAGNOSIS — D225 Melanocytic nevi of trunk: Secondary | ICD-10-CM | POA: Diagnosis not present

## 2020-10-22 DIAGNOSIS — L821 Other seborrheic keratosis: Secondary | ICD-10-CM | POA: Diagnosis not present

## 2020-10-22 DIAGNOSIS — L817 Pigmented purpuric dermatosis: Secondary | ICD-10-CM | POA: Diagnosis not present

## 2020-10-22 DIAGNOSIS — D1801 Hemangioma of skin and subcutaneous tissue: Secondary | ICD-10-CM | POA: Diagnosis not present

## 2020-10-22 DIAGNOSIS — Z85828 Personal history of other malignant neoplasm of skin: Secondary | ICD-10-CM | POA: Diagnosis not present

## 2020-10-22 DIAGNOSIS — D224 Melanocytic nevi of scalp and neck: Secondary | ICD-10-CM | POA: Diagnosis not present

## 2020-10-22 DIAGNOSIS — L298 Other pruritus: Secondary | ICD-10-CM | POA: Diagnosis not present

## 2020-10-22 DIAGNOSIS — L814 Other melanin hyperpigmentation: Secondary | ICD-10-CM | POA: Diagnosis not present

## 2020-10-30 DIAGNOSIS — M8580 Other specified disorders of bone density and structure, unspecified site: Secondary | ICD-10-CM | POA: Diagnosis not present

## 2020-10-30 DIAGNOSIS — Z01419 Encounter for gynecological examination (general) (routine) without abnormal findings: Secondary | ICD-10-CM | POA: Diagnosis not present

## 2020-10-30 DIAGNOSIS — Z01411 Encounter for gynecological examination (general) (routine) with abnormal findings: Secondary | ICD-10-CM | POA: Diagnosis not present

## 2020-10-30 DIAGNOSIS — Z124 Encounter for screening for malignant neoplasm of cervix: Secondary | ICD-10-CM | POA: Diagnosis not present

## 2020-10-30 DIAGNOSIS — Z853 Personal history of malignant neoplasm of breast: Secondary | ICD-10-CM | POA: Diagnosis not present

## 2020-10-30 DIAGNOSIS — G4733 Obstructive sleep apnea (adult) (pediatric): Secondary | ICD-10-CM | POA: Diagnosis not present

## 2020-10-30 DIAGNOSIS — Z6823 Body mass index (BMI) 23.0-23.9, adult: Secondary | ICD-10-CM | POA: Diagnosis not present

## 2020-11-05 ENCOUNTER — Telehealth: Payer: Self-pay | Admitting: Adult Health

## 2020-11-05 NOTE — Telephone Encounter (Signed)
Pt states she received notification from her insurance company that she has been approved for a sleep study, pt would like a call to discuss

## 2020-11-05 NOTE — Telephone Encounter (Signed)
I have not rec'd any information from pt's insurance yet of authorization. As soon as I do, I will be giving the pt a call back to schedule at that time.

## 2020-11-11 NOTE — Telephone Encounter (Signed)
Patient left a voicemail asking for a call to schedule her sleep study.  She received insurance authorization for a sleep study in the mail until January 20, 2021.  She would like this to be completed before this expires.

## 2020-11-15 ENCOUNTER — Other Ambulatory Visit: Payer: Self-pay | Admitting: Cardiology

## 2020-11-15 DIAGNOSIS — I4891 Unspecified atrial fibrillation: Secondary | ICD-10-CM

## 2020-11-18 NOTE — Telephone Encounter (Signed)
Finally have rec'd auth info for sleep study. Pt is scheduled on 12/17/2020 at 9pm

## 2020-11-29 DIAGNOSIS — G4733 Obstructive sleep apnea (adult) (pediatric): Secondary | ICD-10-CM | POA: Diagnosis not present

## 2020-12-05 DIAGNOSIS — G4733 Obstructive sleep apnea (adult) (pediatric): Secondary | ICD-10-CM | POA: Diagnosis not present

## 2020-12-08 ENCOUNTER — Other Ambulatory Visit: Payer: Self-pay

## 2020-12-08 DIAGNOSIS — I4891 Unspecified atrial fibrillation: Secondary | ICD-10-CM

## 2020-12-08 MED ORDER — APIXABAN 5 MG PO TABS: 5.0000 mg | ORAL_TABLET | Freq: Two times a day (BID) | ORAL | 1 refills | Status: DC

## 2020-12-17 ENCOUNTER — Ambulatory Visit (INDEPENDENT_AMBULATORY_CARE_PROVIDER_SITE_OTHER): Payer: PPO | Admitting: Neurology

## 2020-12-17 DIAGNOSIS — G4733 Obstructive sleep apnea (adult) (pediatric): Secondary | ICD-10-CM | POA: Diagnosis not present

## 2020-12-17 DIAGNOSIS — G4739 Other sleep apnea: Secondary | ICD-10-CM

## 2020-12-17 DIAGNOSIS — I4819 Other persistent atrial fibrillation: Secondary | ICD-10-CM

## 2020-12-17 DIAGNOSIS — G4701 Insomnia due to medical condition: Secondary | ICD-10-CM

## 2020-12-17 DIAGNOSIS — I208 Other forms of angina pectoris: Secondary | ICD-10-CM

## 2020-12-17 DIAGNOSIS — I10 Essential (primary) hypertension: Secondary | ICD-10-CM

## 2020-12-26 DIAGNOSIS — G4739 Other sleep apnea: Secondary | ICD-10-CM | POA: Insufficient documentation

## 2020-12-26 NOTE — Progress Notes (Signed)
Happy Birthday, Mrs Hiltunen.  DIAGNOSIS 1. Based on a HST in 2021 the patient diagnosis was Obstructive Sleep Apnea at an AHI of 22/h, and insomnia. 2. The patient recently developed very high residual AHI and was asked to return for a PAP titration, here no evidence of any apnea was present even at lowest settings of CPAP, the patient had by far more spontaneous arousals and finally a few central apneas arose.   3. BiPAP was not needed. 4. EKG was in atrial fib.  PLANS/RECOMMENDATIONS: The patient did best at 7 and 8 cm water pressure, but was not able to tolerate any of the masks well. She reported air leaks which were not measurable , and may have been related to air flow through the normal vent points of any CPAP interface. I like to offer resetting her current CPAP to 4-8 cm water without EPR, a ResMed interface of her choice, and a sleep aid (Trazodone) for prn use. The patient was fitted with a ResMed F 30 i mask in small during this titration.  If this is not successful, we may need another baseline sleep study to recalibrate if treatment remains necessary at all.

## 2020-12-26 NOTE — Addendum Note (Signed)
Addended by: Larey Seat on: 12/26/2020 02:12 PM   Modules accepted: Orders

## 2020-12-26 NOTE — Procedures (Signed)
PATIENT'S NAME:  Kelly Blackwell, Kelly Blackwell DOB:      1940/12/08      MR#:    [id number]     DATE OF RECORDING: 12/17/2020 REFERRING M.D.:  Kelly Kras, DO and Kelly Battles, MD Study Performed:   Titration to positive airway pressure, CPAP/BiPAP  HISTORY:  Kelly Blackwell is a 80 year old female with a history of atrial fibrillation, bradycardia, breast cancer and hypertension, and was diagnosed with obstructive sleep apnea ,following a  HST on 02-11-2020 which  resulted in a diagnosis of Moderate sleep apnea at AHI of 22.7/h , with some accentuation in REM sleep. REM AHI was 27.2/h. there is indication of loud snoring, based on high RDI score. No significant positional component, no hypoxemia. Overall sleep time recorded was just above 3 hours. no central apnea was present. In her 2021 visit with Kelly Givens, NP her auto-CPAP download indicated that she used her machine nightly for compliance of 100% greater than 4 hours each night.  Her residual AHI was then only 4.7/h on 6 to 16 cm of water. Her 95% pressure was not stated.   Last data for CPAP compliance were again obtained after a phone conversation on 10-20-2020 : She had now a residual AHI of 18.3/h and was asked to come back for a BiPAP titration should CPAP not work optimal for her.    The patient endorsed the Epworth Sleepiness Scale at 4 points.   The patient's weight 122 pounds with a height of 61 (inches), resulting in a BMI of 22.9 kg/m2. The patient's neck circumference measured 13.5 inches.  CURRENT MEDICATIONS: Tylenol, Xanax, Arimidex, Eliquis, Lipitor, Calcium, Zyrtec, Vitamin D3, Epinephrine, Robaxin, Toprol XL, Nitrostat, Prilosec, Micardis HCT    PROCEDURE:  This is a multichannel digital polysomnogram utilizing the SomnoStar 11.2 system.  Electrodes and sensors were applied and monitored per AASM Specifications.   EEG, EOG, Chin and Limb EMG, were sampled at 200 Hz.  ECG, Snore and Nasal Pressure, Thermal Airflow, Respiratory Effort, CPAP  Flow and Pressure, Oximetry was sampled at 50 Hz. Digital video and audio were recorded.      CPAP was initiated at first under her nasal pillow, then changed to a FFM ResMed F 30i -in small size, and a medium size was also tried. There was a prolonged sleep latency- almost 2 hours.  The pressure began at a setting of 6 cmH20 with heated humidity per AASM standards and was advanced to 7,8,9,10 and finally 11 cmH20.  At a PAP pressure of only 8 cmH20, there was a reduction of the AHI to 0.0/h with 28.5 minutes of sleep. There was improvement of sleep apnea at all settings, but sleep efficiency (an indicator for tolerance of CPAP) was highest at 7 and 8 cm water.  Lights Out was at 21:59 and Lights On at 05:36. Total recording time (TRT) was 449.5 minutes, with a total sleep time (TST) of 246.5 minutes. The patient's sleep latency was 109.5 minutes. REM latency was 140.5 minutes.  The sleep efficiency was 54.8 %.    SLEEP ARCHITECTURE: WASO (Wake after sleep onset) was 137.5 minutes.  There were 51.5 minutes in Stage N1, 137.5 minutes Stage N2, 22 minutes Stage N3 and 35.5 minutes in Stage REM.  The percentage of Stage N1 was 20.9%, Stage N2 was 55.8%, Stage N3 was 8.9% and Stage R (REM sleep) was 14.4%.    RESPIRATORY ANALYSIS:  There was a total of 4 respiratory events: 1 obstructive apnea, 3 central apneas and 0 hypopneas  with a hypopnea index of 0/hour. The patient also had 0 respiratory event related arousals (RERAs).    The total APNEA/HYPOPNEA INDEX  (AHI) was 1.0 /hour .  0 events occurred in REM sleep and 4 events in NREM. The REM AHI was 0 /hour versus a non-REM AHI of 1.1 /hour.  The patient spent 9 minutes of total sleep time in the supine position and 238 minutes in non-supine. The supine AHI was 0.0, versus a non-supine AHI of 1.0.  OXYGEN SATURATION & C02:  The baseline 02 saturation was 95%, with the lowest being 92%. Time spent below 89% saturation equaled 0 minutes. The arousals were  noted as: 28 were spontaneous, 0 were associated with PLMs, 0 were associated with respiratory events. The patient had a total of 0 Periodic Limb Movements.  EKG was highly arrhythmic, and bradycardia resulted form long intervals between heart beats, atrial fibrillation related.  The patient was fitted with a ResMed F 30 i mask in small and medium, no air leaks were present.  DIAGNOSIS Based on a HST in 2021 the patient diagnosis was Obstructive Sleep Apnea at an AHI of 22/h, and insomnia. The patient recently developed very high residual AHI and was asked to return for a PAP titration, here no evidence of any apnea was present even at lowest settings of CPAP, the patient had by far more spontaneous arousals and finally a few central apneas arose.   BiPAP was not needed.  EKG was in atrial fib.   PLANS/RECOMMENDATIONS: The patient did best at 7 and 8 cm water pressure, but was not able to tolerate any of the masks well. She reported air leaks which were not measurable , and may have been related to air flow through the normal vent points of any CPAP interface.  I like to offer resetting her current CPAP to 4-8 cm water without EPR, a ResMed interface of her choice, and a sleep aid (Trazodone) for prn use. The patient was fitted with a ResMed F 30 i mask in small during this titration.   If this is not successful, we may need another baseline sleep study to recalibrate if treatment remains necessary at all.    DISCUSSION:A follow up appointment will be scheduled with Kelly Givens, NP ,  in the Sleep Clinic at [facility].   Please call [phone] with any questions.    I certify that I have reviewed the entire raw data recording prior to the issuance of this report in accordance with the Standards of Accreditation of the American Academy of Sleep Medicine (AASM)   Larey Seat, M.D. Diplomat, Tax adviser of Psychiatry and Neurology  Diplomat, Tax adviser of Sleep  Medicine Market researcher, [employer] [facility] at [employer]

## 2020-12-29 ENCOUNTER — Encounter: Payer: Self-pay | Admitting: Neurology

## 2020-12-29 ENCOUNTER — Telehealth: Payer: Self-pay | Admitting: Neurology

## 2020-12-29 NOTE — Telephone Encounter (Signed)
Called patient to discuss sleep study results. No answer at this time. LVM for the patient to call back.  Mychart message was also sent.

## 2020-12-29 NOTE — Telephone Encounter (Signed)
-----   Message from Larey Seat, MD sent at 12/26/2020  2:12 PM EDT ----- [name], [name].  DIAGNOSIS 1. Based on a HST in 2021 the patient diagnosis was Obstructive Sleep Apnea at an AHI of 22/h, and insomnia. 2. The patient recently developed very high residual AHI and was asked to return for a PAP titration, here no evidence of any apnea was present even at lowest settings of CPAP, the patient had by far more spontaneous arousals and finally a few central apneas arose.   3. BiPAP was not needed. 4. EKG was in atrial fib.  PLANS/RECOMMENDATIONS: The patient did best at 7 and 8 cm water pressure, but was not able to tolerate any of the masks well. She reported air leaks which were not measurable , and may have been related to air flow through the normal vent points of any CPAP interface. I like to offer resetting her current CPAP to 4-8 cm water without EPR, a ResMed interface of her choice, and a sleep aid (Trazodone) for prn use. The patient was fitted with a ResMed F 30 i mask in small during this titration.  If this is not successful, we may need another baseline sleep study to recalibrate if treatment remains necessary at all.

## 2020-12-30 DIAGNOSIS — G4733 Obstructive sleep apnea (adult) (pediatric): Secondary | ICD-10-CM | POA: Diagnosis not present

## 2020-12-30 NOTE — Telephone Encounter (Signed)
Called patient to discuss sleep study results. No answer at this time. LVM for the patient to call back.   ** If pt calls back, she did reply to [employer] and seemed to understand information provided. I left a message advising the updated order was sent to [facility]). Pt would contact them to get mask refitting completed if needed. If patient has questions about anything I can call her back otherwise everything has been sent to the company.

## 2020-12-30 NOTE — Telephone Encounter (Signed)
Pt returned RN's call and LVM this morning to be called back when available.

## 2021-01-02 ENCOUNTER — Ambulatory Visit
Admission: RE | Admit: 2021-01-02 | Discharge: 2021-01-02 | Disposition: A | Discharge status: Home / Self Care | Payer: PPO | Source: Ambulatory Visit | Attending: Hematology and Oncology | Admitting: Hematology and Oncology

## 2021-01-02 ENCOUNTER — Other Ambulatory Visit: Payer: Self-pay

## 2021-01-02 DIAGNOSIS — Z780 Asymptomatic menopausal state: Secondary | ICD-10-CM | POA: Diagnosis not present

## 2021-01-02 DIAGNOSIS — M8580 Other specified disorders of bone density and structure, unspecified site: Secondary | ICD-10-CM

## 2021-01-02 DIAGNOSIS — M8589 Other specified disorders of bone density and structure, multiple sites: Secondary | ICD-10-CM | POA: Diagnosis not present

## 2021-01-29 DIAGNOSIS — G4733 Obstructive sleep apnea (adult) (pediatric): Secondary | ICD-10-CM | POA: Diagnosis not present

## 2021-03-01 DIAGNOSIS — G4733 Obstructive sleep apnea (adult) (pediatric): Secondary | ICD-10-CM | POA: Diagnosis not present

## 2021-03-06 DIAGNOSIS — C50211 Malignant neoplasm of upper-inner quadrant of right female breast: Secondary | ICD-10-CM | POA: Diagnosis not present

## 2021-03-06 DIAGNOSIS — Z170 Estrogen receptor positive status [ER+]: Secondary | ICD-10-CM | POA: Diagnosis not present

## 2021-03-31 ENCOUNTER — Encounter: Payer: Self-pay | Admitting: Cardiology

## 2021-03-31 ENCOUNTER — Ambulatory Visit: Payer: PPO | Admitting: Cardiology

## 2021-03-31 ENCOUNTER — Other Ambulatory Visit: Payer: Self-pay

## 2021-03-31 VITALS — BP 144/77 | HR 69 | Temp 97.6°F | Resp 16 | Ht 61.0 in | Wt 127.8 lb

## 2021-03-31 DIAGNOSIS — I4819 Other persistent atrial fibrillation: Secondary | ICD-10-CM

## 2021-03-31 DIAGNOSIS — Z9989 Dependence on other enabling machines and devices: Secondary | ICD-10-CM

## 2021-03-31 DIAGNOSIS — E782 Mixed hyperlipidemia: Secondary | ICD-10-CM | POA: Diagnosis not present

## 2021-03-31 DIAGNOSIS — Z9889 Other specified postprocedural states: Secondary | ICD-10-CM | POA: Diagnosis not present

## 2021-03-31 DIAGNOSIS — Z7901 Long term (current) use of anticoagulants: Secondary | ICD-10-CM | POA: Diagnosis not present

## 2021-03-31 DIAGNOSIS — I10 Essential (primary) hypertension: Secondary | ICD-10-CM

## 2021-03-31 DIAGNOSIS — G4733 Obstructive sleep apnea (adult) (pediatric): Secondary | ICD-10-CM | POA: Diagnosis not present

## 2021-03-31 NOTE — Progress Notes (Signed)
Kelly Blackwell Date of Birth: 06-05-1941 MRN: [id number] Cardiologist:  Rex Kras, DO, [facility] (established care 11/15/2019) Former Cardiology Providers: Dr. Adrian Prows  Date: 03/31/21 Last Office Visit: 09/25/2020   Chief Complaint  Patient presents with   Persistent atrial fibrillation    Follow-up   HPI  Kelly Blackwell is a 80 y.o. female who presents to the office with a  chief complaint of " 85-monthfollow-up for atrial fibrillation management.."   Her past medical history and cardiovascular risk factors are: Sleep Apnea, atrial fibrillation, hypertension, hyperlipidemia, history of breast cancer, postmenopausal female, advanced age.   Patient was originally referred to the office for evaluation of chest pain at the request of her primary care provider back in April 2021.   In November 2020 she was noted to be in atrial fibrillation on surface ECG and the shared decision was to proceed with rate control strategy.  Despite being on AV nodal blocking agents she did not spontaneously convert to normal sinus rhythm.  Due to her symptoms of shortness of breath and generalized tired and fatigued shared decision was to proceed with direct-current cardioversion in February 2022.  However, on the 2-week follow-up visit patient was back in atrial fibrillation and we discussed considering antiarrhythmic medications versus EP evaluation for possible atrial fibrillation ablation.  However, the shared decision at that time was to continue with the current medical therapy with regards to rate control strategy and thromboembolic prophylaxis.  She presents now for 642-monthollow-up.  Clinically she remains stable and her atrial fibrillation is well controlled.  She has episodes of feeling tired or short of breath but this is very short-lived.  No hospitalizations or urgent care visits since last office encounter back in March 2022.  No history of premature coronary artery disease in the family or sudden  cardiac death.  FUNCTIONAL STATUS: She walks 5 days a week for 30-45 minutes.    ALLERGIES: Allergies  Allergen Reactions   Beef Allergy Diarrhea and Nausea And Vomiting   Sulfa Antibiotics Other (See Comments)    Fever and rash     MEDICATION LIST PRIOR TO VISIT: Current Meds  Medication Sig   acetaminophen (TYLENOL) 500 MG tablet Take 500-1,000 mg by mouth every 6 (six) hours as needed (pain).   ALPRAZolam (XANAX) 0.25 MG tablet Take 0.25 mg by mouth See admin instructions. Take 1 tablet (0.25 mg) by mouth scheduled at night for sleep; may take an additional dose during the day if needed for anxiety   anastrozole (ARIMIDEX) 1 MG tablet Take 1 tablet (1 mg total) by mouth daily.   apixaban (ELIQUIS) 5 MG TABS tablet Take 1 tablet (5 mg total) by mouth 2 (two) times daily.   atorvastatin (LIPITOR) 10 MG tablet Take 10 mg by mouth daily.    Calcium Carb-Cholecalciferol (CALCIUM 600 + D PO) Take 1 tablet by mouth 2 (two) times daily.   cetirizine (ZYRTEC) 10 MG tablet Take 10 mg by mouth daily.   cholecalciferol (VITAMIN D3) 25 MCG (1000 UT) tablet Take 1,000 Units by mouth daily.   EPINEPHrine 0.3 mg/0.3 mL IJ SOAJ injection Inject 0.3 mg into the muscle as needed for anaphylaxis.   methocarbamol (ROBAXIN) 500 MG tablet Take 250 mg by mouth at bedtime. Leg cramps   metoprolol succinate (TOPROL-XL) 25 MG 24 hr tablet TAKE 1 TABLET BY MOUTH EVERY DAY   nitroGLYCERIN (NITROSTAT) 0.4 MG SL tablet DISSOLVE 1 TABLET UNDER THE TONGUE EVERY 5 MINUTES AS NEEDED FOR  CHEST PAIN. IF YOU REQUIRE MORE THAN TWO TABLETS FIVE MINUTES APART GO TO THE NEAREST ER VIA EMS. (Patient taking differently: Place 0.4 mg under the tongue every 5 (five) minutes x 3 doses as needed for chest pain.)   omeprazole (PRILOSEC) 20 MG capsule Take 20 mg by mouth daily.   telmisartan-hydrochlorothiazide (MICARDIS HCT) 80-12.5 MG tablet Take 1 tablet by mouth daily.     PAST MEDICAL HISTORY: Past Medical History:   Diagnosis Date   Allergy    seasonal allergies.    Anxiety    Arthritis    Neck   Atrial fibrillation ([place])    Breast cancer (HCC)    Hypercholesteremia    Hypertension    OSA on CPAP 12/2019   Osteopenia    Personal history of radiation therapy     PAST SURGICAL HISTORY: Past Surgical History:  Procedure Laterality Date   APPENDECTOMY     age 30   BREAST LUMPECTOMY Right 08/03/2018   BREAST LUMPECTOMY WITH RADIOACTIVE SEED AND SENTINEL LYMPH NODE BIOPSY Right 08/03/2018   Procedure: RIGHT BREAST LUMPECTOMY WITH RADIOACTIVE SEED AND SENTINEL LYMPH NODE BIOPSY WITH BLUE DYE INJECTION;  Surgeon: Donnie Mesa, MD;  Location: [facility];  Service: General;  Laterality: Right;   bunionectmy     1986   CARDIOVERSION N/A 09/03/2020   Procedure: CARDIOVERSION;  Surgeon: Rex Kras, DO;  Location: [facility];  Service: Cardiovascular;  Laterality: N/A;   CHOLECYSTECTOMY     age 48   TONSILLECTOMY      FAMILY HISTORY: The patient family history includes AAA (abdominal aortic aneurysm) in her father; Atrial fibrillation in her mother; Colon cancer in her father; Coronary artery disease in her father; Heart failure in her mother; Hypertension in her mother; Osteoporosis in her mother.  SOCIAL HISTORY:  The patient  reports that she has never smoked. She has never used smokeless tobacco. She reports that she does not currently use alcohol. She reports that she does not use drugs.  REVIEW OF SYSTEMS: Review of Systems  Constitutional: Negative for chills and fever.  HENT:  Negative for hoarse voice and nosebleeds.   Eyes:  Negative for discharge, double vision and pain.  Cardiovascular:  Negative for chest pain, claudication, dyspnea on exertion, leg swelling, near-syncope, orthopnea, palpitations, paroxysmal nocturnal dyspnea and syncope.  Respiratory:  Negative for hemoptysis and shortness of breath.   Musculoskeletal:  Negative for muscle cramps and myalgias.  Gastrointestinal:   Negative for abdominal pain, constipation, diarrhea, hematemesis, hematochezia, melena, nausea and vomiting.  Neurological:  Negative for dizziness and light-headedness.  PHYSICAL EXAM: Vitals with BMI 03/31/2021 09/25/2020 09/03/2020  Height 5' 1"  5' 1"  -  Weight 127 lbs 13 oz 126 lbs 10 oz -  BMI 02.63 78.58 -  Systolic 850 277 412  Diastolic 77 78 75  Pulse 69 61 61   CONSTITUTIONAL: Well-developed and well-nourished. No acute distress.  SKIN: Skin is warm and dry. No rash noted. No cyanosis. No pallor. No jaundice HEAD: Normocephalic and atraumatic.  EYES: No scleral icterus MOUTH/THROAT: Moist oral membranes.  NECK: No JVD present. No thyromegaly noted. No carotid bruits  LYMPHATIC: No visible cervical adenopathy.  CHEST Normal respiratory effort. No intercostal retractions  LUNGS: Clear to auscultation bilaterally.  No stridor. No wheezes. No rales.  CARDIOVASCULAR: Irregularly irregular, variable S1-S2, no murmurs rubs or gallops appreciated ABDOMINAL: No apparent ascites.  EXTREMITIES: No peripheral edema, warm to touch.  HEMATOLOGIC: No significant bruising NEUROLOGIC: Oriented to person, place, and time.  Nonfocal. Normal muscle tone.  PSYCHIATRIC: Normal mood and affect. Normal behavior. Cooperative  CARDIAC DATABASE: EKG: 11/15/2019: Normal sinus rhythm, 79 bpm, normal axis, ST depressions in the inferior and lateral leads suggestive of possible ischemia.  Prior EKG dated 07/31/2018 noted normal sinus with PVCs and nonspecific ST abnormality.  09/03/2020: Normal sinus rhythm, 67 bpm, normal axis, first-degree AV block.  09/25/2020: Atrial fibrillation, normal axis, PRWP, without underlying injury pattern.  03/31/2021: Atrial fibrillation, 56 bpm, normal axis, without underlying injury pattern.  Echocardiogram: 11/21/2019: LVEF 60 to 65%, mild LVH, indeterminate diastolic filling pattern, elevated LAP, mild MR, mild TR.  Stress Testing: Lexiscan (Walking with mod  Bruce)Tetrofosmin Stress Test  11/19/2019:  Myocardial perfusion is normal.  Overall LV systolic function is normal without regional wall motion abnormalities.  Stress LV EF: 61%.  No previous exam available for comparison. Low risk study.   Heart Catheterization: None  Procedures: 09/03/2020: Direct-current cardioversion, 200 J x 1, atrial fibrillation converted to normal sinus rhythm.  LABORATORY DATA: CBC Latest Ref Rng & Units 08/28/2020 05/30/2020 07/31/2018  WBC 3.4 - 10.8 x10E3/uL 7.1 - 6.5  Hemoglobin 11.1 - 15.9 g/dL 14.3 12.8 13.6  Hematocrit 34.0 - 46.6 % 43.3 39.7 41.4  Platelets 150 - 450 x10E3/uL 211 - 268    CMP Latest Ref Rng & Units 08/28/2020 05/30/2020 07/31/2018  Glucose 65 - 99 mg/dL 102(H) 115(H) 98  BUN 8 - 27 mg/dL 18 15 15   Creatinine 0.57 - 1.00 mg/dL 0.87 0.77 0.83  Sodium 134 - 144 mmol/L 139 135 134(L)  Potassium 3.5 - 5.2 mmol/L 4.5 3.7 3.6  Chloride 96 - 106 mmol/L 99 98 100  CO2 20 - 29 mmol/L 24 23 27   Calcium 8.7 - 10.3 mg/dL 10.4(H) 9.5 9.6   External Labs: Collected: 10/11/2019 Creatinine 0.8 mg/dL. eGFR: 69 mL/min per 1.73 m Lipid profile: Total cholesterol 169, triglycerides 54, HDL 79, LDL 79, non-HDL 90 Hemoglobin A1c: None  IMPRESSION:    ICD-10-CM   1. Persistent atrial fibrillation (HCC)  I48.19 EKG 12-Lead    2. History of cardioversion  Z98.890     3. Long term (current) use of anticoagulants  Z79.01     4. Benign hypertension  I10     5. Mixed hyperlipidemia  E78.2     6. OSA on CPAP  G47.33    Z99.89        RECOMMENDATIONS: Kelly Blackwell is a 80 y.o. female whose past medical history and cardiac risk factors include:Sleep Apnea, atrial fibrillation, hypertension, hyperlipidemia, history of breast cancer, postmenopausal female, advanced age.   Atrial fibrillation: Persistent Rate control: Toprol-XL. Rhythm control: N/A. Thromboembolic prophylaxis: Eliquis  CHA2DS2-VASc SCORE is 60 (age >46, HTN, female gender)  which correlates to 4.0 % risk of stroke per year.  Last direct-current cardioversion in February 2022 and converted to normal sinus after 200 J x 1 but shortly thereafter went back into atrial fibrillation.  We have discussed initiating antiarrhythmic medications versus seeing electrophysiology for possible radiofrequency ablation.  However, patient would like to hold off on additional therapies at this time.  Long-term oral anticoagulation: Indication: Atrial fibrillation. Does not endorse any evidence of bleeding. Check a BMP and hemoglobin and hematocrit as she is on anticoagulation.  Benign essential hypertension:  Office blood pressure is very controlled. Blood pressure log reviewed.  Patient's home blood pressure usually around 135/80 mmHg. Continue current medical therapy. Skin importance of low-salt diet. Since last office visit she did undergo sleep evaluation and  was diagnosed with obstructive sleep apnea of moderate intensity.  Is currently using CPAP machine.    Mixed hyperlipidemia: Continue statin therapy.  Most recent lipid profile reviewed.  Currently managed by PCP.  FINAL MEDICATION LIST END OF ENCOUNTER: No orders of the defined types were placed in this encounter.   There are no discontinued medications.    Current Outpatient Medications:    acetaminophen (TYLENOL) 500 MG tablet, Take 500-1,000 mg by mouth every 6 (six) hours as needed (pain)., Disp: , Rfl:    ALPRAZolam (XANAX) 0.25 MG tablet, Take 0.25 mg by mouth See admin instructions. Take 1 tablet (0.25 mg) by mouth scheduled at night for sleep; may take an additional dose during the day if needed for anxiety, Disp: , Rfl:    anastrozole (ARIMIDEX) 1 MG tablet, Take 1 tablet (1 mg total) by mouth daily., Disp: 90 tablet, Rfl: 3   apixaban (ELIQUIS) 5 MG TABS tablet, Take 1 tablet (5 mg total) by mouth 2 (two) times daily., Disp: 180 tablet, Rfl: 1   atorvastatin (LIPITOR) 10 MG tablet, Take 10 mg by mouth  daily. , Disp: , Rfl:    Calcium Carb-Cholecalciferol (CALCIUM 600 + D PO), Take 1 tablet by mouth 2 (two) times daily., Disp: , Rfl:    cetirizine (ZYRTEC) 10 MG tablet, Take 10 mg by mouth daily., Disp: , Rfl:    cholecalciferol (VITAMIN D3) 25 MCG (1000 UT) tablet, Take 1,000 Units by mouth daily., Disp: , Rfl:    EPINEPHrine 0.3 mg/0.3 mL IJ SOAJ injection, Inject 0.3 mg into the muscle as needed for anaphylaxis., Disp: , Rfl:    methocarbamol (ROBAXIN) 500 MG tablet, Take 250 mg by mouth at bedtime. Leg cramps, Disp: , Rfl:    metoprolol succinate (TOPROL-XL) 25 MG 24 hr tablet, TAKE 1 TABLET BY MOUTH EVERY DAY, Disp: 90 tablet, Rfl: 1   nitroGLYCERIN (NITROSTAT) 0.4 MG SL tablet, DISSOLVE 1 TABLET UNDER THE TONGUE EVERY 5 MINUTES AS NEEDED FOR CHEST PAIN. IF YOU REQUIRE MORE THAN TWO TABLETS FIVE MINUTES APART GO TO THE NEAREST ER VIA EMS. (Patient taking differently: Place 0.4 mg under the tongue every 5 (five) minutes x 3 doses as needed for chest pain.), Disp: 100 tablet, Rfl: 1   omeprazole (PRILOSEC) 20 MG capsule, Take 20 mg by mouth daily., Disp: , Rfl:    telmisartan-hydrochlorothiazide (MICARDIS HCT) 80-12.5 MG tablet, Take 1 tablet by mouth daily., Disp: , Rfl:   Orders Placed This Encounter  Procedures   EKG 12-Lead   --Continue cardiac medications as reconciled in final medication list. --Return in about 1 year (around 03/31/2022) for Follow up, A. fib. Or sooner if needed. --Continue follow-up with your primary care physician regarding the management of your other chronic comorbid conditions.  Patient's questions and concerns were addressed to her satisfaction. She voices understanding of the instructions provided during this encounter.   This note was created using a voice recognition software as a result there may be grammatical errors inadvertently enclosed that do not reflect the nature of this encounter. Every attempt is made to correct such errors.  Total time spent: 32  minutes  Mechele Claude [facility]  Pager: [phone] Office: [phone]

## 2021-04-01 DIAGNOSIS — G4733 Obstructive sleep apnea (adult) (pediatric): Secondary | ICD-10-CM | POA: Diagnosis not present

## 2021-04-01 LAB — BASIC METABOLIC PANEL
BUN/Creatinine Ratio: 18 (ref 12–28)
BUN: 18 mg/dL (ref 8–27)
CO2: 24 mmol/L (ref 20–29)
Calcium: 9.8 mg/dL (ref 8.7–10.3)
Chloride: 98 mmol/L (ref 96–106)
Creatinine, Ser: 0.98 mg/dL (ref 0.57–1.00)
Glucose: 111 mg/dL — ABNORMAL HIGH (ref 65–99)
Potassium: 4 mmol/L (ref 3.5–5.2)
Sodium: 137 mmol/L (ref 134–144)
eGFR: 58 mL/min/{1.73_m2} — ABNORMAL LOW (ref >59)

## 2021-04-01 LAB — MAGNESIUM: Magnesium: 1.7 mg/dL (ref 1.6–2.3)

## 2021-04-01 LAB — HEMOGLOBIN AND HEMATOCRIT, BLOOD
Hematocrit: 41.8 % (ref 34.0–46.6)
Hemoglobin: 14.2 g/dL (ref 11.1–15.9)

## 2021-05-12 ENCOUNTER — Other Ambulatory Visit: Payer: Self-pay | Admitting: Cardiology

## 2021-05-12 DIAGNOSIS — I4891 Unspecified atrial fibrillation: Secondary | ICD-10-CM

## 2021-05-14 ENCOUNTER — Telehealth: Payer: Self-pay | Admitting: Adult Health

## 2021-05-14 NOTE — Telephone Encounter (Signed)
Pt called stating that she got a call to confirm her appt and the date given was incorrect. Pt states that her appt was originally scheduled on the 8th of November and now the appt is to be on the first of November. Pt states that she did not schedule it like this due to the fact that they were going to be in [place]. Pt states that no one called or LVM to inform her of the change. She would like for someone to call her with a sooner appt than what was offered in February. Pt wants to make sure that the office is aware that there is a one hour time different.

## 2021-05-14 NOTE — Telephone Encounter (Signed)
Spoke with the patient.  I was able to get her rescheduled to Monday, November 7 at 3:30 PM.  After she was scheduled she asked if her husband can be scheduled after her as they usually do back to back appts for convenience.  I unfortunately did not have another appointment directly afterward for him.  We did get him scheduled in the next availability on 07/21/21.  She verbalized appreciation.  I let her know she could always call back in a later date to see if there were any cancellations that fall back to back.

## 2021-05-19 ENCOUNTER — Ambulatory Visit: Payer: PPO | Admitting: Adult Health

## 2021-05-21 ENCOUNTER — Other Ambulatory Visit: Payer: Self-pay

## 2021-05-21 ENCOUNTER — Encounter: Payer: Self-pay | Admitting: Cardiology

## 2021-05-21 ENCOUNTER — Ambulatory Visit: Payer: PPO | Admitting: Cardiology

## 2021-05-21 VITALS — BP 147/65 | HR 61 | Resp 16 | Ht 61.0 in | Wt 125.8 lb

## 2021-05-21 DIAGNOSIS — G4733 Obstructive sleep apnea (adult) (pediatric): Secondary | ICD-10-CM | POA: Diagnosis not present

## 2021-05-21 DIAGNOSIS — Z9989 Dependence on other enabling machines and devices: Secondary | ICD-10-CM | POA: Diagnosis not present

## 2021-05-21 DIAGNOSIS — Z9889 Other specified postprocedural states: Secondary | ICD-10-CM | POA: Diagnosis not present

## 2021-05-21 DIAGNOSIS — Z7901 Long term (current) use of anticoagulants: Secondary | ICD-10-CM

## 2021-05-21 DIAGNOSIS — E782 Mixed hyperlipidemia: Secondary | ICD-10-CM | POA: Diagnosis not present

## 2021-05-21 DIAGNOSIS — I10 Essential (primary) hypertension: Secondary | ICD-10-CM

## 2021-05-21 DIAGNOSIS — I4819 Other persistent atrial fibrillation: Secondary | ICD-10-CM | POA: Diagnosis not present

## 2021-05-21 MED ORDER — FLECAINIDE ACETATE 50 MG PO TABS: 50.0000 mg | ORAL_TABLET | Freq: Two times a day (BID) | ORAL | 0 refills | Status: DC

## 2021-05-21 NOTE — Progress Notes (Signed)
[name] Date of Birth: 09/04/1940 MRN: [id number] Cardiologist:  Rex Kras, DO, [facility] (established care 11/15/2019) Former Cardiology Providers: Dr. Adrian Prows  Date: 05/21/21 Last Office Visit: 03/31/2021  Chief Complaint  Patient presents with   Atrial Fibrillation   Follow-up   HPI  Kelly Blackwell is a 80 y.o. female who presents to the office with a  chief complaint of " follow-up for atrial fibrillation management."   Her past medical history and cardiovascular risk factors are: Sleep Apnea, atrial fibrillation, hypertension, hyperlipidemia, history of breast cancer, postmenopausal female, advanced age.   Patient was originally referred to the office for evaluation of chest pain in April 2021.   Patient was noted to be in atrial fibrillation in November 2021 and the shared decision was to proceed with rate control strategy.  However despite being on AV nodal blocking agents she did not spontaneously convert to NSR.  Due to her symptoms of shortness of breath and generalized tired and fatigue that shared decision was to proceed with the DCCV in February 2022.  She converted to normal sinus rhythm at the time of the procedure; however, at the 2-week follow-up she was back in A. fib.  At that time patient wanted to continue with rate control strategy and thromboembolic prophylaxis.  She did not want to be on antiarrhythmic medications or be considered for additional EP consultation.  Patient comes in sooner than her scheduled appointment as she has been experiencing shortness of breath predominantly with effort related activities, tired, fatigue.  She denies orthopnea, paroxysmal nocturnal dyspnea, and lower extremity swelling.  Patient states that she would like to be considered for additional therapies for atrial fibrillation whether it be antiarrhythmics versus atrial fibrillation ablation.  She denies any hospitalizations or urgent care visits for cardiovascular symptoms.  She  has undergone ischemic evaluation as outlined below.    FUNCTIONAL STATUS: She walks 5 days a week for 30-45 minutes.    ALLERGIES: Allergies  Allergen Reactions   Beef Allergy Diarrhea and Nausea And Vomiting   Sulfa Antibiotics Other (See Comments)    Fever and rash     MEDICATION LIST PRIOR TO VISIT: Current Meds  Medication Sig   acetaminophen (TYLENOL) 500 MG tablet Take 500-1,000 mg by mouth every 6 (six) hours as needed (pain).   ALPRAZolam (XANAX) 0.25 MG tablet Take 0.25 mg by mouth See admin instructions. Take 1 tablet (0.25 mg) by mouth scheduled at night for sleep; may take an additional dose during the day if needed for anxiety   anastrozole (ARIMIDEX) 1 MG tablet Take 1 tablet (1 mg total) by mouth daily.   apixaban (ELIQUIS) 5 MG TABS tablet Take 1 tablet (5 mg total) by mouth 2 (two) times daily.   atorvastatin (LIPITOR) 10 MG tablet Take 10 mg by mouth daily.    Calcium Carb-Cholecalciferol (CALCIUM 600 + D PO) Take 1 tablet by mouth 2 (two) times daily.   cetirizine (ZYRTEC) 10 MG tablet Take 10 mg by mouth daily.   cholecalciferol (VITAMIN D3) 25 MCG (1000 UT) tablet Take 1,000 Units by mouth daily.   EPINEPHrine 0.3 mg/0.3 mL IJ SOAJ injection Inject 0.3 mg into the muscle as needed for anaphylaxis.   flecainide (TAMBOCOR) 50 MG tablet Take 1 tablet (50 mg total) by mouth 2 (two) times daily. Starting Day 5: Take 2 tablets (170m total) by mouth 2(two) times daily.   methocarbamol (ROBAXIN) 500 MG tablet Take 250 mg by mouth at bedtime. Leg cramps  metoprolol succinate (TOPROL-XL) 25 MG 24 hr tablet TAKE 1 TABLET BY MOUTH EVERY DAY   nitroGLYCERIN (NITROSTAT) 0.4 MG SL tablet DISSOLVE 1 TABLET UNDER THE TONGUE EVERY 5 MINUTES AS NEEDED FOR CHEST PAIN. IF YOU REQUIRE MORE THAN TWO TABLETS FIVE MINUTES APART GO TO THE NEAREST ER VIA EMS. (Patient taking differently: Place 0.4 mg under the tongue every 5 (five) minutes x 3 doses as needed for chest pain.)   omeprazole  (PRILOSEC) 20 MG capsule Take 20 mg by mouth daily.   telmisartan-hydrochlorothiazide (MICARDIS HCT) 80-12.5 MG tablet Take 1 tablet by mouth daily.     PAST MEDICAL HISTORY: Past Medical History:  Diagnosis Date   Allergy    seasonal allergies.    Anxiety    Arthritis    Neck   Atrial fibrillation ([place])    Breast cancer (HCC)    Hypercholesteremia    Hypertension    OSA on CPAP 12/2019   Osteopenia    Personal history of radiation therapy     PAST SURGICAL HISTORY: Past Surgical History:  Procedure Laterality Date   APPENDECTOMY     age 57   BREAST LUMPECTOMY Right 08/03/2018   BREAST LUMPECTOMY WITH RADIOACTIVE SEED AND SENTINEL LYMPH NODE BIOPSY Right 08/03/2018   Procedure: RIGHT BREAST LUMPECTOMY WITH RADIOACTIVE SEED AND SENTINEL LYMPH NODE BIOPSY WITH BLUE DYE INJECTION;  Surgeon: Donnie Mesa, MD;  Location: [place];  Service: General;  Laterality: Right;   bunionectmy     1986   CARDIOVERSION N/A 09/03/2020   Procedure: CARDIOVERSION;  Surgeon: Rex Kras, DO;  Location: [facility];  Service: Cardiovascular;  Laterality: N/A;   CHOLECYSTECTOMY     age 63   TONSILLECTOMY      FAMILY HISTORY: The patient family history includes AAA (abdominal aortic aneurysm) in her father; Atrial fibrillation in her mother; Colon cancer in her father; Coronary artery disease in her father; Heart failure in her mother; Hypertension in her mother; Osteoporosis in her mother.  SOCIAL HISTORY:  The patient  reports that she has never smoked. She has never used smokeless tobacco. She reports that she does not currently use alcohol. She reports that she does not use drugs.  REVIEW OF SYSTEMS: Review of Systems  Constitutional: Positive for malaise/fatigue. Negative for chills and fever.  HENT:  Negative for hoarse voice and nosebleeds.   Eyes:  Negative for discharge, double vision and pain.  Cardiovascular:  Positive for dyspnea on exertion. Negative for chest pain, claudication,  leg swelling, near-syncope, orthopnea, palpitations, paroxysmal nocturnal dyspnea and syncope.  Respiratory:  Positive for shortness of breath. Negative for hemoptysis.   Musculoskeletal:  Negative for muscle cramps and myalgias.  Gastrointestinal:  Negative for abdominal pain, constipation, diarrhea, hematemesis, hematochezia, melena, nausea and vomiting.  Neurological:  Negative for dizziness and light-headedness.  PHYSICAL EXAM: Vitals with BMI 05/21/2021 03/31/2021 09/25/2020  Height 5' 1"  5' 1"  5' 1"   Weight 125 lbs 13 oz 127 lbs 13 oz 126 lbs 10 oz  BMI 23.78 14.43 15.40  Systolic 086 761 950  Diastolic 65 77 78  Pulse 61 69 61   CONSTITUTIONAL: Well-developed and well-nourished. No acute distress.  SKIN: Skin is warm and dry. No rash noted. No cyanosis. No pallor. No jaundice HEAD: Normocephalic and atraumatic.  EYES: No scleral icterus MOUTH/THROAT: Moist oral membranes.  NECK: No JVD present. No thyromegaly noted. No carotid bruits  LYMPHATIC: No visible cervical adenopathy.  CHEST Normal respiratory effort. No intercostal retractions  LUNGS: Clear to  auscultation bilaterally.  No stridor. No wheezes. No rales.  CARDIOVASCULAR: Irregularly irregular, variable S1-S2, no murmurs rubs or gallops appreciated ABDOMINAL: No apparent ascites.  EXTREMITIES: No peripheral edema, warm to touch.  HEMATOLOGIC: No significant bruising NEUROLOGIC: Oriented to person, place, and time. Nonfocal. Normal muscle tone.  PSYCHIATRIC: Normal mood and affect. Normal behavior. Cooperative  CARDIAC DATABASE: EKG: 11/15/2019: Normal sinus rhythm, 79 bpm, normal axis, ST depressions in the inferior and lateral leads suggestive of possible ischemia.  Prior EKG dated 07/31/2018 noted normal sinus with PVCs and nonspecific ST abnormality.  09/03/2020: Normal sinus rhythm, 67 bpm, normal axis, first-degree AV block.  09/25/2020: Atrial fibrillation, normal axis, PRWP, without underlying injury  pattern.  05/21/2021: Atrial fibrillation, 61 bpm, without underlying injury pattern.  Echocardiogram: 11/21/2019: LVEF 60 to 65%, mild LVH, indeterminate diastolic filling pattern, elevated LAP, mild MR, mild TR.  Stress Testing: Lexiscan (Walking with mod Bruce)Tetrofosmin Stress Test  11/19/2019:  Myocardial perfusion is normal.  Overall LV systolic function is normal without regional wall motion abnormalities.  Stress LV EF: 61%.  No previous exam available for comparison. Low risk study.   Heart Catheterization: None  Procedures: 09/03/2020: Direct-current cardioversion, 200 J x 1, atrial fibrillation converted to normal sinus rhythm.  LABORATORY DATA: CBC Latest Ref Rng & Units 03/31/2021 08/28/2020 05/30/2020  WBC 3.4 - 10.8 x10E3/uL - 7.1 -  Hemoglobin 11.1 - 15.9 g/dL 14.2 14.3 12.8  Hematocrit 34.0 - 46.6 % 41.8 43.3 39.7  Platelets 150 - 450 x10E3/uL - 211 -    CMP Latest Ref Rng & Units 03/31/2021 08/28/2020 05/30/2020  Glucose 65 - 99 mg/dL 111(H) 102(H) 115(H)  BUN 8 - 27 mg/dL 18 18 15   Creatinine 0.57 - 1.00 mg/dL 0.98 0.87 0.77  Sodium 134 - 144 mmol/L 137 139 135  Potassium 3.5 - 5.2 mmol/L 4.0 4.5 3.7  Chloride 96 - 106 mmol/L 98 99 98  CO2 20 - 29 mmol/L 24 24 23   Calcium 8.7 - 10.3 mg/dL 9.8 10.4(H) 9.5   External Labs: Collected: 10/11/2019 Creatinine 0.8 mg/dL. eGFR: 69 mL/min per 1.73 m Lipid profile: Total cholesterol 169, triglycerides 54, HDL 79, LDL 79, non-HDL 90 Hemoglobin A1c: None  IMPRESSION:    ICD-10-CM   1. Persistent atrial fibrillation (HCC)  I48.19 EKG 12-Lead    flecainide (TAMBOCOR) 50 MG tablet    CMP14+EGFR    Hemoglobin and hematocrit, blood    2. History of cardioversion  Z98.890     3. Long term (current) use of anticoagulants  Z79.01     4. Benign hypertension  I10     5. Mixed hyperlipidemia  E78.2     6. OSA on CPAP  G47.33    Z99.89        RECOMMENDATIONS: MEYA CLUTTER is a 80 y.o. female whose past  medical history and cardiac risk factors include:Sleep Apnea, atrial fibrillation, hypertension, hyperlipidemia, history of breast cancer, postmenopausal female, advanced age.   Atrial fibrillation: Persistent Rate control: Toprol-XL. Rhythm control: Flecainide Thromboembolic prophylaxis: Eliquis  CHA2DS2-VASc SCORE is 34 (age >63, HTN, female gender) which correlates to 4.0 % risk of stroke per year.  Last direct-current cardioversion in February 2022 and converted to normal sinus after 200 J x 1 but shortly thereafter went back into atrial fibrillation.  Given her symptomatic persistent atrial fibrillation she would like to consider antiarrhythmic medications versus being considered for atrial fibrillation ablation.  After a long discussion the shared decision was to proceed with antiarrhythmic medications.  Start flecainide 50 mg p.o. twice daily for the first 4 days and if she remains asymptomatic with regards to no side effects that she is asked to increase it to 100 mg p.o. twice daily.  Medication profile discussed. Will check CMP and hemoglobin hematocrit. If he remains in atrial fibrillation despite being on antiarrhythmic medications for 4 weeks we will consider direct-current cardioversion.  Long-term oral anticoagulation: Indication: Atrial fibrillation. Does not endorse any evidence of bleeding. Check a BMP and hemoglobin and hematocrit as she is on anticoagulation.  Benign essential hypertension:  Office blood pressure is within acceptable range. Continue current medical therapy. Reemphasized the importance of low-salt diet. Since last office visit she did undergo sleep evaluation and was diagnosed with obstructive sleep apnea of moderate intensity.  Is currently using CPAP machine.    Mixed hyperlipidemia: Continue statin therapy.  Most recent lipid profile reviewed.  Currently managed by PCP.  FINAL MEDICATION LIST END OF ENCOUNTER: Meds ordered this encounter  Medications    flecainide (TAMBOCOR) 50 MG tablet    Sig: Take 1 tablet (50 mg total) by mouth 2 (two) times daily. Starting Day 5: Take 2 tablets (185m total) by mouth 2(two) times daily.    Dispense:  180 tablet    Refill:  0     There are no discontinued medications.    Current Outpatient Medications:    acetaminophen (TYLENOL) 500 MG tablet, Take 500-1,000 mg by mouth every 6 (six) hours as needed (pain)., Disp: , Rfl:    ALPRAZolam (XANAX) 0.25 MG tablet, Take 0.25 mg by mouth See admin instructions. Take 1 tablet (0.25 mg) by mouth scheduled at night for sleep; may take an additional dose during the day if needed for anxiety, Disp: , Rfl:    anastrozole (ARIMIDEX) 1 MG tablet, Take 1 tablet (1 mg total) by mouth daily., Disp: 90 tablet, Rfl: 3   apixaban (ELIQUIS) 5 MG TABS tablet, Take 1 tablet (5 mg total) by mouth 2 (two) times daily., Disp: 180 tablet, Rfl: 1   atorvastatin (LIPITOR) 10 MG tablet, Take 10 mg by mouth daily. , Disp: , Rfl:    Calcium Carb-Cholecalciferol (CALCIUM 600 + D PO), Take 1 tablet by mouth 2 (two) times daily., Disp: , Rfl:    cetirizine (ZYRTEC) 10 MG tablet, Take 10 mg by mouth daily., Disp: , Rfl:    cholecalciferol (VITAMIN D3) 25 MCG (1000 UT) tablet, Take 1,000 Units by mouth daily., Disp: , Rfl:    EPINEPHrine 0.3 mg/0.3 mL IJ SOAJ injection, Inject 0.3 mg into the muscle as needed for anaphylaxis., Disp: , Rfl:    flecainide (TAMBOCOR) 50 MG tablet, Take 1 tablet (50 mg total) by mouth 2 (two) times daily. Starting Day 5: Take 2 tablets (1040mtotal) by mouth 2(two) times daily., Disp: 180 tablet, Rfl: 0   methocarbamol (ROBAXIN) 500 MG tablet, Take 250 mg by mouth at bedtime. Leg cramps, Disp: , Rfl:    metoprolol succinate (TOPROL-XL) 25 MG 24 hr tablet, TAKE 1 TABLET BY MOUTH EVERY DAY, Disp: 90 tablet, Rfl: 1   nitroGLYCERIN (NITROSTAT) 0.4 MG SL tablet, DISSOLVE 1 TABLET UNDER THE TONGUE EVERY 5 MINUTES AS NEEDED FOR CHEST PAIN. IF YOU REQUIRE MORE THAN TWO  TABLETS FIVE MINUTES APART GO TO THE NEAREST ER VIA EMS. (Patient taking differently: Place 0.4 mg under the tongue every 5 (five) minutes x 3 doses as needed for chest pain.), Disp: 100 tablet, Rfl: 1   omeprazole (PRILOSEC) 20 MG capsule,  Take 20 mg by mouth daily., Disp: , Rfl:    telmisartan-hydrochlorothiazide (MICARDIS HCT) 80-12.5 MG tablet, Take 1 tablet by mouth daily., Disp: , Rfl:   Orders Placed This Encounter  Procedures   CMP14+EGFR   Hemoglobin and hematocrit, blood   EKG 12-Lead   --Continue cardiac medications as reconciled in final medication list. --Return in about 4 weeks (around 06/18/2021) for Follow up, A. fib, EKG on arrival.. Or sooner if needed. --Continue follow-up with your primary care physician regarding the management of your other chronic comorbid conditions.  Patient's questions and concerns were addressed to her satisfaction. She voices understanding of the instructions provided during this encounter.   This note was created using a voice recognition software as a result there may be grammatical errors inadvertently enclosed that do not reflect the nature of this encounter. Every attempt is made to correct such errors.  Total time spent: 37 minutes  Kelly Blackwell [facility]  Pager: [phone] Office: [phone]

## 2021-05-22 LAB — HEMOGLOBIN AND HEMATOCRIT, BLOOD
Hematocrit: 43.1 % (ref 34.0–46.6)
Hemoglobin: 14.8 g/dL (ref 11.1–15.9)

## 2021-05-22 LAB — CMP14+EGFR
ALT: 26 IU/L (ref 0–32)
AST: 29 IU/L (ref 0–40)
Albumin/Globulin Ratio: 2.4 — ABNORMAL HIGH (ref 1.2–2.2)
Albumin: 5 g/dL — ABNORMAL HIGH (ref 3.7–4.7)
Alkaline Phosphatase: 72 IU/L (ref 44–121)
BUN/Creatinine Ratio: 22 (ref 12–28)
BUN: 19 mg/dL (ref 8–27)
Bilirubin Total: 0.5 mg/dL (ref 0.0–1.2)
CO2: 26 mmol/L (ref 20–29)
Calcium: 10.2 mg/dL (ref 8.7–10.3)
Chloride: 98 mmol/L (ref 96–106)
Creatinine, Ser: 0.85 mg/dL (ref 0.57–1.00)
Globulin, Total: 2.1 g/dL (ref 1.5–4.5)
Glucose: 60 mg/dL — ABNORMAL LOW (ref 70–99)
Potassium: 4.3 mmol/L (ref 3.5–5.2)
Sodium: 139 mmol/L (ref 134–144)
Total Protein: 7.1 g/dL (ref 6.0–8.5)
eGFR: 69 mL/min/{1.73_m2} (ref >59)

## 2021-05-22 NOTE — Progress Notes (Signed)
No answer left vm.  

## 2021-05-25 ENCOUNTER — Encounter: Payer: Self-pay | Admitting: Adult Health

## 2021-05-25 ENCOUNTER — Ambulatory Visit: Payer: PPO | Admitting: Adult Health

## 2021-05-25 VITALS — BP 152/80 | HR 57 | Ht 61.0 in | Wt 127.4 lb

## 2021-05-25 DIAGNOSIS — Z9989 Dependence on other enabling machines and devices: Secondary | ICD-10-CM

## 2021-05-25 DIAGNOSIS — G4733 Obstructive sleep apnea (adult) (pediatric): Secondary | ICD-10-CM

## 2021-05-25 NOTE — Progress Notes (Signed)
PATIENT: Kelly Blackwell DOB: 10-09-1940  REASON FOR VISIT: follow up HISTORY FROM: patient   HISTORY OF PRESENT ILLNESS: Today 05/25/21 Kelly Blackwell is a 80 y.o. female with a history of OSA on CPAP. She returns today for a follow up. Patient had a sleep study completed recently which showed that she would be better treated on a pressure setting of 4 -8 cmH2O. Her download for the last 30 days indicates that she uses her machine for 21 of the nights for a compliance of 70%. She use her machine greater than 4 hours each night. On average she uses her machine 6 hours and 42 minutes.  Her residual AHI is 6.7 on 4 to 8 cm of water. She expresses concern for having a higher AHI. Patient should be wearing a full face mask but she endorses discomfort and has only found the nasal pillows to be comfortable enough for her to wear. We will order a refitting for the full face dreamwear mask since it may feel less claustrophobic for her.    HISTORY  Kelly Blackwell is a 80 y.o. female who is being seen today for the evaluation of chest pain at the request of Leanna Battles, MD. Patient's past medical history and cardiac risk factors include: Hypertension, hyperlipidemia, history of breast cancer, postmenopausal female, recent BP in the 180s.      REVIEW OF SYSTEMS: Out of a complete 14 system review of symptoms, the patient complains only of the following symptoms, and all other reviewed systems are negative.  ESS 4  ALLERGIES: Allergies  Allergen Reactions   Beef Allergy Diarrhea and Nausea And Vomiting   Sulfa Antibiotics Other (See Comments)    Fever and rash     HOME MEDICATIONS: Outpatient Medications Prior to Visit  Medication Sig Dispense Refill   acetaminophen (TYLENOL) 500 MG tablet Take 500-1,000 mg by mouth every 6 (six) hours as needed (pain).     ALPRAZolam (XANAX) 0.25 MG tablet Take 0.25 mg by mouth See admin instructions. Take 1 tablet (0.25 mg) by mouth scheduled at night for  sleep; may take an additional dose during the day if needed for anxiety     anastrozole (ARIMIDEX) 1 MG tablet Take 1 tablet (1 mg total) by mouth daily. 90 tablet 3   apixaban (ELIQUIS) 5 MG TABS tablet Take 1 tablet (5 mg total) by mouth 2 (two) times daily. 180 tablet 1   atorvastatin (LIPITOR) 10 MG tablet Take 10 mg by mouth daily.      Calcium Carb-Cholecalciferol (CALCIUM 600 + D PO) Take 1 tablet by mouth 2 (two) times daily.     cetirizine (ZYRTEC) 10 MG tablet Take 10 mg by mouth daily.     cholecalciferol (VITAMIN D3) 25 MCG (1000 UT) tablet Take 1,000 Units by mouth daily.     EPINEPHrine 0.3 mg/0.3 mL IJ SOAJ injection Inject 0.3 mg into the muscle as needed for anaphylaxis.     flecainide (TAMBOCOR) 50 MG tablet Take 1 tablet (50 mg total) by mouth 2 (two) times daily. Starting Day 5: Take 2 tablets (100mg  total) by mouth 2(two) times daily. 180 tablet 0   methocarbamol (ROBAXIN) 500 MG tablet Take 250 mg by mouth at bedtime. Leg cramps     metoprolol succinate (TOPROL-XL) 25 MG 24 hr tablet TAKE 1 TABLET BY MOUTH EVERY DAY 90 tablet 1   nitroGLYCERIN (NITROSTAT) 0.4 MG SL tablet DISSOLVE 1 TABLET UNDER THE TONGUE EVERY 5 MINUTES AS NEEDED FOR  CHEST PAIN. IF YOU REQUIRE MORE THAN TWO TABLETS FIVE MINUTES APART GO TO THE NEAREST ER VIA EMS. (Patient taking differently: Place 0.4 mg under the tongue every 5 (five) minutes x 3 doses as needed for chest pain.) 100 tablet 1   omeprazole (PRILOSEC) 20 MG capsule Take 20 mg by mouth daily.     telmisartan-hydrochlorothiazide (MICARDIS HCT) 80-12.5 MG tablet Take 1 tablet by mouth daily.     No facility-administered medications prior to visit.    PAST MEDICAL HISTORY: Past Medical History:  Diagnosis Date   Allergy    seasonal allergies.    Anxiety    Arthritis    Neck   Atrial fibrillation ([place])    Breast cancer (HCC)    Hypercholesteremia    Hypertension    OSA on CPAP 12/2019   Osteopenia    Personal history of radiation  therapy     PAST SURGICAL HISTORY: Past Surgical History:  Procedure Laterality Date   APPENDECTOMY     age 27   BREAST LUMPECTOMY Right 08/03/2018   BREAST LUMPECTOMY WITH RADIOACTIVE SEED AND SENTINEL LYMPH NODE BIOPSY Right 08/03/2018   Procedure: RIGHT BREAST LUMPECTOMY WITH RADIOACTIVE SEED AND SENTINEL LYMPH NODE BIOPSY WITH BLUE DYE INJECTION;  Surgeon: Donnie Mesa, MD;  Location: [place];  Service: General;  Laterality: Right;   bunionectmy     1986   CARDIOVERSION N/A 09/03/2020   Procedure: CARDIOVERSION;  Surgeon: Rex Kras, DO;  Location: [facility];  Service: Cardiovascular;  Laterality: N/A;   CHOLECYSTECTOMY     age 56   TONSILLECTOMY      FAMILY HISTORY: Family History  Problem Relation Age of Onset   Hypertension Mother    Osteoporosis Mother    Atrial fibrillation Mother    Heart failure Mother    Colon cancer Father    Coronary artery disease Father    AAA (abdominal aortic aneurysm) Father    Sleep apnea Neg Hx     SOCIAL HISTORY: Social History   Socioeconomic History   Marital status: Married    Spouse name: Not on file   Number of children: 3   Years of education: Not on file   Highest education level: Not on file  Occupational History   Not on file  Tobacco Use   Smoking status: Never   Smokeless tobacco: Never  Vaping Use   Vaping Use: Never used  Substance and Sexual Activity   Alcohol use: Not Currently    Comment: occasionally, a couple of times a month.    Drug use: Never   Sexual activity: Not on file  Other Topics Concern   Not on file  Social History Narrative   Not on file   Social Determinants of Health   Financial Resource Strain: Not on file  Food Insecurity: Not on file  Transportation Needs: Not on file  Physical Activity: Not on file  Stress: Not on file  Social Connections: Not on file  Intimate Partner Violence: Not on file      PHYSICAL EXAM  Vitals:   05/25/21 1529  BP: (!) 152/80  Pulse: (!)  57  Weight: 127 lb 6.4 oz (57.8 kg)  Height: 5\' 1"  (1.549 m)   Body mass index is 24.07 kg/m.  Generalized: Well developed, in no acute distress  Chest: Lungs clear to auscultation bilaterally  Neurological examination  Mentation: Alert oriented to time, place, history taking. Follows all commands speech and language fluent Cranial nerve II-XII: Extraocular movements were  full, visual field were full on confrontational test Head turning and shoulder shrug  were normal and symmetric. Motor: The motor testing reveals 5 over 5 strength of all 4 extremities. Good symmetric motor tone is noted throughout.  Sensory: Sensory testing is intact to soft touch on all 4 extremities. No evidence of extinction is noted.  Gait and station: Gait is normal.    DIAGNOSTIC DATA (LABS, IMAGING, TESTING) - I reviewed patient records, labs, notes, testing and imaging myself where available.  Lab Results  Component Value Date   WBC 7.1 08/28/2020   HGB 14.8 05/21/2021   HCT 43.1 05/21/2021   MCV 84 08/28/2020   PLT 211 08/28/2020      Component Value Date/Time   NA 139 05/21/2021 1553   K 4.3 05/21/2021 1553   CL 98 05/21/2021 1553   CO2 26 05/21/2021 1553   GLUCOSE 60 (L) 05/21/2021 1553   GLUCOSE 98 07/31/2018 1138   BUN 19 05/21/2021 1553   CREATININE 0.85 05/21/2021 1553   CALCIUM 10.2 05/21/2021 1553   PROT 7.1 05/21/2021 1553   ALBUMIN 5.0 (H) 05/21/2021 1553   AST 29 05/21/2021 1553   ALT 26 05/21/2021 1553   ALKPHOS 72 05/21/2021 1553   BILITOT 0.5 05/21/2021 1553   GFRNONAA 64 08/28/2020 1200   GFRAA 73 08/28/2020 1200      ASSESSMENT AND PLAN 80 y.o. year old female  has a past medical history of Allergy, Anxiety, Arthritis, Atrial fibrillation ([place]), Breast cancer ([place]), Hypercholesteremia, Hypertension, OSA on CPAP (12/2019), Osteopenia, and Personal history of radiation therapy. here with:  OSA on CPAP  - CPAP compliance good - Good treatment of AHI  - Encourage  patient to use CPAP nightly and > 4 hours each night -Fitting for full face dreamwear mask ordered - F/U in 1 year or sooner if needed    [name], MSN, NP-C 05/25/2021, 3:29 PM [facility] [address], [place], [place] [address] [phone]

## 2021-05-26 ENCOUNTER — Ambulatory Visit: Payer: PPO | Admitting: Adult Health

## 2021-05-27 NOTE — Progress Notes (Signed)
No answer left a vm

## 2021-05-27 NOTE — Progress Notes (Signed)
Patient is aware 

## 2021-05-31 ENCOUNTER — Other Ambulatory Visit: Payer: Self-pay | Admitting: Cardiology

## 2021-05-31 DIAGNOSIS — I4891 Unspecified atrial fibrillation: Secondary | ICD-10-CM

## 2021-06-15 ENCOUNTER — Other Ambulatory Visit: Payer: Self-pay | Admitting: Hematology and Oncology

## 2021-06-15 DIAGNOSIS — Z9889 Other specified postprocedural states: Secondary | ICD-10-CM

## 2021-06-18 ENCOUNTER — Encounter: Payer: Self-pay | Admitting: Cardiology

## 2021-06-18 ENCOUNTER — Ambulatory Visit: Payer: PPO | Admitting: Cardiology

## 2021-06-18 ENCOUNTER — Other Ambulatory Visit: Payer: Self-pay

## 2021-06-18 VITALS — BP 123/70 | HR 60 | Resp 16 | Ht 61.0 in | Wt 126.8 lb

## 2021-06-18 DIAGNOSIS — G4733 Obstructive sleep apnea (adult) (pediatric): Secondary | ICD-10-CM | POA: Diagnosis not present

## 2021-06-18 DIAGNOSIS — Z79899 Other long term (current) drug therapy: Secondary | ICD-10-CM | POA: Diagnosis not present

## 2021-06-18 DIAGNOSIS — I10 Essential (primary) hypertension: Secondary | ICD-10-CM | POA: Diagnosis not present

## 2021-06-18 DIAGNOSIS — Z9889 Other specified postprocedural states: Secondary | ICD-10-CM | POA: Diagnosis not present

## 2021-06-18 DIAGNOSIS — Z9989 Dependence on other enabling machines and devices: Secondary | ICD-10-CM

## 2021-06-18 DIAGNOSIS — Z7901 Long term (current) use of anticoagulants: Secondary | ICD-10-CM | POA: Diagnosis not present

## 2021-06-18 DIAGNOSIS — E782 Mixed hyperlipidemia: Secondary | ICD-10-CM

## 2021-06-18 DIAGNOSIS — I4819 Other persistent atrial fibrillation: Secondary | ICD-10-CM

## 2021-06-18 NOTE — Progress Notes (Signed)
Kelly Blackwell Date of Birth: July 10, 1941 MRN: [id number] Cardiologist:  Rex Kras, DO, [facility] (established care 11/15/2019) Former Cardiology Providers: Dr. Adrian Prows  Date: 06/18/21 Last Office Visit: 05/21/2021  Chief Complaint  Patient presents with   Atrial Fibrillation   Follow-up   HPI  Kelly Blackwell is a 80 y.o. female who presents to the office with a  chief complaint of " follow-up for atrial fibrillation management."   Her past medical history and cardiovascular risk factors are: Sleep Apnea, atrial fibrillation, hypertension, hyperlipidemia, history of breast cancer, postmenopausal female, advanced age.   Patient was originally referred to the office for evaluation of chest pain in April 2021.   During her routine visit in November 2021 patient was noted to be in atrial fibrillation and initially chose to proceed with rate control strategy which was not successful in restoring normal sinus rhythm.  Due to her symptoms of shortness of breath, tired, fatigue the shared decision was to proceed with direct-current cardioversion in February 2022.  Patient did convert to normal sinus rhythm after the procedure however at the 2-week follow-up she was back in A. fib.  At that point we discussed initiating antiarrhythmic medication the patient wanted to continue current medical therapy with rate control strategy and thromboembolic prophylaxis.  In the interim I have also discussed with her with regards to consulting electrophysiology for options such as antiarrhythmic medications versus atrial fibrillation ablation.  However, at the last visit patient was started on flecainide and is currently taking 100 mg p.o. twice daily.  EKG today still notes atrial fibrillation plan patient states that she continues to feel tired, fatigued, decreased overall energy and knows that she is in A. fib.  No hospitalizations for congestive heart failure or ACS since last office visit.   FUNCTIONAL  STATUS: She walks 5 days a week for 30-45 minutes.    ALLERGIES: Allergies  Allergen Reactions   Beef Allergy Diarrhea and Nausea And Vomiting   Sulfa Antibiotics Other (See Comments)    Fever and rash     MEDICATION LIST PRIOR TO VISIT: Current Meds  Medication Sig   acetaminophen (TYLENOL) 500 MG tablet Take 500-1,000 mg by mouth every 6 (six) hours as needed (pain).   ALPRAZolam (XANAX) 0.25 MG tablet Take 0.25 mg by mouth See admin instructions. Take 1 tablet (0.25 mg) by mouth scheduled at night for sleep; may take an additional dose during the day if needed for anxiety   anastrozole (ARIMIDEX) 1 MG tablet Take 1 tablet (1 mg total) by mouth daily.   atorvastatin (LIPITOR) 10 MG tablet Take 10 mg by mouth daily.    Calcium Carb-Cholecalciferol (CALCIUM 600 + D PO) Take 1 tablet by mouth 2 (two) times daily.   cetirizine (ZYRTEC) 10 MG tablet Take 10 mg by mouth daily.   cholecalciferol (VITAMIN D3) 25 MCG (1000 UT) tablet Take 1,000 Units by mouth daily.   ELIQUIS 5 MG TABS tablet TAKE 1 TABLET BY MOUTH TWICE A DAY   EPINEPHrine 0.3 mg/0.3 mL IJ SOAJ injection Inject 0.3 mg into the muscle as needed for anaphylaxis.   flecainide (TAMBOCOR) 50 MG tablet Take 1 tablet (50 mg total) by mouth 2 (two) times daily. Starting Day 5: Take 2 tablets (129m total) by mouth 2(two) times daily.   methocarbamol (ROBAXIN) 500 MG tablet Take 250 mg by mouth at bedtime. Leg cramps   metoprolol succinate (TOPROL-XL) 25 MG 24 hr tablet TAKE 1 TABLET BY MOUTH EVERY DAY  nitroGLYCERIN (NITROSTAT) 0.4 MG SL tablet DISSOLVE 1 TABLET UNDER THE TONGUE EVERY 5 MINUTES AS NEEDED FOR CHEST PAIN. IF YOU REQUIRE MORE THAN TWO TABLETS FIVE MINUTES APART GO TO THE NEAREST ER VIA EMS. (Patient taking differently: Place 0.4 mg under the tongue every 5 (five) minutes x 3 doses as needed for chest pain.)   omeprazole (PRILOSEC) 20 MG capsule Take 20 mg by mouth daily.   telmisartan-hydrochlorothiazide (MICARDIS HCT)  80-12.5 MG tablet Take 1 tablet by mouth daily.     PAST MEDICAL HISTORY: Past Medical History:  Diagnosis Date   Allergy    seasonal allergies.    Anxiety    Arthritis    Neck   Atrial fibrillation ([place])    Breast cancer (HCC)    Hypercholesteremia    Hypertension    OSA on CPAP 12/2019   Osteopenia    Personal history of radiation therapy     PAST SURGICAL HISTORY: Past Surgical History:  Procedure Laterality Date   APPENDECTOMY     age 60   BREAST LUMPECTOMY Right 08/03/2018   BREAST LUMPECTOMY WITH RADIOACTIVE SEED AND SENTINEL LYMPH NODE BIOPSY Right 08/03/2018   Procedure: RIGHT BREAST LUMPECTOMY WITH RADIOACTIVE SEED AND SENTINEL LYMPH NODE BIOPSY WITH BLUE DYE INJECTION;  Surgeon: Donnie Mesa, MD;  Location: [place] [facility];  Service: General;  Laterality: Right;   bunionectmy     1986   CARDIOVERSION N/A 09/03/2020   Procedure: CARDIOVERSION;  Surgeon: Rex Kras, DO;  Location: [facility];  Service: Cardiovascular;  Laterality: N/A;   CHOLECYSTECTOMY     age 74   TONSILLECTOMY      FAMILY HISTORY: The patient family history includes AAA (abdominal aortic aneurysm) in her father; Atrial fibrillation in her mother; Colon cancer in her father; Coronary artery disease in her father; Heart failure in her mother; Hypertension in her mother; Osteoporosis in her mother.  SOCIAL HISTORY:  The patient  reports that she has never smoked. She has never used smokeless tobacco. She reports that she does not currently use alcohol. She reports that she does not use drugs.  REVIEW OF SYSTEMS: Review of Systems  Constitutional: Positive for malaise/fatigue. Negative for chills and fever.  HENT:  Negative for hoarse voice and nosebleeds.   Eyes:  Negative for discharge, double vision and pain.  Cardiovascular:  Positive for dyspnea on exertion. Negative for chest pain, claudication, leg swelling, near-syncope, orthopnea, palpitations, paroxysmal nocturnal dyspnea and syncope.   Respiratory:  Positive for shortness of breath. Negative for hemoptysis.   Musculoskeletal:  Negative for muscle cramps and myalgias.  Gastrointestinal:  Negative for abdominal pain, constipation, diarrhea, hematemesis, hematochezia, melena, nausea and vomiting.  Neurological:  Negative for dizziness and light-headedness.   PHYSICAL EXAM: Vitals with BMI 06/18/2021 05/25/2021 05/21/2021  Height 5' 1"  5' 1"  5' 1"   Weight 126 lbs 13 oz 127 lbs 6 oz 125 lbs 13 oz  BMI 23.97 79.89 21.19  Systolic 417 408 144  Diastolic 70 80 65  Pulse 60 57 61   CONSTITUTIONAL: Well-developed and well-nourished. No acute distress.  SKIN: Skin is warm and dry. No rash noted. No cyanosis. No pallor. No jaundice HEAD: Normocephalic and atraumatic.  EYES: No scleral icterus MOUTH/THROAT: Moist oral membranes.  NECK: No JVD present. No thyromegaly noted. No carotid bruits  LYMPHATIC: No visible cervical adenopathy.  CHEST Normal respiratory effort. No intercostal retractions  LUNGS: Clear to auscultation bilaterally.  No stridor. No wheezes. No rales.  CARDIOVASCULAR: Irregularly irregular, variable S1-S2, no  murmurs rubs or gallops appreciated ABDOMINAL: No apparent ascites.  EXTREMITIES: No peripheral edema, warm to touch.  HEMATOLOGIC: No significant bruising NEUROLOGIC: Oriented to person, place, and time. Nonfocal. Normal muscle tone.  PSYCHIATRIC: Normal mood and affect. Normal behavior. Cooperative  CARDIAC DATABASE: EKG: 11/15/2019: Normal sinus rhythm, 79 bpm, normal axis, ST depressions in the inferior and lateral leads suggestive of possible ischemia.  Prior EKG dated 07/31/2018 noted normal sinus with PVCs and nonspecific ST abnormality.  09/03/2020: Normal sinus rhythm, 67 bpm, normal axis, first-degree AV block.  06/18/2021: Atrial fibrillation, 68 bpm, poor R wave progression, without underlying injury pattern.   Echocardiogram: 11/21/2019: LVEF 60 to 65%, mild LVH, indeterminate diastolic  filling pattern, elevated LAP, mild MR, mild TR.  Stress Testing: Lexiscan (Walking with mod Bruce)Tetrofosmin Stress Test  11/19/2019:  Myocardial perfusion is normal.  Overall LV systolic function is normal without regional wall motion abnormalities.  Stress LV EF: 61%.  No previous exam available for comparison. Low risk study.   Heart Catheterization: None  Procedures: 09/03/2020: Direct-current cardioversion, 200 J x 1, atrial fibrillation converted to normal sinus rhythm.  LABORATORY DATA: CBC Latest Ref Rng & Units 05/21/2021 03/31/2021 08/28/2020  WBC 3.4 - 10.8 x10E3/uL - - 7.1  Hemoglobin 11.1 - 15.9 g/dL 14.8 14.2 14.3  Hematocrit 34.0 - 46.6 % 43.1 41.8 43.3  Platelets 150 - 450 x10E3/uL - - 211    CMP Latest Ref Rng & Units 05/21/2021 03/31/2021 08/28/2020  Glucose 70 - 99 mg/dL 60(L) 111(H) 102(H)  BUN 8 - 27 mg/dL 19 18 18   Creatinine 0.57 - 1.00 mg/dL 0.85 0.98 0.87  Sodium 134 - 144 mmol/L 139 137 139  Potassium 3.5 - 5.2 mmol/L 4.3 4.0 4.5  Chloride 96 - 106 mmol/L 98 98 99  CO2 20 - 29 mmol/L 26 24 24   Calcium 8.7 - 10.3 mg/dL 10.2 9.8 10.4(H)  Total Protein 6.0 - 8.5 g/dL 7.1 - -  Total Bilirubin 0.0 - 1.2 mg/dL 0.5 - -  Alkaline Phos 44 - 121 IU/L 72 - -  AST 0 - 40 IU/L 29 - -  ALT 0 - 32 IU/L 26 - -   External Labs: Collected: 10/11/2019 Creatinine 0.8 mg/dL. eGFR: 69 mL/min per 1.73 m Lipid profile: Total cholesterol 169, triglycerides 54, HDL 79, LDL 79, non-HDL 90 Hemoglobin A1c: None  IMPRESSION:    ICD-10-CM   1. Persistent atrial fibrillation (HCC)  I48.19 EKG 12-Lead    CBC    Basic metabolic panel    2. Long term (current) use of anticoagulants  Z79.01     3. Long term current use of antiarrhythmic drug  Z79.899     4. History of cardioversion  Z98.890     5. Benign hypertension  I10     6. Mixed hyperlipidemia  E78.2     7. OSA on CPAP  G47.33    Z99.89        RECOMMENDATIONS: DERRICK ORRIS is a 80 y.o. female whose past  medical history and cardiac risk factors include:Sleep Apnea, atrial fibrillation, hypertension, hyperlipidemia, history of breast cancer, postmenopausal female, advanced age.   Atrial fibrillation: Persistent Rate control: Toprol-XL. Rhythm control: Flecainide Thromboembolic prophylaxis: Eliquis  CHA2DS2-VASc SCORE is 2 (age >40, HTN, female gender) which correlates to 4.0 % risk of stroke per year.  Last direct-current cardioversion in February 2022 and converted to normal sinus after 200 J x 1 but shortly thereafter went back into atrial fibrillation.  Since last visit  has been on flecainide and is currently taking 100 mg p.o. twice daily. Since she remains in atrial fibrillation we discussed considering cardioversion since she is now on antiarrhythmic medications versus seeing EP. Patient states that she would like to proceed with cardioversion without transesophageal echo guidance in January 2023 after the holidays. However, after the second cardioversion if she goes back in A. fib she is highly considering seeing EP for catheter directed therapy. Check CBC and BMP. Risks, benefits, and alternatives of direct current cardioversion reviewed with the and patient. Risk includes but not limited to: potential for post-cardioversion rhythms, life-threatening arrhythmias (ventricular tachycardia and fibrillation, profound bradycardia, cardiac arrest) needing cardiopulmonary resuscitation intervention, myocardial damage, acute pulmonary edema, skin burns, transient hypotension. Benefits include restoration of sinus rhythm. Alternatives to treatment were discussed, questions were answered, patient voices understanding and provides verbal feedback.  Patient is willing to proceed.   Long-term oral anticoagulation: Indication: Atrial fibrillation. Does not endorse any evidence of bleeding. Check a BMP and hemoglobin and hematocrit as she is on anticoagulation.  Benign essential hypertension:  Office  blood pressure is within acceptable range. Continue current medical therapy. Reemphasized the importance of low-salt diet. Since last office visit she did undergo sleep evaluation and was diagnosed with obstructive sleep apnea of moderate intensity.  Is currently using CPAP machine.    Mixed hyperlipidemia: Continue statin therapy.  Most recent lipid profile reviewed.  Currently managed by PCP.  FINAL MEDICATION LIST END OF ENCOUNTER: No orders of the defined types were placed in this encounter.    There are no discontinued medications.    Current Outpatient Medications:    acetaminophen (TYLENOL) 500 MG tablet, Take 500-1,000 mg by mouth every 6 (six) hours as needed (pain)., Disp: , Rfl:    ALPRAZolam (XANAX) 0.25 MG tablet, Take 0.25 mg by mouth See admin instructions. Take 1 tablet (0.25 mg) by mouth scheduled at night for sleep; may take an additional dose during the day if needed for anxiety, Disp: , Rfl:    anastrozole (ARIMIDEX) 1 MG tablet, Take 1 tablet (1 mg total) by mouth daily., Disp: 90 tablet, Rfl: 3   atorvastatin (LIPITOR) 10 MG tablet, Take 10 mg by mouth daily. , Disp: , Rfl:    Calcium Carb-Cholecalciferol (CALCIUM 600 + D PO), Take 1 tablet by mouth 2 (two) times daily., Disp: , Rfl:    cetirizine (ZYRTEC) 10 MG tablet, Take 10 mg by mouth daily., Disp: , Rfl:    cholecalciferol (VITAMIN D3) 25 MCG (1000 UT) tablet, Take 1,000 Units by mouth daily., Disp: , Rfl:    ELIQUIS 5 MG TABS tablet, TAKE 1 TABLET BY MOUTH TWICE A DAY, Disp: 180 tablet, Rfl: 1   EPINEPHrine 0.3 mg/0.3 mL IJ SOAJ injection, Inject 0.3 mg into the muscle as needed for anaphylaxis., Disp: , Rfl:    flecainide (TAMBOCOR) 50 MG tablet, Take 1 tablet (50 mg total) by mouth 2 (two) times daily. Starting Day 5: Take 2 tablets (125m total) by mouth 2(two) times daily., Disp: 180 tablet, Rfl: 0   methocarbamol (ROBAXIN) 500 MG tablet, Take 250 mg by mouth at bedtime. Leg cramps, Disp: , Rfl:     metoprolol succinate (TOPROL-XL) 25 MG 24 hr tablet, TAKE 1 TABLET BY MOUTH EVERY DAY, Disp: 90 tablet, Rfl: 1   nitroGLYCERIN (NITROSTAT) 0.4 MG SL tablet, DISSOLVE 1 TABLET UNDER THE TONGUE EVERY 5 MINUTES AS NEEDED FOR CHEST PAIN. IF YOU REQUIRE MORE THAN TWO TABLETS FIVE MINUTES APART GO TO  THE NEAREST ER VIA EMS. (Patient taking differently: Place 0.4 mg under the tongue every 5 (five) minutes x 3 doses as needed for chest pain.), Disp: 100 tablet, Rfl: 1   omeprazole (PRILOSEC) 20 MG capsule, Take 20 mg by mouth daily., Disp: , Rfl:    telmisartan-hydrochlorothiazide (MICARDIS HCT) 80-12.5 MG tablet, Take 1 tablet by mouth daily., Disp: , Rfl:   Orders Placed This Encounter  Procedures   CBC   Basic metabolic panel   EKG [id number]   --Continue cardiac medications as reconciled in final medication list. --Return in about 7 weeks (around 08/05/2021) for Follow up , A. fib. Or sooner if needed. --Continue follow-up with your primary care physician regarding the management of your other chronic comorbid conditions.  Patient's questions and concerns were addressed to her satisfaction. She voices understanding of the instructions provided during this encounter.   This note was created using a voice recognition software as a result there may be grammatical errors inadvertently enclosed that do not reflect the nature of this encounter. Every attempt is made to correct such errors.  Total time spent: 37 minutes  Mechele Claude [facility]  Pager: [phone] Office: [phone]

## 2021-06-21 ENCOUNTER — Encounter: Payer: Self-pay | Admitting: Cardiology

## 2021-06-29 ENCOUNTER — Other Ambulatory Visit: Payer: Self-pay | Admitting: Cardiology

## 2021-06-29 DIAGNOSIS — I4819 Other persistent atrial fibrillation: Secondary | ICD-10-CM

## 2021-06-29 NOTE — Telephone Encounter (Signed)
Okay to refill? 

## 2021-07-03 ENCOUNTER — Other Ambulatory Visit: Payer: Self-pay

## 2021-07-03 DIAGNOSIS — I4819 Other persistent atrial fibrillation: Secondary | ICD-10-CM

## 2021-07-03 MED ORDER — FLECAINIDE ACETATE 100 MG PO TABS: 100.0000 mg | ORAL_TABLET | Freq: Two times a day (BID) | ORAL | 1 refills | Status: DC

## 2021-07-03 NOTE — Telephone Encounter (Signed)
Yes

## 2021-07-08 ENCOUNTER — Encounter (HOSPITAL_COMMUNITY): Payer: Self-pay | Admitting: Cardiology

## 2021-07-14 DIAGNOSIS — I4819 Other persistent atrial fibrillation: Secondary | ICD-10-CM | POA: Diagnosis not present

## 2021-07-15 LAB — CBC
Hematocrit: 43.9 % (ref 34.0–46.6)
Hemoglobin: 14.4 g/dL (ref 11.1–15.9)
MCH: 28.7 pg (ref 26.6–33.0)
MCHC: 32.8 g/dL (ref 31.5–35.7)
MCV: 88 fL (ref 79–97)
Platelets: 221 10*3/uL (ref 150–450)
RBC: 5.01 x10E6/uL (ref 3.77–5.28)
RDW: 12.1 % (ref 11.7–15.4)
WBC: 4.9 10*3/uL (ref 3.4–10.8)

## 2021-07-15 LAB — BASIC METABOLIC PANEL
BUN/Creatinine Ratio: 19 (ref 12–28)
BUN: 18 mg/dL (ref 8–27)
CO2: 25 mmol/L (ref 20–29)
Calcium: 9.8 mg/dL (ref 8.7–10.3)
Chloride: 98 mmol/L (ref 96–106)
Creatinine, Ser: 0.96 mg/dL (ref 0.57–1.00)
Glucose: 102 mg/dL — ABNORMAL HIGH (ref 70–99)
Potassium: 4 mmol/L (ref 3.5–5.2)
Sodium: 137 mmol/L (ref 134–144)
eGFR: 60 mL/min/{1.73_m2} (ref >59)

## 2021-07-17 NOTE — Progress Notes (Signed)
Called pt to inform her about her lab results. Pt understood

## 2021-07-21 ENCOUNTER — Ambulatory Visit: Payer: PPO | Admitting: Adult Health

## 2021-07-22 ENCOUNTER — Ambulatory Visit (HOSPITAL_COMMUNITY): Payer: PPO | Admitting: Certified Registered Nurse Anesthetist

## 2021-07-22 ENCOUNTER — Ambulatory Visit (HOSPITAL_COMMUNITY)
Admission: RE | Admit: 2021-07-22 | Discharge: 2021-07-22 | Disposition: A | Discharge status: Home / Self Care | Payer: PPO | Source: Ambulatory Visit | Attending: Cardiology | Admitting: Cardiology

## 2021-07-22 ENCOUNTER — Other Ambulatory Visit: Payer: Self-pay

## 2021-07-22 ENCOUNTER — Encounter (HOSPITAL_COMMUNITY)
Admission: RE | Disposition: A | Discharge status: Home / Self Care | Payer: Self-pay | Source: Ambulatory Visit | Attending: Cardiology

## 2021-07-22 ENCOUNTER — Encounter (HOSPITAL_COMMUNITY): Payer: Self-pay | Admitting: Cardiology

## 2021-07-22 DIAGNOSIS — I4891 Unspecified atrial fibrillation: Secondary | ICD-10-CM | POA: Diagnosis not present

## 2021-07-22 DIAGNOSIS — G4730 Sleep apnea, unspecified: Secondary | ICD-10-CM | POA: Insufficient documentation

## 2021-07-22 DIAGNOSIS — Z853 Personal history of malignant neoplasm of breast: Secondary | ICD-10-CM | POA: Diagnosis not present

## 2021-07-22 DIAGNOSIS — Z9989 Dependence on other enabling machines and devices: Secondary | ICD-10-CM | POA: Diagnosis not present

## 2021-07-22 DIAGNOSIS — I10 Essential (primary) hypertension: Secondary | ICD-10-CM | POA: Diagnosis not present

## 2021-07-22 DIAGNOSIS — Z79899 Other long term (current) drug therapy: Secondary | ICD-10-CM | POA: Insufficient documentation

## 2021-07-22 DIAGNOSIS — I4819 Other persistent atrial fibrillation: Secondary | ICD-10-CM | POA: Diagnosis not present

## 2021-07-22 DIAGNOSIS — Z7901 Long term (current) use of anticoagulants: Secondary | ICD-10-CM | POA: Insufficient documentation

## 2021-07-22 DIAGNOSIS — G4733 Obstructive sleep apnea (adult) (pediatric): Secondary | ICD-10-CM | POA: Diagnosis not present

## 2021-07-22 DIAGNOSIS — E785 Hyperlipidemia, unspecified: Secondary | ICD-10-CM | POA: Diagnosis not present

## 2021-07-22 HISTORY — PX: CARDIOVERSION: SHX1299

## 2021-07-22 SURGERY — CARDIOVERSION
Anesthesia: General

## 2021-07-22 MED ORDER — PROPOFOL 10 MG/ML IV BOLUS
INTRAVENOUS | Status: DC | PRN
  Administered 2021-07-22: 40 mg via INTRAVENOUS
  Administered 2021-07-22: 20 mg via INTRAVENOUS

## 2021-07-22 MED ORDER — SODIUM CHLORIDE 0.9 % IV SOLN: INTRAVENOUS | Status: DC

## 2021-07-22 MED ORDER — LIDOCAINE 2% (20 MG/ML) 5 ML SYRINGE
INTRAMUSCULAR | Status: DC | PRN
  Administered 2021-07-22: 40 mg via INTRAVENOUS

## 2021-07-22 MED ORDER — AMLODIPINE BESYLATE 5 MG PO TABS: 5.0000 mg | ORAL_TABLET | Freq: Every day | ORAL | 0 refills | Status: DC

## 2021-07-22 NOTE — Anesthesia Procedure Notes (Signed)
Procedure Name: General with mask airway Date/Time: 07/22/2021 12:46 PM Performed by: Janene Harvey, CRNA Pre-anesthesia Checklist: Patient identified, Emergency Drugs available, Suction available and Patient being monitored Patient Re-evaluated:Patient Re-evaluated prior to induction Oxygen Delivery Method: Ambu bag Induction Type: IV induction Placement Confirmation: positive ETCO2 Dental Injury: Teeth and Oropharynx as per pre-operative assessment

## 2021-07-22 NOTE — CV Procedure (Signed)
Direct current cardioversion:  Indications:  Atrial Fibrillation  Procedure Details:  Consent: Risks of procedure as well as the alternatives and risks of each were explained to the (patient/caregiver).  Consent for procedure obtained.  Time Out: Verified patient identification, verified procedure, site/side was marked, verified correct patient position, special equipment/implants available, medications/allergies/relevent history reviewed, required imaging and test results available. PERFORMED.  Patient placed on cardiac monitor, pulse oximetry, supplemental oxygen as necessary.  Sedation given:  See anesthesia records.  Pacer pads placed anterior and posterior chest.  Cardioverted 1 time(s).  Cardioversion with synchronized biphasic 200J shock.  Evaluation: Findings: Post procedure EKG shows: NSR Complications: None Patient did tolerate procedure well. Patient states her home BP ranges between 120-147mmHg. She was hypertensive on presentation. Will start Norvasc 5mg  po qday and re-evaluate in 2 week.  Updated family.  Obtain post-procedure EKG   Kelly Blackwell 07/22/2021, 12:52 PM

## 2021-07-22 NOTE — H&P (Signed)
HISTORY AND PHYSICAL  Patient ID: Kelly Blackwell MRN: [id number] DOB/AGE: [birth year] 81 y.o.  Admit date: 07/22/2021 Attending physician: Monica Martinez Primary Physician:  Donnajean Lopes, MD  Chief complaint: Elective cardioversion  HPI:  Kelly Blackwell is a 81 y.o. female who presents with a chief complaint of "cardioversion." Her past medical history and cardiovascular risk factors include: Sleep Apnea, atrial fibrillation, hypertension, hyperlipidemia, history of breast cancer, postmenopausal female, advanced age.  Due to symptomatic atrial fibrillation patient is scheduled for elective cardioversion today.   No changes in medication or medical history since last office visit on 06/18/2021.   ALLERGIES: Allergies  Allergen Reactions   Sulfa Antibiotics Rash and Other (See Comments)    Fever and rash     PAST MEDICAL HISTORY: Past Medical History:  Diagnosis Date   Allergy    seasonal allergies.    Anxiety    Arthritis    Neck   Atrial fibrillation ([place])    Breast cancer ([facility])    Hypercholesteremia    Hypertension    OSA on CPAP 12/2019   Osteopenia    Personal history of radiation therapy     PAST SURGICAL HISTORY: Past Surgical History:  Procedure Laterality Date   APPENDECTOMY     age 34   BREAST LUMPECTOMY Right 08/03/2018   BREAST LUMPECTOMY WITH RADIOACTIVE SEED AND SENTINEL LYMPH NODE BIOPSY Right 08/03/2018   Procedure: RIGHT BREAST LUMPECTOMY WITH RADIOACTIVE SEED AND SENTINEL LYMPH NODE BIOPSY WITH BLUE DYE INJECTION;  Surgeon: Donnie Mesa, MD;  Location: [facility];  Service: General;  Laterality: Right;   bunionectmy     1986   CARDIOVERSION N/A 09/03/2020   Procedure: CARDIOVERSION;  Surgeon: Rex Kras, DO;  Location: [facility];  Service: Cardiovascular;  Laterality: N/A;   CHOLECYSTECTOMY     age 31   TONSILLECTOMY      FAMILY HISTORY: The patient family history includes AAA (abdominal aortic aneurysm) in her father; Atrial  fibrillation in her mother; Colon cancer in her father; Coronary artery disease in her father; Heart failure in her mother; Hypertension in her mother; Osteoporosis in her mother.   SOCIAL HISTORY:  The patient  reports that she has never smoked. She has never used smokeless tobacco. She reports that she does not currently use alcohol. She reports that she does not use drugs.  MEDICATIONS: Current Outpatient Medications  Medication Instructions   acetaminophen (TYLENOL) 500-1,000 mg, Oral, Every 6 hours PRN   ALPRAZolam (XANAX) 0.25 mg, Oral, See admin instructions, Take 1 tablet (0.25 mg) by mouth scheduled at night for sleep; may take an additional dose during the day if needed for anxiety   anastrozole (ARIMIDEX) 1 mg, Oral, Daily   atorvastatin (LIPITOR) 10 mg, Oral, Every morning   Calcium Carb-Cholecalciferol (CALCIUM 600 + D PO) 1 tablet, Oral, 2 times daily   cetirizine (ZYRTEC) 10 mg, Oral, Daily   cholecalciferol (VITAMIN D3) 1,000 Units, Oral, Every morning   ELIQUIS 5 MG TABS tablet TAKE 1 TABLET BY MOUTH TWICE A DAY   flecainide (TAMBOCOR) 100 mg, Oral, 2 times daily   methocarbamol (ROBAXIN) 250 mg, Oral, Daily at bedtime, Leg cramps   metoprolol succinate (TOPROL-XL) 25 MG 24 hr tablet TAKE 1 TABLET BY MOUTH EVERY DAY   nitroGLYCERIN (NITROSTAT) 0.4 MG SL tablet DISSOLVE 1 TABLET UNDER THE TONGUE EVERY 5 MINUTES AS NEEDED FOR CHEST PAIN. IF YOU REQUIRE MORE THAN TWO TABLETS FIVE MINUTES APART GO TO THE NEAREST ER VIA EMS.  omeprazole (PRILOSEC) 20 mg, Oral, Daily   telmisartan-hydrochlorothiazide (MICARDIS HCT) 80-12.5 MG tablet 1 tablet, Oral, Daily     sodium chloride      REVIEW OF SYSTEMS: Review of Systems  Constitutional: Positive for malaise/fatigue. Negative for chills and fever.  HENT:  Negative for hoarse voice and nosebleeds.   Eyes:  Negative for discharge, double vision and pain.  Cardiovascular:  Positive for dyspnea on exertion. Negative for chest pain,  claudication, leg swelling, near-syncope, orthopnea, palpitations, paroxysmal nocturnal dyspnea and syncope.  Respiratory:  Negative for hemoptysis and shortness of breath.   Musculoskeletal:  Negative for muscle cramps and myalgias.  Gastrointestinal:  Negative for abdominal pain, constipation, diarrhea, hematemesis, hematochezia, melena, nausea and vomiting.  Neurological:  Negative for dizziness and light-headedness.  All other systems reviewed and are negative.  PHYSICAL EXAM: Vitals with BMI 07/22/2021 06/18/2021 05/25/2021  Height 5' 1.5" 5\' 1"  5\' 1"   Weight 125 lbs 126 lbs 13 oz 127 lbs 6 oz  BMI 23.24 29.92 42.68  Systolic 341 962 229  Diastolic 91 70 80  Pulse 75 60 57    No intake or output data in the 24 hours ending 07/22/21 1232  Net IO Since Admission: No IO data has been entered for this period [07/22/21 1232] CONSTITUTIONAL: Well-developed and well-nourished. No acute distress.  SKIN: Skin is warm and dry. No rash noted. No cyanosis. No pallor. No jaundice HEAD: Normocephalic and atraumatic.  EYES: No scleral icterus MOUTH/THROAT: Moist oral membranes.  NECK: No JVD present. No thyromegaly noted. No carotid bruits  LYMPHATIC: No visible cervical adenopathy.  CHEST Normal respiratory effort. No intercostal retractions  LUNGS: Clear to auscultation bilaterally.  No stridor. No wheezes. No rales.  CARDIOVASCULAR: Irregularly irregular, variable S1-S2, no murmurs rubs or gallops appreciated ABDOMINAL: No apparent ascites.  EXTREMITIES: No peripheral edema, warm to touch.  HEMATOLOGIC: No significant bruising NEUROLOGIC: Oriented to person, place, and time. Nonfocal. Normal muscle tone.  PSYCHIATRIC: Normal mood and affect. Normal behavior. Cooperative  RADIOLOGY: No results found.  LABORATORY DATA: Lab Results  Component Value Date   WBC 4.9 07/14/2021   HGB 14.4 07/14/2021   HCT 43.9 07/14/2021   MCV 88 07/14/2021   PLT 221 07/14/2021   No results for input(s):  NA, K, CL, CO2, BUN, CREATININE, CALCIUM, PROT, BILITOT, ALKPHOS, ALT, AST, GLUCOSE in the last 168 hours.  Invalid input(s): LABALBU  Lipid Panel  No results found for: CHOL, HDL, LDLCALC, LDLDIRECT, TRIG, CHOLHDL  BNP (last 3 results) No results for input(s): BNP in the last 8760 hours.  HEMOGLOBIN A1C No results found for: HGBA1C, MPG  Cardiac Panel (last 3 results) No results for input(s): CKTOTAL, CKMB, RELINDX in the last 8760 hours.  Invalid input(s): TROPONINHS  No results found for: CKTOTAL, CKMB, CKMBINDEX   TSH No results for input(s): TSH in the last 8760 hours.    CARDIAC DATABASE: EKG: 11/15/2019: Normal sinus rhythm, 79 bpm, normal axis, ST depressions in the inferior and lateral leads suggestive of possible ischemia.  Prior EKG dated 07/31/2018 noted normal sinus with PVCs and nonspecific ST abnormality.   09/03/2020: Normal sinus rhythm, 67 bpm, normal axis, first-degree AV block.   06/18/2021: Atrial fibrillation, 68 bpm, poor R wave progression, without underlying injury pattern.    Echocardiogram: 11/21/2019: LVEF 60 to 65%, mild LVH, indeterminate diastolic filling pattern, elevated LAP, mild MR, mild TR.   Stress Testing: Lexiscan (Walking with mod Bruce)Tetrofosmin Stress Test  11/19/2019:  Myocardial perfusion is normal.  Overall LV systolic function is normal without regional wall motion abnormalities.  Stress LV EF: 61%.  No previous exam available for comparison. Low risk study.    Heart Catheterization: None  Procedures: 09/03/2020: Direct-current cardioversion, 200 J x 1, atrial fibrillation converted to normal sinus rhythm.  IMPRESSION & RECOMMENDATIONS: Kelly Blackwell is a 81 y.o. Caucasian female whose past medical history and cardiovascular risk factors include: Sleep Apnea, atrial fibrillation, hypertension, hyperlipidemia, history of breast cancer, postmenopausal female, advanced age.  Atrial fibrillation: Persistent Rate control:  Toprol-XL. Rhythm control: Flecainide Thromboembolic prophylaxis: Eliquis  CHA2DS2-VASc SCORE is 52 (age >57, HTN, female gender) which correlates to 4.0 % risk of stroke per year.  Last direct-current cardioversion in February 2022 and converted to normal sinus after 200 J x 1 but shortly thereafter went back into atrial fibrillation.  Since last visit has been on flecainide and is currently taking 100 mg p.o. twice daily. [place]dueled for elective cardioversion today. Patient verbally confirms that she has not missed Eliquis doses over the last 4 weeks.  Risks, benefits, and alternatives of direct current cardioversion reviewed with the and patient. Risk includes but not limited to: potential for post-cardioversion rhythms, life-threatening arrhythmias (ventricular tachycardia and fibrillation, profound bradycardia, cardiac arrest) needing cardiopulmonary resuscitation intervention, myocardial damage, acute pulmonary edema, skin burns, transient hypotension. Benefits include restoration of sinus rhythm. Alternatives to treatment were discussed, questions were answered, patient voices understanding and provides verbal feedback.  Patient is willing to proceed.    Long-term oral anticoagulation: Indication: Atrial fibrillation. Does not endorse any evidence of bleeding.  Consultants: None   Code Status: Full    Family Communication:  Son and husband (in the waiting room).    Disposition Plan: Home.   Patient's questions and concerns were addressed to her satisfaction. She voices understanding of the instructions provided during this encounter.   This note was created using a voice recognition software as a result there may be grammatical errors inadvertently enclosed that do not reflect the nature of this encounter. Every attempt is made to correct such errors.  Mechele Claude [facility]  Pager: [phone] Office: [phone] 07/22/2021, 12:32 PM

## 2021-07-22 NOTE — Discharge Instructions (Addendum)
Start Norvasc - script sent to pharmacy.  Keep a log of BP  Low salt diet.  No driving or operating machinery for 24hr post-anesthesia.   Electrical Cardioversion  Electrical cardioversion is the delivery of a jolt of electricity to restore a normal rhythm to the heart. A rhythm that is too fast or is not regular keeps the heart from pumping well. In this procedure, sticky patches or metal paddles are placed on the chest to deliver electricity to the heart from a device.  If this information does not answer your questions, please call [facility] office at [phone] to clarify.   Follow these instructions at home: You may have some redness on the skin where the shocks were given.  You may apply over-the-counter hydrocortisone cream or aloe vera to alleviate skin irritation. YOU SHOULD NOT DRIVE, use power tools, machinery or perform tasks that involve climbing or major physical exertion for 24 hours (because of the sedation medicines used during the test).  Take over-the-counter and prescription medicines only as told by your health care provider. Ask your health care provider how to check your pulse. Check it often. Rest for 48 hours after the procedure or as told by your health care provider. Avoid or limit your caffeine use as told by your health care provider. Keep all follow-up visits as told by your health care provider. This is important.  FOLLOW UP:  Please also call with any specific questions about appointments or follow up tests.

## 2021-07-22 NOTE — Transfer of Care (Signed)
Immediate Anesthesia Transfer of Care Note  Patient: Kelly Blackwell  Procedure(s) Performed: CARDIOVERSION  Patient Location: Endoscopy Unit  Anesthesia Type:General  Level of Consciousness: drowsy and patient cooperative  Airway & Oxygen Therapy: Patient Spontanous Breathing  Post-op Assessment: Report given to RN and Post -op Vital signs reviewed and stable  Post vital signs: Reviewed and stable  Last Vitals:  Vitals Value Taken Time  BP 178/79   Temp    Pulse 57   Resp 13   SpO2 100%     Last Pain:  Vitals:   07/22/21 1119  TempSrc: Temporal  PainSc: 0-No pain         Complications: No notable events documented.

## 2021-07-23 ENCOUNTER — Ambulatory Visit
Admission: RE | Admit: 2021-07-23 | Discharge: 2021-07-23 | Disposition: A | Discharge status: Home / Self Care | Payer: PPO | Source: Ambulatory Visit | Attending: Hematology and Oncology | Admitting: Hematology and Oncology

## 2021-07-23 DIAGNOSIS — R922 Inconclusive mammogram: Secondary | ICD-10-CM | POA: Diagnosis not present

## 2021-07-23 DIAGNOSIS — Z9889 Other specified postprocedural states: Secondary | ICD-10-CM

## 2021-07-24 ENCOUNTER — Encounter (HOSPITAL_COMMUNITY): Payer: Self-pay | Admitting: Cardiology

## 2021-07-27 NOTE — Anesthesia Postprocedure Evaluation (Signed)
Anesthesia Post Note  Patient: Kelly Blackwell  Procedure(s) Performed: CARDIOVERSION     Patient location during evaluation: Endoscopy Anesthesia Type: General Level of consciousness: awake and alert Pain management: pain level controlled Vital Signs Assessment: post-procedure vital signs reviewed and stable Respiratory status: spontaneous breathing, nonlabored ventilation, respiratory function stable and patient connected to nasal cannula oxygen Cardiovascular status: blood pressure returned to baseline and stable Postop Assessment: no apparent nausea or vomiting Anesthetic complications: no   No notable events documented.  Last Vitals:  Vitals:   07/22/21 1313 07/22/21 1323  BP: (!) 169/71 (!) 181/69  Pulse: (!) 55 (!) 54  Resp: 16 15  Temp:    SpO2: 100% 99%    Last Pain:  Vitals:   07/22/21 1323  TempSrc:   PainSc: 0-No pain                 Wynne Jury

## 2021-07-27 NOTE — Anesthesia Preprocedure Evaluation (Signed)
Anesthesia Evaluation  Patient identified by MRN, date of birth, ID band Patient awake    Reviewed: Allergy & Precautions, H&P , NPO status , Patient's Chart, lab work & pertinent test results  Airway Mallampati: II  TM Distance: >3 FB Neck ROM: Full    Dental  (+) Teeth Intact, Dental Advisory Given   Pulmonary sleep apnea ,    breath sounds clear to auscultation       Cardiovascular hypertension, Pt. on medications and Pt. on home beta blockers + dysrhythmias Atrial Fibrillation  Rhythm:Irregular Rate:Normal     Neuro/Psych Anxiety negative neurological ROS     GI/Hepatic negative GI ROS, Neg liver ROS,   Endo/Other  negative endocrine ROS  Renal/GU negative Renal ROS  negative genitourinary   Musculoskeletal  (+) Arthritis , Osteoarthritis,    Abdominal   Peds  Hematology negative hematology ROS (+)   Anesthesia Other Findings   Reproductive/Obstetrics negative OB ROS                             Anesthesia Physical Anesthesia Plan  ASA: 3  Anesthesia Plan: General   Post-op Pain Management: Minimal or no pain anticipated   Induction: Intravenous  PONV Risk Score and Plan: 3 and Treatment may vary due to age or medical condition  Airway Management Planned: Mask, Natural Airway and Nasal Cannula  Additional Equipment: None  Intra-op Plan:   Post-operative Plan:   Informed Consent: I have reviewed the patients History and Physical, chart, labs and discussed the procedure including the risks, benefits and alternatives for the proposed anesthesia with the patient or authorized representative who has indicated his/her understanding and acceptance.     Dental advisory given  Plan Discussed with: CRNA and Anesthesiologist  Anesthesia Plan Comments:         Anesthesia Quick Evaluation

## 2021-08-11 ENCOUNTER — Encounter: Payer: Self-pay | Admitting: Cardiology

## 2021-08-11 ENCOUNTER — Other Ambulatory Visit: Payer: Self-pay

## 2021-08-11 ENCOUNTER — Ambulatory Visit: Payer: PPO | Admitting: Cardiology

## 2021-08-11 VITALS — BP 143/58 | HR 57 | Temp 98.7°F | Resp 18 | Ht 61.5 in | Wt 125.2 lb

## 2021-08-11 DIAGNOSIS — I480 Paroxysmal atrial fibrillation: Secondary | ICD-10-CM | POA: Diagnosis not present

## 2021-08-11 DIAGNOSIS — Z9989 Dependence on other enabling machines and devices: Secondary | ICD-10-CM | POA: Diagnosis not present

## 2021-08-11 DIAGNOSIS — G4733 Obstructive sleep apnea (adult) (pediatric): Secondary | ICD-10-CM | POA: Diagnosis not present

## 2021-08-11 DIAGNOSIS — E782 Mixed hyperlipidemia: Secondary | ICD-10-CM | POA: Diagnosis not present

## 2021-08-11 DIAGNOSIS — Z7901 Long term (current) use of anticoagulants: Secondary | ICD-10-CM

## 2021-08-11 DIAGNOSIS — Z79899 Other long term (current) drug therapy: Secondary | ICD-10-CM

## 2021-08-11 DIAGNOSIS — I10 Essential (primary) hypertension: Secondary | ICD-10-CM | POA: Diagnosis not present

## 2021-08-11 DIAGNOSIS — Z9889 Other specified postprocedural states: Secondary | ICD-10-CM

## 2021-08-11 MED ORDER — HYDROCHLOROTHIAZIDE 25 MG PO TABS: 25.0000 mg | ORAL_TABLET | Freq: Every morning | ORAL | 0 refills | Status: DC

## 2021-08-11 MED ORDER — AMLODIPINE BESYLATE 5 MG PO TABS: 5.0000 mg | ORAL_TABLET | Freq: Every morning | ORAL | 0 refills | Status: DC

## 2021-08-11 MED ORDER — LOSARTAN POTASSIUM 50 MG PO TABS
50.0000 mg | ORAL_TABLET | Freq: Every day | ORAL | 0 refills | Status: DC
Start: 2021-08-11 — End: 2021-11-04

## 2021-08-11 NOTE — Progress Notes (Signed)
Kelly Blackwell Date of Birth: October 11, 1940 MRN: [id number] Cardiologist:  Rex Kras, DO, [facility] (established care 11/15/2019) Former Cardiology Providers: Dr. Adrian Prows  Date: 08/11/21 Last Office Visit: 06/18/2021  Chief Complaint  Patient presents with   Atrial Fibrillation   Follow-up    2 weeks   HPI  Kelly Blackwell is a 81 y.o. female who presents to the office with a  chief complaint of " 2-week follow-up status post cardioversion."   Her past medical history and cardiovascular risk factors are: Sleep Apnea, atrial fibrillation, hypertension, hyperlipidemia, history of breast cancer, postmenopausal female, advanced age.   Patient was originally referred to the office for evaluation of chest pain in April 2021.   November 2021 patient came in for routine visit was noted to have atrial fibrillation on surface ECG.  Initially proceeded with rate control strategy which was unsuccessful.  She underwent direct-current cardioversion in February 2022 which was successful at first; however, at a 2-week follow-up visit she was back in A. fib.  At that time patient wanted to continue current medical therapy without the consideration of antiarrhythmic medications or EP evaluation.  Patient came back for reevaluation due to her symptoms of feeling tired, fatigue, and having shortness of breath with exertional activities.  She was uptitrated on flecainide and since that she continues to be in A. fib she had decision was to proceed with direct-current cardioversion.  She underwent direct-current cardioversion on 07/22/2021 which restored normal sinus rhythm after 200 J x 1.  She now presents for 2-week follow-up.  Symptomatically she is not having shortness of breath with regular activities, feeling less tired and fatigued, and feels good overall.  Surface ECG confirmed sinus rhythm.   FUNCTIONAL STATUS: She walks 5 days a week for 30-45 minutes.    ALLERGIES: Allergies  Allergen Reactions    Sulfa Antibiotics Rash and Other (See Comments)    Fever and rash     MEDICATION LIST PRIOR TO VISIT: Current Meds  Medication Sig   acetaminophen (TYLENOL) 500 MG tablet Take 500-1,000 mg by mouth every 6 (six) hours as needed (pain).   ALPRAZolam (XANAX) 0.25 MG tablet Take 0.25 mg by mouth See admin instructions. Take 1 tablet (0.25 mg) by mouth scheduled at night for sleep; may take an additional dose during the day if needed for anxiety   anastrozole (ARIMIDEX) 1 MG tablet Take 1 tablet (1 mg total) by mouth daily.   atorvastatin (LIPITOR) 10 MG tablet Take 10 mg by mouth in the morning.   Calcium Carb-Cholecalciferol (CALCIUM 600 + D PO) Take 1 tablet by mouth 2 (two) times daily.   cetirizine (ZYRTEC) 10 MG tablet Take 10 mg by mouth daily.   cholecalciferol (VITAMIN D3) 25 MCG (1000 UT) tablet Take 1,000 Units by mouth in the morning.   ELIQUIS 5 MG TABS tablet TAKE 1 TABLET BY MOUTH TWICE A DAY   flecainide (TAMBOCOR) 100 MG tablet Take 1 tablet (100 mg total) by mouth 2 (two) times daily.   hydrochlorothiazide (HYDRODIURIL) 25 MG tablet Take 1 tablet (25 mg total) by mouth every morning.   losartan (COZAAR) 50 MG tablet Take 1 tablet (50 mg total) by mouth daily at 10 pm.   methocarbamol (ROBAXIN) 500 MG tablet Take 250 mg by mouth at bedtime. Leg cramps   metoprolol succinate (TOPROL-XL) 25 MG 24 hr tablet TAKE 1 TABLET BY MOUTH EVERY DAY (Patient taking differently: 25 mg daily with supper.)   nitroGLYCERIN (NITROSTAT) 0.4 MG  SL tablet DISSOLVE 1 TABLET UNDER THE TONGUE EVERY 5 MINUTES AS NEEDED FOR CHEST PAIN. IF YOU REQUIRE MORE THAN TWO TABLETS FIVE MINUTES APART GO TO THE NEAREST ER VIA EMS. (Patient taking differently: Place 0.4 mg under the tongue every 5 (five) minutes x 3 doses as needed for chest pain.)   omeprazole (PRILOSEC) 20 MG capsule Take 20 mg by mouth daily.   [DISCONTINUED] amLODipine (NORVASC) 5 MG tablet Take 1 tablet (5 mg total) by mouth daily at 10 pm.    [DISCONTINUED] telmisartan-hydrochlorothiazide (MICARDIS HCT) 80-12.5 MG tablet Take 1 tablet by mouth daily.     PAST MEDICAL HISTORY: Past Medical History:  Diagnosis Date   Allergy    seasonal allergies.    Anxiety    Arthritis    Neck   Atrial fibrillation (HCC)    Breast cancer (HCC)    Hypercholesteremia    Hypertension    OSA on CPAP 12/2019   Osteopenia    Personal history of radiation therapy     PAST SURGICAL HISTORY: Past Surgical History:  Procedure Laterality Date   APPENDECTOMY     age 38   BREAST LUMPECTOMY Right 08/03/2018   BREAST LUMPECTOMY WITH RADIOACTIVE SEED AND SENTINEL LYMPH NODE BIOPSY Right 08/03/2018   Procedure: RIGHT BREAST LUMPECTOMY WITH RADIOACTIVE SEED AND SENTINEL LYMPH NODE BIOPSY WITH BLUE DYE INJECTION;  Surgeon: Donnie Mesa, MD;  Location: [place];  Service: General;  Laterality: Right;   bunionectmy     1986   CARDIOVERSION N/A 09/03/2020   Procedure: CARDIOVERSION;  Surgeon: Rex Kras, DO;  Location: [place];  Service: Cardiovascular;  Laterality: N/A;   CARDIOVERSION N/A 07/22/2021   Procedure: CARDIOVERSION;  Surgeon: Rex Kras, DO;  Location: [facility];  Service: Cardiovascular;  Laterality: N/A;   CHOLECYSTECTOMY     age 34   TONSILLECTOMY      FAMILY HISTORY: The patient family history includes AAA (abdominal aortic aneurysm) in her father; Atrial fibrillation in her mother; Colon cancer in her father; Coronary artery disease in her father; Heart failure in her mother; Hypertension in her mother; Osteoporosis in her mother.  SOCIAL HISTORY:  The patient  reports that she has never smoked. She has never used smokeless tobacco. She reports that she does not currently use alcohol. She reports that she does not use drugs.  REVIEW OF SYSTEMS: Review of Systems  Constitutional: Negative for chills, fever and malaise/fatigue.  HENT:  Negative for hoarse voice and nosebleeds.   Eyes:  Negative for discharge, double  vision and pain.  Cardiovascular:  Negative for chest pain, claudication, dyspnea on exertion, leg swelling, near-syncope, orthopnea, palpitations, paroxysmal nocturnal dyspnea and syncope.  Respiratory:  Negative for hemoptysis and shortness of breath.   Musculoskeletal:  Negative for muscle cramps and myalgias.  Gastrointestinal:  Negative for abdominal pain, constipation, diarrhea, hematemesis, hematochezia, melena, nausea and vomiting.  Neurological:  Negative for dizziness and light-headedness.   PHYSICAL EXAM: Vitals with BMI 08/11/2021 07/22/2021 07/22/2021  Height 5' 1.5" - -  Weight 125 lbs 3 oz - -  BMI 47.65 - -  Systolic 465 035 465  Diastolic 58 69 71  Pulse 57 54 55   CONSTITUTIONAL: Well-developed and well-nourished. No acute distress.  SKIN: Skin is warm and dry. No rash noted. No cyanosis. No pallor. No jaundice HEAD: Normocephalic and atraumatic.  EYES: No scleral icterus MOUTH/THROAT: Moist oral membranes.  NECK: No JVD present. No thyromegaly noted. No carotid bruits  LYMPHATIC: No visible cervical adenopathy.  CHEST Normal respiratory effort. No intercostal retractions  LUNGS: Clear to auscultation bilaterally.  No stridor. No wheezes. No rales.  CARDIOVASCULAR: Bradycardic, S1-S2, no murmurs rubs or gallops appreciated ABDOMINAL: No apparent ascites.  EXTREMITIES: No peripheral edema, warm to touch.  HEMATOLOGIC: No significant bruising NEUROLOGIC: Oriented to person, place, and time. Nonfocal. Normal muscle tone.  PSYCHIATRIC: Normal mood and affect. Normal behavior. Cooperative  CARDIAC DATABASE: EKG: 11/15/2019: Normal sinus rhythm, 79 bpm, normal axis, ST depressions in the inferior and lateral leads suggestive of possible ischemia.  Prior EKG dated 07/31/2018 noted normal sinus with PVCs and nonspecific ST abnormality.  09/03/2020: Normal sinus rhythm, 67 bpm, normal axis, first-degree AV block.  06/18/2021: Atrial fibrillation, 68 bpm, poor R wave progression,  without underlying injury pattern.   08/11/2021: Sinus bradycardia, 57 bpm, first-degree AV block, without underlying ischemia or injury pattern.   Echocardiogram: 11/21/2019: LVEF 60 to 65%, mild LVH, indeterminate diastolic filling pattern, elevated LAP, mild MR, mild TR.  Stress Testing: Lexiscan (Walking with mod Bruce)Tetrofosmin Stress Test  11/19/2019:  Myocardial perfusion is normal.  Overall LV systolic function is normal without regional wall motion abnormalities.  Stress LV EF: 61%.  No previous exam available for comparison. Low risk study.   Heart Catheterization: None  Procedures: 09/03/2020: Direct-current cardioversion, 200 J x 1, atrial fibrillation converted to normal sinus rhythm.  LABORATORY DATA: CBC Latest Ref Rng & Units 07/14/2021 05/21/2021 03/31/2021  WBC 3.4 - 10.8 x10E3/uL 4.9 - -  Hemoglobin 11.1 - 15.9 g/dL 14.4 14.8 14.2  Hematocrit 34.0 - 46.6 % 43.9 43.1 41.8  Platelets 150 - 450 x10E3/uL 221 - -    CMP Latest Ref Rng & Units 07/14/2021 05/21/2021 03/31/2021  Glucose 70 - 99 mg/dL 102(H) 60(L) 111(H)  BUN 8 - 27 mg/dL 18 19 18   Creatinine 0.57 - 1.00 mg/dL 0.96 0.85 0.98  Sodium 134 - 144 mmol/L 137 139 137  Potassium 3.5 - 5.2 mmol/L 4.0 4.3 4.0  Chloride 96 - 106 mmol/L 98 98 98  CO2 20 - 29 mmol/L 25 26 24   Calcium 8.7 - 10.3 mg/dL 9.8 10.2 9.8  Total Protein 6.0 - 8.5 g/dL - 7.1 -  Total Bilirubin 0.0 - 1.2 mg/dL - 0.5 -  Alkaline Phos 44 - 121 IU/L - 72 -  AST 0 - 40 IU/L - 29 -  ALT 0 - 32 IU/L - 26 -   External Labs: Collected: 10/11/2019 Creatinine 0.8 mg/dL. eGFR: 69 mL/min per 1.73 m Lipid profile: Total cholesterol 169, triglycerides 54, HDL 79, LDL 79, non-HDL 90 Hemoglobin A1c: None  IMPRESSION:    ICD-10-CM   1. Paroxysmal atrial fibrillation (HCC)  I48.0 EKG 12-Lead    2. Long term (current) use of anticoagulants  Z79.01     3. Long term current use of antiarrhythmic drug  Z79.899     4. History of cardioversion   Z98.890     5. Benign hypertension  I10 amLODipine (NORVASC) 5 MG tablet    hydrochlorothiazide (HYDRODIURIL) 25 MG tablet    losartan (COZAAR) 50 MG tablet    Basic metabolic panel    6. Mixed hyperlipidemia  E78.2     7. OSA on CPAP  G47.33    Z99.89        RECOMMENDATIONS: AYSE MCCARTIN is a 81 y.o. female whose past medical history and cardiac risk factors include:Sleep Apnea, atrial fibrillation, hypertension, hyperlipidemia, history of breast cancer, postmenopausal female, advanced age.   Paroxysmal atrial fibrillation (HCC)/direct-current  cardioversion x2 Rate control: Toprol-XL. Rhythm control: Flecainide. Thromboembolic prophylaxis Eliquis. CHA2DS2-VASc SCORE is 43 (age >9, HTN, female gender) which correlates to 4.0 % risk of stroke per year.  Direct-current cardioversion in February 2022 converted to normal sinus rhythm after 200 J x 1 and shortly thereafter reverted back to A. fib. After starting flecainide she underwent direct-current cardioversion January 2023 and converted to normal sinus rhythm after 200 J x 1. She presents for 2-week follow-up and remains in sinus rhythm. Clinically she is less short of breath and has more energy. Continue current medical therapy  Long term (current) use of anticoagulants Indication: Atrial fibrillation. Does not endorse any evidence of bleeding. Most recent hemoglobin 14.4 g/dL 07/14/2021  Long term current use of antiarrhythmic drug Indication: Atrial fibrillation. Tolerating flecainide without any side effects or intolerances. Most recent renal function stable serum creatinine 0.96 mg/dL.  Benign hypertension At the time of the direct-current cardioversion patient's blood pressure was quite elevated with SBP greater than 180 mmHg.   I had started her on amlodipine 5 mg p.o. every afternoon which has helped improve her blood pressures.  Blood pressure log reviewed with SBP averaging 130 mmHg. However, patient states that  her telmisartan is quite expensive and would like a medication titrated.  She is requesting assistance. Discontinue telmisartan/HCTZ. Recommended to take amlodipine 5 mg p.o. every morning, HCTZ 25 mg p.o. every morning. Transition to olmesartan to losartan 50 mg p.o. every afternoon Blood work in 1 week to evaluate kidney function and electrolytes  Mixed hyperlipidemia Continue statin therapy, most recent lipid profile reviewed.  Currently managed by primary care provider.  OSA on CPAP Reinforced the importance of CPAP compliance  FINAL MEDICATION LIST END OF ENCOUNTER: Meds ordered this encounter  Medications   amLODipine (NORVASC) 5 MG tablet    Sig: Take 1 tablet (5 mg total) by mouth every morning.    Dispense:  30 tablet    Refill:  0   hydrochlorothiazide (HYDRODIURIL) 25 MG tablet    Sig: Take 1 tablet (25 mg total) by mouth every morning.    Dispense:  90 tablet    Refill:  0   losartan (COZAAR) 50 MG tablet    Sig: Take 1 tablet (50 mg total) by mouth daily at 10 pm.    Dispense:  90 tablet    Refill:  0      Medications Discontinued During This Encounter  Medication Reason   telmisartan-hydrochlorothiazide (MICARDIS HCT) 80-12.5 MG tablet Dose change   amLODipine (NORVASC) 5 MG tablet       Current Outpatient Medications:    acetaminophen (TYLENOL) 500 MG tablet, Take 500-1,000 mg by mouth every 6 (six) hours as needed (pain)., Disp: , Rfl:    ALPRAZolam (XANAX) 0.25 MG tablet, Take 0.25 mg by mouth See admin instructions. Take 1 tablet (0.25 mg) by mouth scheduled at night for sleep; may take an additional dose during the day if needed for anxiety, Disp: , Rfl:    anastrozole (ARIMIDEX) 1 MG tablet, Take 1 tablet (1 mg total) by mouth daily., Disp: 90 tablet, Rfl: 3   atorvastatin (LIPITOR) 10 MG tablet, Take 10 mg by mouth in the morning., Disp: , Rfl:    Calcium Carb-Cholecalciferol (CALCIUM 600 + D PO), Take 1 tablet by mouth 2 (two) times daily., Disp: , Rfl:     cetirizine (ZYRTEC) 10 MG tablet, Take 10 mg by mouth daily., Disp: , Rfl:    cholecalciferol (VITAMIN D3) 25 MCG (1000  UT) tablet, Take 1,000 Units by mouth in the morning., Disp: , Rfl:    ELIQUIS 5 MG TABS tablet, TAKE 1 TABLET BY MOUTH TWICE A DAY, Disp: 180 tablet, Rfl: 1   flecainide (TAMBOCOR) 100 MG tablet, Take 1 tablet (100 mg total) by mouth 2 (two) times daily., Disp: 120 tablet, Rfl: 1   hydrochlorothiazide (HYDRODIURIL) 25 MG tablet, Take 1 tablet (25 mg total) by mouth every morning., Disp: 90 tablet, Rfl: 0   losartan (COZAAR) 50 MG tablet, Take 1 tablet (50 mg total) by mouth daily at 10 pm., Disp: 90 tablet, Rfl: 0   methocarbamol (ROBAXIN) 500 MG tablet, Take 250 mg by mouth at bedtime. Leg cramps, Disp: , Rfl:    metoprolol succinate (TOPROL-XL) 25 MG 24 hr tablet, TAKE 1 TABLET BY MOUTH EVERY DAY (Patient taking differently: 25 mg daily with supper.), Disp: 90 tablet, Rfl: 1   nitroGLYCERIN (NITROSTAT) 0.4 MG SL tablet, DISSOLVE 1 TABLET UNDER THE TONGUE EVERY 5 MINUTES AS NEEDED FOR CHEST PAIN. IF YOU REQUIRE MORE THAN TWO TABLETS FIVE MINUTES APART GO TO THE NEAREST ER VIA EMS. (Patient taking differently: Place 0.4 mg under the tongue every 5 (five) minutes x 3 doses as needed for chest pain.), Disp: 100 tablet, Rfl: 1   omeprazole (PRILOSEC) 20 MG capsule, Take 20 mg by mouth daily., Disp: , Rfl:    amLODipine (NORVASC) 5 MG tablet, Take 1 tablet (5 mg total) by mouth every morning., Disp: 30 tablet, Rfl: 0  Orders Placed This Encounter  Procedures   Basic metabolic panel   EKG 32-IZTI   --Continue cardiac medications as reconciled in final medication list. --Return in about 3 months (around 11/09/2021) for Follow up, A. fib. Or sooner if needed. --Continue follow-up with your primary care physician regarding the management of your other chronic comorbid conditions.  Patient's questions and concerns were addressed to her satisfaction. She voices understanding of the  instructions provided during this encounter.   This note was created using a voice recognition software as a result there may be grammatical errors inadvertently enclosed that do not reflect the nature of this encounter. Every attempt is made to correct such errors.  Rex Kras, [place], [facility]  Pager: [phone] Office: [phone]

## 2021-08-17 ENCOUNTER — Other Ambulatory Visit: Payer: Self-pay | Admitting: Cardiology

## 2021-08-17 DIAGNOSIS — L109 Pemphigus, unspecified: Secondary | ICD-10-CM | POA: Diagnosis not present

## 2021-08-18 LAB — BASIC METABOLIC PANEL
BUN/Creatinine Ratio: 20 (ref 12–28)
BUN: 16 mg/dL (ref 8–27)
CO2: 24 mmol/L (ref 20–29)
Calcium: 10.2 mg/dL (ref 8.7–10.3)
Chloride: 91 mmol/L — ABNORMAL LOW (ref 96–106)
Creatinine, Ser: 0.8 mg/dL (ref 0.57–1.00)
Glucose: 101 mg/dL — ABNORMAL HIGH (ref 70–99)
Potassium: 4 mmol/L (ref 3.5–5.2)
Sodium: 131 mmol/L — ABNORMAL LOW (ref 134–144)
eGFR: 74 mL/min/{1.73_m2} (ref >59)

## 2021-08-19 ENCOUNTER — Other Ambulatory Visit: Payer: Self-pay | Admitting: Cardiology

## 2021-08-19 DIAGNOSIS — I10 Essential (primary) hypertension: Secondary | ICD-10-CM

## 2021-08-19 MED ORDER — SPIRONOLACTONE 25 MG PO TABS: 25.0000 mg | ORAL_TABLET | ORAL | 0 refills | Status: DC

## 2021-08-19 NOTE — Progress Notes (Signed)
At last office visit patient verbalized that telmisartan/HCTZ is expensive for her.  I have transitioned her to HCTZ and losartan.  These labs reflect that change. Blood pressure has improved and she is tolerating the medications well.  However, she has hyponatremia.  Shared decision was to stop hydrochlorothiazide.  And she will start spironolactone 25 mg p.o. every morning. A week later she will have labs done to reevaluate kidney function and electrolytes.  To prevent hyperkalemia as she is due to be on spironolactone and she is already on losartan I have asked her to stop eating her daily banana for now until the labs are done.  Kelly Girardot Hines, DO, [facility]

## 2021-08-20 ENCOUNTER — Other Ambulatory Visit: Payer: Self-pay | Admitting: Cardiology

## 2021-08-20 DIAGNOSIS — I10 Essential (primary) hypertension: Secondary | ICD-10-CM

## 2021-08-27 ENCOUNTER — Other Ambulatory Visit: Payer: Self-pay

## 2021-08-27 DIAGNOSIS — I10 Essential (primary) hypertension: Secondary | ICD-10-CM | POA: Diagnosis not present

## 2021-08-28 LAB — BASIC METABOLIC PANEL
BUN/Creatinine Ratio: 25 (ref 12–28)
BUN: 22 mg/dL (ref 8–27)
CO2: 24 mmol/L (ref 20–29)
Calcium: 10.6 mg/dL — ABNORMAL HIGH (ref 8.7–10.3)
Chloride: 97 mmol/L (ref 96–106)
Creatinine, Ser: 0.87 mg/dL (ref 0.57–1.00)
Glucose: 84 mg/dL (ref 70–99)
Potassium: 4.6 mmol/L (ref 3.5–5.2)
Sodium: 135 mmol/L (ref 134–144)
eGFR: 67 mL/min/{1.73_m2} (ref >59)

## 2021-08-28 LAB — MAGNESIUM: Magnesium: 1.8 mg/dL (ref 1.6–2.3)

## 2021-08-28 NOTE — Progress Notes (Signed)
Great and Thanks.   ST

## 2021-08-28 NOTE — Progress Notes (Signed)
Called and spoke with patient regarding her recent lab results. Patient stated that her BP's have been ok at 120's/80's, and she is feeling fine.

## 2021-09-18 NOTE — Progress Notes (Signed)
? ?Patient Care Team: ?Kelly Lopes, MD as PCP - General (Internal Medicine) ?Kelly Lose, MD as Consulting Physician (Hematology and Oncology) ?Kelly Gibson, MD as Attending Physician (Radiation Oncology) ?Kelly Mesa, MD as Consulting Physician (General Surgery) ? ?DIAGNOSIS:  ?  ICD-10-CM   ?1. Carcinoma of upper-inner quadrant of right breast in female, estrogen receptor positive ([place])  C50.211   ? Z17.0   ?  ? ? ?SUMMARY OF ONCOLOGIC HISTORY: ?Oncology History  ?Carcinoma of upper-inner quadrant of right breast in female, estrogen receptor positive ([place])  ?07/31/2018 Initial Diagnosis  ? Screening detected right breast mass at 1 o'clock position 1.1 cm, no axillary lymph nodes, biopsy revealed IDC grade 1-2, ER 100%, PR 80%, Ki-67 5%, HER-2 negative, 2+ by IHC and ratio 1.22 by FISH, T1CN0 stage Ia ?  ?07/31/2018 Cancer Staging  ? Staging form: Breast, AJCC 8th Edition ?- Clinical: Stage IA (cT1c, cN0, cM0, G1, ER+, PR+, HER2-) - Signed by Kelly Gibson, MD on 07/31/2018 ? ?  ?08/03/2018 Surgery  ? Right lumpectomy: IDC 0.6 cm, grade 2, margins negative, 0/4 lymph nodes negative, ER 100%, PR 80%, HER-2 equivocal by IHC, FISH negative ratio 1.95, Ki-67 5%, T1BN0 stage Ia ?  ?09/15/2018 - 10/10/2018 Radiation Therapy  ? Adjuvant radiation with Dr. Isidore Blackwell ?  ?10/10/2018 -  Anti-estrogen oral therapy  ? Anastrozole, 32m daily, plan for 5 years ?  ? ? ?CHIEF COMPLIANT: Follow-up of right breast cancer on anastrozole ? ?INTERVAL HISTORY: CMIASHA EMMONSis a 81y.o. with above-mentioned history of right breast cancer treated with lumpectomy, adjuvant radiation, and who is currently on antiestrogen therapy with anastrozole. Mammogram on 07/23/2021 showed no evidence of malignancy bilaterally. She presents to the clinic today for follow-up.  She reports no new side effects or concerns with anastrozole therapy.  She had a cardioversion and since then she has been feeling much better. ? ?ALLERGIES:  is allergic to  sulfa antibiotics. ? ?MEDICATIONS:  ?Current Outpatient Medications  ?Medication Sig Dispense Refill  ? acetaminophen (TYLENOL) 500 MG tablet Take 500-1,000 mg by mouth every 6 (six) hours as needed (pain).    ? ALPRAZolam (XANAX) 0.25 MG tablet Take 0.25 mg by mouth See admin instructions. Take 1 tablet (0.25 mg) by mouth scheduled at night for sleep; may take an additional dose during the day if needed for anxiety    ? amLODipine (NORVASC) 5 MG tablet TAKE 1 TABLET (5 MG TOTAL) BY MOUTH DAILY AT 10 PM. 90 tablet 1  ? anastrozole (ARIMIDEX) 1 MG tablet Take 1 tablet (1 mg total) by mouth daily. 90 tablet 3  ? atorvastatin (LIPITOR) 10 MG tablet Take 10 mg by mouth in the morning.    ? Calcium Carb-Cholecalciferol (CALCIUM 600 + D PO) Take 1 tablet by mouth 2 (two) times daily.    ? cetirizine (ZYRTEC) 10 MG tablet Take 10 mg by mouth daily.    ? cholecalciferol (VITAMIN D3) 25 MCG (1000 UT) tablet Take 1,000 Units by mouth in the morning.    ? ELIQUIS 5 MG TABS tablet TAKE 1 TABLET BY MOUTH TWICE A DAY 180 tablet 1  ? flecainide (TAMBOCOR) 100 MG tablet Take 1 tablet (100 mg total) by mouth 2 (two) times daily. 120 tablet 1  ? losartan (COZAAR) 50 MG tablet Take 1 tablet (50 mg total) by mouth daily at 10 pm. 90 tablet 0  ? methocarbamol (ROBAXIN) 500 MG tablet Take 250 mg by mouth at bedtime. Leg cramps    ?  metoprolol succinate (TOPROL-XL) 25 MG 24 hr tablet TAKE 1 TABLET BY MOUTH EVERY DAY (Patient taking differently: 25 mg daily with supper.) 90 tablet 1  ? nitroGLYCERIN (NITROSTAT) 0.4 MG SL tablet DISSOLVE 1 TABLET UNDER THE TONGUE EVERY 5 MINUTES AS NEEDED FOR CHEST PAIN. IF YOU REQUIRE MORE THAN TWO TABLETS FIVE MINUTES APART GO TO THE NEAREST ER VIA EMS. (Patient taking differently: Place 0.4 mg under the tongue every 5 (five) minutes x 3 doses as needed for chest pain.) 100 tablet 1  ? omeprazole (PRILOSEC) 20 MG capsule Take 20 mg by mouth daily.    ? spironolactone (ALDACTONE) 25 MG tablet Take 1 tablet  (25 mg total) by mouth every morning. 90 tablet 0  ? ?No current facility-administered medications for this visit.  ? ? ?PHYSICAL EXAMINATION: ?ECOG PERFORMANCE STATUS: 1 - Symptomatic but completely ambulatory ? ?Vitals:  ? 09/21/21 0956  ?BP: (!) 131/57  ?Pulse: (!) 57  ?Resp: 18  ?Temp: (!) 97.2 ?F (36.2 ?C)  ?SpO2: 100%  ? ?Filed Weights  ? 09/21/21 0956  ?Weight: 122 lb 12.8 oz (55.7 kg)  ? ? ?BREAST: No palpable masses or nodules in either right or left breasts. No palpable axillary supraclavicular or infraclavicular adenopathy no breast tenderness or nipple discharge. (exam performed in the presence of a chaperone) ? ?LABORATORY DATA:  ?I have reviewed the data as listed ?CMP Latest Ref Rng & Units 08/27/2021 08/17/2021 07/14/2021  ?Glucose 70 - 99 mg/dL 84 101(H) 102(H)  ?BUN 8 - 27 mg/dL _0 ?Creatinine 0.57 - 1.00 mg/dL 0.87 0.80 0.96  ?Sodium 134 - 144 mmol/L 135 131(L) 137  ?Potassium 3.5 - 5.2 mmol/L 4.6 4.0 4.0  ?Chloride 96 - 106 mmol/L 97 91(L) 98  ?CO2 20 - 29 mmol/L _1 ?Calcium 8.7 - 10.3 mg/dL 10.6(H) 10.2 9.8  ?Total Protein 6.0 - 8.5 g/dL - - -  ?Total Bilirubin 0.0 - 1.2 mg/dL - - -  ?Alkaline Phos 44 - 121 IU/L - - -  ?AST 0 - 40 IU/L - - -  ?ALT 0 - 32 IU/L - - -  ? ? ?Lab Results  ?Component Value Date  ? WBC 4.9 07/14/2021  ? HGB 14.4 07/14/2021  ? HCT 43.9 07/14/2021  ? MCV 88 07/14/2021  ? PLT 221 07/14/2021  ? ? ?ASSESSMENT & PLAN:  ?Carcinoma of upper-inner quadrant of right breast in female, estrogen receptor positive ([place]) ?07/17/2018:Screening detected right breast mass at 1 o'clock position 1.1 cm, no axillary lymph nodes, biopsy revealed IDC grade 1-2, ER 100%, PR 80%, Ki-67 5%, HER-2 negative, 2+ by IHC and ratio 1.22 by FISH, T1CN0 stage Ia ?  ?08/03/2018:Right lumpectomy: IDC 0.6 cm, grade 2, margins negative, 0/4 lymph nodes negative, ER 100%, PR 80%, HER-2 equivocal by IHC, FISH negative ratio 1.95, Ki-67 5%, T1BN0 stage Ia ?  ?Bone density 08/29/2018:T score -2:  Osteopenia ?I ordered a new bone density test to be done in the next 2 to 3 months. ?  ?Treatment plan: Adjuvant antiestrogen therapy with anastrozole 1 mg daily since March 2020. ?Anastrozole toxicities: none ?  ?Breast cancer surveillance: ?07/23/2021: Mammogram: Benign breast density category C ?09/21/2021: Physical examination of breasts: Benign ?  ?  ?Return to clinic annually for follow-ups. ? ? ? ?No orders of the defined types were placed in this encounter. ? ?The patient has a good understanding of the overall plan. she agrees with it. she will call with any problems that  may develop before the next visit here. ? ?Total time spent: 20 mins including face to face time and time spent for planning, charting and coordination of care ? ?Rulon Eisenmenger, MD, MPH ?09/21/2021 ? ?I, Thana Ates, am acting as scribe for Dr. Nicholas Blackwell. ? ?I have reviewed the above documentation for accuracy and completeness, and I agree with the above. ? ? ? ? ? ? ?

## 2021-09-20 NOTE — Assessment & Plan Note (Signed)
07/17/2018:Screening detected right breast mass at 1 o'clock position 1.1 cm, no axillary lymph nodes, biopsy revealed IDC grade 1-2, ER 100%, PR 80%, Ki-67 5%, HER-2 negative, 2+ by IHC and ratio 1.22 by FISH, T1CN0 stage Ia ?? ?08/03/2018:Right lumpectomy: IDC 0.6 cm, grade 2, margins negative, 0/4 lymph nodes negative, ER 100%, PR 80%, HER-2 equivocal by IHC, FISH negative ratio 1.95, Ki-67 5%, T1BN0 stage Ia ?? ?Bone density 08/29/2018:T score -2: Osteopenia ?I ordered a new bone density test to be done in the next 2 to 3 months. ?? ?Treatment plan: Adjuvant antiestrogen therapy with anastrozole 1 mg daily since March 2020. ?Anastrozole toxicities:?none ?? ?Due to her breast dimpling, mammogram and ultrasound obtained on 07/16/2020: Postsurgical changes and no suspicious findings, breast density category C ?? ?I reassured her that the changes in the breast are benign. ?Return to clinic annually for follow-ups. ?

## 2021-09-21 ENCOUNTER — Other Ambulatory Visit: Payer: Self-pay

## 2021-09-21 ENCOUNTER — Inpatient Hospital Stay: Payer: PPO | Attending: Hematology and Oncology | Admitting: Hematology and Oncology

## 2021-09-21 DIAGNOSIS — Z170 Estrogen receptor positive status [ER+]: Secondary | ICD-10-CM | POA: Insufficient documentation

## 2021-09-21 DIAGNOSIS — M8580 Other specified disorders of bone density and structure, unspecified site: Secondary | ICD-10-CM | POA: Insufficient documentation

## 2021-09-21 DIAGNOSIS — C50211 Malignant neoplasm of upper-inner quadrant of right female breast: Secondary | ICD-10-CM | POA: Insufficient documentation

## 2021-09-21 DIAGNOSIS — Z79811 Long term (current) use of aromatase inhibitors: Secondary | ICD-10-CM | POA: Insufficient documentation

## 2021-09-21 MED ORDER — ANASTROZOLE 1 MG PO TABS: 1.0000 mg | ORAL_TABLET | Freq: Every day | ORAL | 3 refills | Status: DC

## 2021-10-08 DIAGNOSIS — L570 Actinic keratosis: Secondary | ICD-10-CM | POA: Diagnosis not present

## 2021-10-08 DIAGNOSIS — Z85828 Personal history of other malignant neoplasm of skin: Secondary | ICD-10-CM | POA: Diagnosis not present

## 2021-10-08 DIAGNOSIS — D1801 Hemangioma of skin and subcutaneous tissue: Secondary | ICD-10-CM | POA: Diagnosis not present

## 2021-10-08 DIAGNOSIS — L814 Other melanin hyperpigmentation: Secondary | ICD-10-CM | POA: Diagnosis not present

## 2021-10-08 DIAGNOSIS — D225 Melanocytic nevi of trunk: Secondary | ICD-10-CM | POA: Diagnosis not present

## 2021-10-08 DIAGNOSIS — L821 Other seborrheic keratosis: Secondary | ICD-10-CM | POA: Diagnosis not present

## 2021-10-23 ENCOUNTER — Other Ambulatory Visit: Payer: Self-pay | Admitting: Cardiology

## 2021-10-26 DIAGNOSIS — G4733 Obstructive sleep apnea (adult) (pediatric): Secondary | ICD-10-CM | POA: Diagnosis not present

## 2021-10-26 NOTE — Telephone Encounter (Signed)
Refill request

## 2021-11-04 ENCOUNTER — Other Ambulatory Visit: Payer: Self-pay | Admitting: Cardiology

## 2021-11-04 DIAGNOSIS — I4891 Unspecified atrial fibrillation: Secondary | ICD-10-CM

## 2021-11-04 DIAGNOSIS — I10 Essential (primary) hypertension: Secondary | ICD-10-CM

## 2021-11-07 ENCOUNTER — Other Ambulatory Visit: Payer: Self-pay | Admitting: Cardiology

## 2021-11-07 DIAGNOSIS — I10 Essential (primary) hypertension: Secondary | ICD-10-CM

## 2021-11-09 ENCOUNTER — Ambulatory Visit: Payer: PPO | Admitting: Cardiology

## 2021-11-09 ENCOUNTER — Encounter: Payer: Self-pay | Admitting: Cardiology

## 2021-11-09 VITALS — BP 114/59 | HR 56 | Temp 97.7°F | Resp 16 | Ht 61.0 in | Wt 124.4 lb

## 2021-11-09 DIAGNOSIS — Z7901 Long term (current) use of anticoagulants: Secondary | ICD-10-CM

## 2021-11-09 DIAGNOSIS — I10 Essential (primary) hypertension: Secondary | ICD-10-CM | POA: Diagnosis not present

## 2021-11-09 DIAGNOSIS — Z79899 Other long term (current) drug therapy: Secondary | ICD-10-CM

## 2021-11-09 DIAGNOSIS — E782 Mixed hyperlipidemia: Secondary | ICD-10-CM | POA: Diagnosis not present

## 2021-11-09 DIAGNOSIS — Z9289 Personal history of other medical treatment: Secondary | ICD-10-CM

## 2021-11-09 DIAGNOSIS — Z9989 Dependence on other enabling machines and devices: Secondary | ICD-10-CM | POA: Diagnosis not present

## 2021-11-09 DIAGNOSIS — I480 Paroxysmal atrial fibrillation: Secondary | ICD-10-CM

## 2021-11-09 DIAGNOSIS — G4733 Obstructive sleep apnea (adult) (pediatric): Secondary | ICD-10-CM | POA: Diagnosis not present

## 2021-11-09 MED ORDER — FLECAINIDE ACETATE 100 MG PO TABS: 100.0000 mg | ORAL_TABLET | Freq: Two times a day (BID) | ORAL | 1 refills | Status: DC

## 2021-11-09 NOTE — Progress Notes (Signed)
? ?Kelly Blackwell ?Date of Birth: Oct 30, 1940 ?MRN: [id number] ?Cardiologist:  Rex Kras, DO, [facility] (established care 11/15/2019) ?Former Cardiology Providers: Dr. Adrian Prows ? ?Date: 11/09/21 ?Last Office Visit: 08/11/2021 ? ?Chief Complaint  ?Patient presents with  ? Atrial Fibrillation  ? Follow-up  ? ?HPI  ?Kelly Blackwell is a 81 y.o. female whose past medical history and cardiovascular risk factors are: Sleep Apnea, paroxysmal atrial fibrillation, hypertension, hyperlipidemia, history of breast cancer, postmenopausal female, advanced age.  ? ?During her one of her annual follow-ups in November 2021 patient was noted to be in atrial fibrillation.  Initially underwent rate control strategy and subsequently cardioverted to normal sinus rhythm in February 2022.  However, when she came back in for a 2-week follow-up visit she had reverted back to A-fib and wanted to manage it medically without up titration/addition of antiarrhythmic medications.  However, due to symptomatic atrial fibrillation she was later initiated on flecainide and subsequently underwent direct-current cardioversion again in January 2023.  Since then she remains in normal sinus rhythm. ? ?She now presents for 65-monthfollow-up and states that she is doing well from a cardiovascular standpoint.  In the interim she is also requested assistance with blood pressure management and her medications have been uptitrated.  Home blood pressure log reviewed.  She has been getting systolic blood pressures less than 110 mmHg often but asymptomatic. ? ?FUNCTIONAL STATUS: She walks 5 days a week for 30-45 minutes.   ? ?ALLERGIES: ?Allergies  ?Allergen Reactions  ? Sulfa Antibiotics Rash and Other (See Comments)  ?  Fever and rash   ? ? ?MEDICATION LIST PRIOR TO VISIT: ?Current Meds  ?Medication Sig  ? acetaminophen (TYLENOL) 500 MG tablet Take 500-1,000 mg by mouth every 6 (six) hours as needed (pain).  ? ALPRAZolam (XANAX) 0.25 MG tablet Take 0.25 mg by mouth  See admin instructions. Take 1 tablet (0.25 mg) by mouth scheduled at night for sleep; may take an additional dose during the day if needed for anxiety  ? anastrozole (ARIMIDEX) 1 MG tablet Take 1 tablet (1 mg total) by mouth daily.  ? atorvastatin (LIPITOR) 10 MG tablet Take 10 mg by mouth in the morning.  ? Calcium Carb-Cholecalciferol (CALCIUM 600 + D PO) Take 1 tablet by mouth 2 (two) times daily.  ? cetirizine (ZYRTEC) 10 MG tablet Take 10 mg by mouth daily.  ? cholecalciferol (VITAMIN D3) 25 MCG (1000 UT) tablet Take 1,000 Units by mouth in the morning.  ? ELIQUIS 5 MG TABS tablet TAKE 1 TABLET BY MOUTH TWICE A DAY  ? losartan (COZAAR) 50 MG tablet TAKE 1 TABLET (50 MG TOTAL) BY MOUTH DAILY AT 10 PM.  ? methocarbamol (ROBAXIN) 500 MG tablet Take 250 mg by mouth at bedtime. Leg cramps  ? metoprolol succinate (TOPROL-XL) 25 MG 24 hr tablet TAKE 1 TABLET BY MOUTH EVERY DAY (Patient taking differently: 25 mg daily with supper.)  ? nitroGLYCERIN (NITROSTAT) 0.4 MG SL tablet DISSOLVE 1 TABLET UNDER THE TONGUE EVERY 5 MINUTES AS NEEDED FOR CHEST PAIN. IF YOU REQUIRE MORE THAN TWO TABLETS FIVE MINUTES APART GO TO THE NEAREST ER VIA EMS. (Patient taking differently: Place 0.4 mg under the tongue every 5 (five) minutes x 3 doses as needed for chest pain.)  ? omeprazole (PRILOSEC) 20 MG capsule Take 20 mg by mouth daily.  ? spironolactone (ALDACTONE) 25 MG tablet Take 1 tablet (25 mg total) by mouth every morning.  ? [DISCONTINUED] amLODipine (NORVASC) 5 MG tablet TAKE 1  TABLET (5 MG TOTAL) BY MOUTH DAILY AT 10 PM.  ? [DISCONTINUED] flecainide (TAMBOCOR) 100 MG tablet TAKE 1 TABLET BY MOUTH TWICE A DAY  ?  ? ?PAST MEDICAL HISTORY: ?Past Medical History:  ?Diagnosis Date  ? Allergy   ? seasonal allergies.   ? Anxiety   ? Arthritis   ? Neck  ? Atrial fibrillation ([place])   ? Breast cancer ([place])   ? Hypercholesteremia   ? Hypertension   ? OSA on CPAP 12/2019  ? Osteopenia   ? Personal history of radiation therapy   ? ? ?PAST  SURGICAL HISTORY: ?Past Surgical History:  ?Procedure Laterality Date  ? APPENDECTOMY    ? age 19  ? BREAST LUMPECTOMY Right 08/03/2018  ? BREAST LUMPECTOMY WITH RADIOACTIVE SEED AND SENTINEL LYMPH NODE BIOPSY Right 08/03/2018  ? Procedure: RIGHT BREAST LUMPECTOMY WITH RADIOACTIVE SEED AND SENTINEL LYMPH NODE BIOPSY WITH BLUE DYE INJECTION;  Surgeon: Donnie Mesa, MD;  Location: [facility];  Service: General;  Laterality: Right;  ? bunionectmy    ? 1986  ? CARDIOVERSION N/A 09/03/2020  ? Procedure: CARDIOVERSION;  Surgeon: Rex Kras, DO;  Location: [facility];  Service: Cardiovascular;  Laterality: N/A;  ? CARDIOVERSION N/A 07/22/2021  ? Procedure: CARDIOVERSION;  Surgeon: Rex Kras, DO;  Location: [facility];  Service: Cardiovascular;  Laterality: N/A;  ? CHOLECYSTECTOMY    ? age 31  ? TONSILLECTOMY    ? ? ?FAMILY HISTORY: ?The patient family history includes AAA (abdominal aortic aneurysm) in her father; Atrial fibrillation in her mother; Colon cancer in her father; Coronary artery disease in her father; Heart failure in her mother; Hypertension in her mother; Osteoporosis in her mother. ? ?SOCIAL HISTORY:  ?The patient  reports that she has never smoked. She has never used smokeless tobacco. She reports that she does not currently use alcohol. She reports that she does not use drugs. ? ?REVIEW OF SYSTEMS: ?Review of Systems  ?Constitutional: Negative for chills, fever and malaise/fatigue.  ?HENT:  Negative for hoarse voice and nosebleeds.   ?Eyes:  Negative for discharge, double vision and pain.  ?Cardiovascular:  Negative for chest pain, claudication, dyspnea on exertion, leg swelling, near-syncope, orthopnea, palpitations, paroxysmal nocturnal dyspnea and syncope.  ?Respiratory:  Negative for hemoptysis and shortness of breath.   ?Musculoskeletal:  Negative for muscle cramps and myalgias.  ?Gastrointestinal:  Negative for abdominal pain, constipation, diarrhea, hematemesis, hematochezia, melena, nausea and  vomiting.  ?Neurological:  Negative for dizziness and light-headedness.  ? ?PHYSICAL EXAM: ? ?  11/09/2021  ? 11:12 AM 09/21/2021  ?  9:56 AM 08/11/2021  ?  3:19 PM  ?Vitals with BMI  ?Height '5\' 1"'$  5' 1.5" 5' 1.5"  ?Weight 124 lbs 6 oz 122 lbs 13 oz 125 lbs 3 oz  ?BMI 23.52 22.83 23.28  ?Systolic 250 539 767  ?Diastolic 59 57 58  ?Pulse 56 57 57  ? ?CONSTITUTIONAL: Well-developed and well-nourished. No acute distress.  ?SKIN: Skin is warm and dry. No rash noted. No cyanosis. No pallor. No jaundice ?HEAD: Normocephalic and atraumatic.  ?EYES: No scleral icterus ?MOUTH/THROAT: Moist oral membranes.  ?NECK: No JVD present. No thyromegaly noted. No carotid bruits  ?LYMPHATIC: No visible cervical adenopathy.  ?CHEST Normal respiratory effort. No intercostal retractions  ?LUNGS: Clear to auscultation bilaterally.  No stridor. No wheezes. No rales.  ?CARDIOVASCULAR: Bradycardic, S1-S2, no murmurs rubs or gallops appreciated ?ABDOMINAL: No apparent ascites.  ?EXTREMITIES: No peripheral edema, warm to touch.  ?HEMATOLOGIC: No significant bruising ?NEUROLOGIC: Oriented  to person, place, and time. Nonfocal. Normal muscle tone.  ?PSYCHIATRIC: Normal mood and affect. Normal behavior. Cooperative ?No change in physical examination since last office encounter. ? ?CARDIAC DATABASE: ?EKG: ?11/15/2019: Normal sinus rhythm, 79 bpm, normal axis, ST depressions in the inferior and lateral leads suggestive of possible ischemia.  Prior EKG dated 07/31/2018 noted normal sinus with PVCs and nonspecific ST abnormality. ? ?09/03/2020: Normal sinus rhythm, 67 bpm, normal axis, first-degree AV block. ? ?06/18/2021: Atrial fibrillation, 68 bpm, poor R wave progression, without underlying injury pattern.  ? ?November 09, 2021: Sinus bradycardia, 54 bpm, first-degree AV block, without underlying ischemia injury pattern.   ? ?Echocardiogram: ?11/21/2019: LVEF 60 to 65%, mild LVH, indeterminate diastolic filling pattern, elevated LAP, mild MR, mild TR. ? ?Stress  Testing: ?Lexiscan (Walking with mod Bruce)Tetrofosmin Stress Test  11/19/2019:  ?Myocardial perfusion is normal.  ?Overall LV systolic function is normal without regional wall motion abnormalities.  ?S

## 2021-11-12 DIAGNOSIS — H35033 Hypertensive retinopathy, bilateral: Secondary | ICD-10-CM | POA: Diagnosis not present

## 2021-11-12 DIAGNOSIS — Z961 Presence of intraocular lens: Secondary | ICD-10-CM | POA: Diagnosis not present

## 2021-11-12 DIAGNOSIS — I10 Essential (primary) hypertension: Secondary | ICD-10-CM | POA: Diagnosis not present

## 2021-11-12 DIAGNOSIS — H35371 Puckering of macula, right eye: Secondary | ICD-10-CM | POA: Diagnosis not present

## 2021-11-12 DIAGNOSIS — H04123 Dry eye syndrome of bilateral lacrimal glands: Secondary | ICD-10-CM | POA: Diagnosis not present

## 2021-11-14 ENCOUNTER — Other Ambulatory Visit: Payer: Self-pay | Admitting: Cardiology

## 2021-11-14 DIAGNOSIS — I10 Essential (primary) hypertension: Secondary | ICD-10-CM

## 2021-11-14 DIAGNOSIS — I4891 Unspecified atrial fibrillation: Secondary | ICD-10-CM

## 2021-12-08 DIAGNOSIS — E559 Vitamin D deficiency, unspecified: Secondary | ICD-10-CM | POA: Diagnosis not present

## 2021-12-08 DIAGNOSIS — I10 Essential (primary) hypertension: Secondary | ICD-10-CM | POA: Diagnosis not present

## 2021-12-08 DIAGNOSIS — E785 Hyperlipidemia, unspecified: Secondary | ICD-10-CM | POA: Diagnosis not present

## 2021-12-08 DIAGNOSIS — R7989 Other specified abnormal findings of blood chemistry: Secondary | ICD-10-CM | POA: Diagnosis not present

## 2021-12-15 DIAGNOSIS — E785 Hyperlipidemia, unspecified: Secondary | ICD-10-CM | POA: Diagnosis not present

## 2021-12-15 DIAGNOSIS — L989 Disorder of the skin and subcutaneous tissue, unspecified: Secondary | ICD-10-CM | POA: Diagnosis not present

## 2021-12-15 DIAGNOSIS — Z1331 Encounter for screening for depression: Secondary | ICD-10-CM | POA: Diagnosis not present

## 2021-12-15 DIAGNOSIS — Z1339 Encounter for screening examination for other mental health and behavioral disorders: Secondary | ICD-10-CM | POA: Diagnosis not present

## 2021-12-15 DIAGNOSIS — I4819 Other persistent atrial fibrillation: Secondary | ICD-10-CM | POA: Diagnosis not present

## 2021-12-15 DIAGNOSIS — J45909 Unspecified asthma, uncomplicated: Secondary | ICD-10-CM | POA: Diagnosis not present

## 2021-12-15 DIAGNOSIS — Z0000 Encounter for general adult medical examination without abnormal findings: Secondary | ICD-10-CM | POA: Diagnosis not present

## 2021-12-15 DIAGNOSIS — Z91018 Allergy to other foods: Secondary | ICD-10-CM | POA: Diagnosis not present

## 2021-12-15 DIAGNOSIS — G4733 Obstructive sleep apnea (adult) (pediatric): Secondary | ICD-10-CM | POA: Diagnosis not present

## 2021-12-15 DIAGNOSIS — R82998 Other abnormal findings in urine: Secondary | ICD-10-CM | POA: Diagnosis not present

## 2021-12-15 DIAGNOSIS — E559 Vitamin D deficiency, unspecified: Secondary | ICD-10-CM | POA: Diagnosis not present

## 2021-12-15 DIAGNOSIS — I10 Essential (primary) hypertension: Secondary | ICD-10-CM | POA: Diagnosis not present

## 2022-01-01 ENCOUNTER — Other Ambulatory Visit: Payer: Self-pay | Admitting: Cardiology

## 2022-01-01 DIAGNOSIS — Z6823 Body mass index (BMI) 23.0-23.9, adult: Secondary | ICD-10-CM | POA: Diagnosis not present

## 2022-01-01 DIAGNOSIS — I10 Essential (primary) hypertension: Secondary | ICD-10-CM

## 2022-01-01 DIAGNOSIS — Z01419 Encounter for gynecological examination (general) (routine) without abnormal findings: Secondary | ICD-10-CM | POA: Diagnosis not present

## 2022-01-01 DIAGNOSIS — Z0142 Encounter for cervical smear to confirm findings of recent normal smear following initial abnormal smear: Secondary | ICD-10-CM | POA: Diagnosis not present

## 2022-01-01 DIAGNOSIS — Z01411 Encounter for gynecological examination (general) (routine) with abnormal findings: Secondary | ICD-10-CM | POA: Diagnosis not present

## 2022-01-01 DIAGNOSIS — Z853 Personal history of malignant neoplasm of breast: Secondary | ICD-10-CM | POA: Diagnosis not present

## 2022-01-01 DIAGNOSIS — M8580 Other specified disorders of bone density and structure, unspecified site: Secondary | ICD-10-CM | POA: Diagnosis not present

## 2022-01-01 DIAGNOSIS — Z124 Encounter for screening for malignant neoplasm of cervix: Secondary | ICD-10-CM | POA: Diagnosis not present

## 2022-01-28 DIAGNOSIS — G4733 Obstructive sleep apnea (adult) (pediatric): Secondary | ICD-10-CM | POA: Diagnosis not present

## 2022-02-01 ENCOUNTER — Other Ambulatory Visit: Payer: Self-pay | Admitting: Cardiology

## 2022-02-01 DIAGNOSIS — I10 Essential (primary) hypertension: Secondary | ICD-10-CM

## 2022-03-08 DIAGNOSIS — C50211 Malignant neoplasm of upper-inner quadrant of right female breast: Secondary | ICD-10-CM | POA: Diagnosis not present

## 2022-03-08 DIAGNOSIS — Z170 Estrogen receptor positive status [ER+]: Secondary | ICD-10-CM | POA: Diagnosis not present

## 2022-03-30 ENCOUNTER — Ambulatory Visit: Payer: PPO | Admitting: Cardiology

## 2022-04-15 DIAGNOSIS — S025XXA Fracture of tooth (traumatic), initial encounter for closed fracture: Secondary | ICD-10-CM | POA: Diagnosis not present

## 2022-04-15 DIAGNOSIS — S299XXA Unspecified injury of thorax, initial encounter: Secondary | ICD-10-CM | POA: Diagnosis not present

## 2022-04-15 DIAGNOSIS — R220 Localized swelling, mass and lump, head: Secondary | ICD-10-CM | POA: Diagnosis not present

## 2022-04-15 DIAGNOSIS — R221 Localized swelling, mass and lump, neck: Secondary | ICD-10-CM | POA: Diagnosis not present

## 2022-04-15 DIAGNOSIS — Z043 Encounter for examination and observation following other accident: Secondary | ICD-10-CM | POA: Diagnosis not present

## 2022-04-15 DIAGNOSIS — S0993XA Unspecified injury of face, initial encounter: Secondary | ICD-10-CM | POA: Diagnosis not present

## 2022-04-28 ENCOUNTER — Other Ambulatory Visit: Payer: Self-pay | Admitting: Cardiology

## 2022-04-28 DIAGNOSIS — I10 Essential (primary) hypertension: Secondary | ICD-10-CM

## 2022-05-02 ENCOUNTER — Other Ambulatory Visit: Payer: Self-pay | Admitting: Cardiology

## 2022-05-02 DIAGNOSIS — I10 Essential (primary) hypertension: Secondary | ICD-10-CM

## 2022-05-06 ENCOUNTER — Telehealth: Payer: Self-pay | Admitting: Cardiology

## 2022-05-06 NOTE — Telephone Encounter (Signed)
[place]-AF trial medication added to medication list.  Last dose of Eliquis taken on [id number].

## 2022-05-14 ENCOUNTER — Ambulatory Visit: Payer: PPO | Admitting: Cardiology

## 2022-05-14 ENCOUNTER — Encounter: Payer: Self-pay | Admitting: Cardiology

## 2022-05-14 VITALS — BP 133/55 | HR 57 | Ht 61.0 in | Wt 126.0 lb

## 2022-05-14 DIAGNOSIS — G4733 Obstructive sleep apnea (adult) (pediatric): Secondary | ICD-10-CM

## 2022-05-14 DIAGNOSIS — Z79899 Other long term (current) drug therapy: Secondary | ICD-10-CM | POA: Diagnosis not present

## 2022-05-14 DIAGNOSIS — Z9289 Personal history of other medical treatment: Secondary | ICD-10-CM

## 2022-05-14 DIAGNOSIS — I10 Essential (primary) hypertension: Secondary | ICD-10-CM | POA: Diagnosis not present

## 2022-05-14 DIAGNOSIS — E782 Mixed hyperlipidemia: Secondary | ICD-10-CM | POA: Diagnosis not present

## 2022-05-14 DIAGNOSIS — Z7901 Long term (current) use of anticoagulants: Secondary | ICD-10-CM

## 2022-05-14 DIAGNOSIS — I4891 Unspecified atrial fibrillation: Secondary | ICD-10-CM

## 2022-05-14 DIAGNOSIS — I480 Paroxysmal atrial fibrillation: Secondary | ICD-10-CM

## 2022-05-14 MED ORDER — METOPROLOL SUCCINATE ER 25 MG PO TB24: 25.0000 mg | ORAL_TABLET | Freq: Every day | ORAL | 1 refills | Status: DC

## 2022-05-14 MED ORDER — FLECAINIDE ACETATE 100 MG PO TABS: 100.0000 mg | ORAL_TABLET | Freq: Two times a day (BID) | ORAL | 1 refills | Status: DC

## 2022-05-14 NOTE — Progress Notes (Unsigned)
Kelly Blackwell Date of Birth: [birth year] MRN: [id number] Cardiologist:  Rex Kras, DO, [facility] (established care 11/15/2019) Former Cardiology Providers: Dr. Adrian Prows  Date: 05/14/22 Last Office Visit: 11/09/2021  Chief Complaint  Patient presents with   Atrial Fibrillation   Follow-up   HPI  Kelly Blackwell is a 81 y.o. female whose past medical history and cardiovascular risk factors are: Sleep Apnea, paroxysmal atrial fibrillation, hypertension, hyperlipidemia, history of breast cancer, postmenopausal female, advanced age.   Patient is being followed by the practice for paroxysmal atrial fibrillation.  She initially underwent cardioversion in February 2022 and restored normal sinus rhythm.  However by the time she came in for a 2-week follow-up she was back in A-fib.  Since then her antiarrhythmic medications have been uptitrated but she did not convert.  Therefore she underwent direct-current cardioversion again in January 2023 which restored normal sinus rhythm and since then has been in rhythm.  She presents today for 49-monthfollow-up visit.  Denies chest pain or anginal discomfort.  But has been taking sublingual nitroglycerin tablets once a month for nonspecific precordial pain.  Home blood pressures are well controlled.  Patient states that approximately 2 or 3 weeks ago when she was on a vacation she had a mechanical fall which led to breaking 2 of her front teeth.  She was evaluated in the ED and did not have any intracranial bleeding.  No other falls prior to that.  Patient does not endorse any evidence of bleeding.  No recent labs available for review.  FUNCTIONAL STATUS: She walks 5 days a week for 30-45 minutes.    ALLERGIES: Allergies  Allergen Reactions   Sulfa Antibiotics Rash and Other (See Comments)    Fever and rash     MEDICATION LIST PRIOR TO VISIT: Current Meds  Medication Sig   acetaminophen (TYLENOL) 500 MG tablet Take 500-1,000 mg by mouth every 6  (six) hours as needed (pain).   ALPRAZolam (XANAX) 0.25 MG tablet Take 0.25 mg by mouth See admin instructions. Take 1 tablet (0.25 mg) by mouth scheduled at night for sleep; may take an additional dose during the day if needed for anxiety   anastrozole (ARIMIDEX) 1 MG tablet Take 1 tablet (1 mg total) by mouth daily.   atorvastatin (LIPITOR) 10 MG tablet Take 10 mg by mouth in the morning.   Calcium Carb-Cholecalciferol (CALCIUM 600 + D PO) Take 1 tablet by mouth 2 (two) times daily.   cetirizine (ZYRTEC) 10 MG tablet Take 10 mg by mouth daily.   cholecalciferol (VITAMIN D3) 25 MCG (1000 UT) tablet Take 1,000 Units by mouth in the morning.   ELIQUIS 5 MG TABS tablet TAKE 1 TABLET BY MOUTH TWICE A DAY   Investigational - Study Medication Take 1-2 tablets by mouth in the morning and at bedtime. Study name: OCEANIC-AF (Asundexian - factor XIa inhibitor PO QD vs Apixaban PO BID in patients with A. Fib for stroke prevention). Additional study details: Treat study drug like Eliquis.   losartan (COZAAR) 50 MG tablet TAKE 1 TABLET (50 MG TOTAL) BY MOUTH DAILY AT 10 PM.   methocarbamol (ROBAXIN) 500 MG tablet Take 250 mg by mouth at bedtime. Leg cramps   nitroGLYCERIN (NITROSTAT) 0.4 MG SL tablet DISSOLVE 1 TABLET UNDER THE TONGUE EVERY 5 MINUTES AS NEEDED FOR CHEST PAIN. IF YOU REQUIRE MORE THAN TWO TABLETS FIVE MINUTES APART GO TO THE NEAREST ER VIA EMS. (Patient taking differently: Place 0.4 mg under the tongue every 5 (  five) minutes x 3 doses as needed for chest pain.)   omeprazole (PRILOSEC) 20 MG capsule Take 20 mg by mouth daily.   spironolactone (ALDACTONE) 25 MG tablet TAKE 1 TABLET BY MOUTH EVERY DAY IN THE MORNING   [DISCONTINUED] metoprolol succinate (TOPROL-XL) 25 MG 24 hr tablet TAKE 1 TABLET BY MOUTH EVERY DAY     PAST MEDICAL HISTORY: Past Medical History:  Diagnosis Date   Allergy    seasonal allergies.    Anxiety    Arthritis    Neck   Atrial fibrillation (HCC)    Breast cancer  (HCC)    Hypercholesteremia    Hypertension    OSA on CPAP 12/2019   Osteopenia    Personal history of radiation therapy     PAST SURGICAL HISTORY: Past Surgical History:  Procedure Laterality Date   APPENDECTOMY     age 18   BREAST LUMPECTOMY Right 08/03/2018   BREAST LUMPECTOMY WITH RADIOACTIVE SEED AND SENTINEL LYMPH NODE BIOPSY Right 08/03/2018   Procedure: RIGHT BREAST LUMPECTOMY WITH RADIOACTIVE SEED AND SENTINEL LYMPH NODE BIOPSY WITH BLUE DYE INJECTION;  Surgeon: Donnie Mesa, MD;  Location: [facility];  Service: General;  Laterality: Right;   bunionectmy     1986   CARDIOVERSION N/A 09/03/2020   Procedure: CARDIOVERSION;  Surgeon: Rex Kras, DO;  Location: [facility];  Service: Cardiovascular;  Laterality: N/A;   CARDIOVERSION N/A 07/22/2021   Procedure: CARDIOVERSION;  Surgeon: Rex Kras, DO;  Location: [facility];  Service: Cardiovascular;  Laterality: N/A;   CHOLECYSTECTOMY     age 48   TONSILLECTOMY      FAMILY HISTORY: The patient family history includes AAA (abdominal aortic aneurysm) in her father; Atrial fibrillation in her mother; Colon cancer in her father; Coronary artery disease in her father; Heart failure in her mother; Hypertension in her mother; Osteoporosis in her mother.  SOCIAL HISTORY:  The patient  reports that she has never smoked. She has never used smokeless tobacco. She reports that she does not currently use alcohol. She reports that she does not use drugs.  REVIEW OF SYSTEMS: Review of Systems  Cardiovascular:  Negative for chest pain, claudication, dyspnea on exertion, irregular heartbeat, leg swelling, near-syncope, orthopnea, palpitations, paroxysmal nocturnal dyspnea and syncope.  Respiratory:  Negative for shortness of breath.   Hematologic/Lymphatic: Negative for bleeding problem.  Musculoskeletal:  Negative for muscle cramps and myalgias.  Neurological:  Negative for dizziness and light-headedness.    PHYSICAL EXAM:     05/14/2022    1:06 PM 11/09/2021   11:12 AM 09/21/2021    9:56 AM  Vitals with BMI  Height _0  _1  5' 1.5"  Weight 126 lbs 124 lbs 6 oz 122 lbs 13 oz  BMI 23.82 60.73 71.06  Systolic 269 485 462  Diastolic 55 59 57  Pulse 57 56 57   Physical Exam  Constitutional: No distress.  Age appropriate, hemodynamically stable.   Neck: No JVD present.  Cardiovascular: Regular rhythm, S1 normal, S2 normal, intact distal pulses and normal pulses. Bradycardia present. Exam reveals no gallop, no S3 and no S4.  No murmur heard. Pulmonary/Chest: Effort normal and breath sounds normal. No stridor. She has no wheezes. She has no rales.  Abdominal: Soft. Bowel sounds are normal. She exhibits no distension. There is no abdominal tenderness.  Musculoskeletal:        General: No edema.     Cervical back: Neck supple.  Neurological: She is alert and oriented to person, place,  and time. She has intact cranial nerves (2-12).  Skin: Skin is warm and moist.   CARDIAC DATABASE: EKG: 11/15/2019: Normal sinus rhythm, 79 bpm, normal axis, ST depressions in the inferior and lateral leads suggestive of possible ischemia.  Prior EKG dated 07/31/2018 noted normal sinus with PVCs and nonspecific ST abnormality.  09/03/2020: Normal sinus rhythm, 67 bpm, normal axis, first-degree AV block.  06/18/2021: Atrial fibrillation, 68 bpm, poor R wave progression, without underlying injury pattern.   November 09, 2021: Sinus bradycardia, 54 bpm, first-degree AV block, without underlying ischemia injury pattern.    05/14/2022: Sinus bradycardia, 57 bpm, first-degree AV block, low voltage in the limb leads, without underlying ischemia injury pattern.  Echocardiogram: 11/21/2019: LVEF 60 to 65%, mild LVH, indeterminate diastolic filling pattern, elevated LAP, mild MR, mild TR.  Stress Testing: Lexiscan (Walking with mod Bruce)Tetrofosmin Stress Test  11/19/2019:  Myocardial perfusion is normal.  Overall LV systolic function is  normal without regional wall motion abnormalities.  Stress LV EF: 61%.  No previous exam available for comparison. Low risk study.   Heart Catheterization: None  Procedures: 09/03/2020: Direct-current cardioversion, 200 J x 1, atrial fibrillation converted to normal sinus rhythm.  July 22, 2021: Direct-current cardioversion, 200 J x 1 converted to NSR.  LABORATORY DATA:    Latest Ref Rng & Units 07/14/2021    9:08 AM 05/21/2021    3:53 PM 03/31/2021   11:12 AM  CBC  WBC 3.4 - 10.8 x10E3/uL 4.9     Hemoglobin 11.1 - 15.9 g/dL 14.4  14.8  14.2   Hematocrit 34.0 - 46.6 % 43.9  43.1  41.8   Platelets 150 - 450 x10E3/uL 221          Latest Ref Rng & Units 08/27/2021    8:57 AM 08/17/2021   11:04 AM 07/14/2021    9:08 AM  CMP  Glucose 70 - 99 mg/dL 84  101  102   BUN 8 - 27 mg/dL _0 Creatinine 0.57 - 1.00 mg/dL 0.87  0.80  0.96   Sodium 134 - 144 mmol/L 135  131  137   Potassium 3.5 - 5.2 mmol/L 4.6  4.0  4.0   Chloride 96 - 106 mmol/L 97  91  98   CO2 20 - 29 mmol/L _1 Calcium 8.7 - 10.3 mg/dL 10.6  10.2  9.8    External Labs: Collected: 10/11/2019 Creatinine 0.8 mg/dL. eGFR: 69 mL/min per 1.73 m Lipid profile: Total cholesterol 169, triglycerides 54, HDL 79, LDL 79, non-HDL 90 Hemoglobin A1c: None  IMPRESSION:    ICD-10-CM   1. Paroxysmal atrial fibrillation (HCC)  I48.0 EKG 81-WEXH    Basic metabolic panel    Hemoglobin and hematocrit, blood    2. Long term (current) use of anticoagulants  Z79.01     3. Long term current use of antiarrhythmic drug  Z79.899 flecainide (TAMBOCOR) 100 MG tablet    4. History of cardioversion  Z92.89     5. Benign hypertension  I10     6. Mixed hyperlipidemia  E78.2     7. OSA on CPAP  G47.33     8. Atrial fibrillation (HCC)  I48.91 metoprolol succinate (TOPROL-XL) 25 MG 24 hr tablet       RECOMMENDATIONS: AUBREIGH FUERTE is a 81 y.o. female whose past medical history and cardiac risk factors  include:Sleep Apnea, atrial fibrillation, hypertension, hyperlipidemia, history of breast cancer, postmenopausal female,  advanced age.   Paroxysmal atrial fibrillation (HCC)/direct-current cardioversion x2 Rate control: Toprol-XL. Rhythm control: Flecainide. Thromboembolic prophylaxis Eliquis. CHA2DS2-VASc SCORE is 19 (age >16, HTN, female gender) which correlates to 4.0 % risk of stroke per year.  After the initial cardioversion in February 2022 she had ERAF and continued medical management until she became profoundly symptomatic.  Later started on flecainide and underwent repeat direct-current cardioversion in January 2023 and remains in normal sinus rhythm.   Continue current medical therapy. Patient is instructed in enrolling into [place] AF Trial.   Long term (current) use of anticoagulants Indication: Paroxysmal atrial fibrillation. Does not endorse any evidence of bleeding. Most recent hemoglobin 14.4 g/dL 07/14/2021 We will repeat hemoglobin hematocrit prior to his next office visit.  Long term current use of antiarrhythmic drug Indication: Atrial fibrillation. Tolerating flecainide without any side effects or intolerances. Most recent renal function stable serum creatinine 0.96 mg/dL. We will refill flecainide for 6 months. Patient will have labs with her PCP as part of her annual well visit in May 2023 we will repeat BMP prior to next visit.   Benign hypertension Office blood pressures are very well controlled. Home blood pressures are also well controlled -at times overcorrected. Given her age recommend holding amlodipine 5 mg p.o. daily for now and reevaluate. Patient is asked to keep a log of her blood pressures to see if her blood pressure trends are well controlled. We emphasized the importance of a low-salt diet.  Mixed hyperlipidemia Continue statin therapy, most recent lipid profile reviewed.  Currently managed by primary care provider.  OSA on CPAP Reinforced the  importance of CPAP compliance  I will see her back in 6 months or sooner if change in clinical status for management and long-term monitoring of paroxysmal A-fib.  FINAL MEDICATION LIST END OF ENCOUNTER: Meds ordered this encounter  Medications   metoprolol succinate (TOPROL-XL) 25 MG 24 hr tablet    Sig: Take 1 tablet (25 mg total) by mouth daily.    Dispense:  90 tablet    Refill:  1   flecainide (TAMBOCOR) 100 MG tablet    Sig: Take 1 tablet (100 mg total) by mouth 2 (two) times daily.    Dispense:  180 tablet    Refill:  1      Medications Discontinued During This Encounter  Medication Reason   flecainide (TAMBOCOR) 100 MG tablet Reorder   metoprolol succinate (TOPROL-XL) 25 MG 24 hr tablet Reorder      Current Outpatient Medications:    acetaminophen (TYLENOL) 500 MG tablet, Take 500-1,000 mg by mouth every 6 (six) hours as needed (pain)., Disp: , Rfl:    ALPRAZolam (XANAX) 0.25 MG tablet, Take 0.25 mg by mouth See admin instructions. Take 1 tablet (0.25 mg) by mouth scheduled at night for sleep; may take an additional dose during the day if needed for anxiety, Disp: , Rfl:    anastrozole (ARIMIDEX) 1 MG tablet, Take 1 tablet (1 mg total) by mouth daily., Disp: 90 tablet, Rfl: 3   atorvastatin (LIPITOR) 10 MG tablet, Take 10 mg by mouth in the morning., Disp: , Rfl:    Calcium Carb-Cholecalciferol (CALCIUM 600 + D PO), Take 1 tablet by mouth 2 (two) times daily., Disp: , Rfl:    cetirizine (ZYRTEC) 10 MG tablet, Take 10 mg by mouth daily., Disp: , Rfl:    cholecalciferol (VITAMIN D3) 25 MCG (1000 UT) tablet, Take 1,000 Units by mouth in the morning., Disp: , Rfl:  ELIQUIS 5 MG TABS tablet, TAKE 1 TABLET BY MOUTH TWICE A DAY, Disp: 60 tablet, Rfl: 5   Investigational - Study Medication, Take 1-2 tablets by mouth in the morning and at bedtime. Study name: OCEANIC-AF (Asundexian - factor XIa inhibitor PO QD vs Apixaban PO BID in patients with A. Fib for stroke prevention).  Additional study details: Treat study drug like Eliquis., Disp: , Rfl:    losartan (COZAAR) 50 MG tablet, TAKE 1 TABLET (50 MG TOTAL) BY MOUTH DAILY AT 10 PM., Disp: 90 tablet, Rfl: 0   methocarbamol (ROBAXIN) 500 MG tablet, Take 250 mg by mouth at bedtime. Leg cramps, Disp: , Rfl:    nitroGLYCERIN (NITROSTAT) 0.4 MG SL tablet, DISSOLVE 1 TABLET UNDER THE TONGUE EVERY 5 MINUTES AS NEEDED FOR CHEST PAIN. IF YOU REQUIRE MORE THAN TWO TABLETS FIVE MINUTES APART GO TO THE NEAREST ER VIA EMS. (Patient taking differently: Place 0.4 mg under the tongue every 5 (five) minutes x 3 doses as needed for chest pain.), Disp: 100 tablet, Rfl: 1   omeprazole (PRILOSEC) 20 MG capsule, Take 20 mg by mouth daily., Disp: , Rfl:    spironolactone (ALDACTONE) 25 MG tablet, TAKE 1 TABLET BY MOUTH EVERY DAY IN THE MORNING, Disp: 90 tablet, Rfl: 0   flecainide (TAMBOCOR) 100 MG tablet, Take 1 tablet (100 mg total) by mouth 2 (two) times daily., Disp: 180 tablet, Rfl: 1   metoprolol succinate (TOPROL-XL) 25 MG 24 hr tablet, Take 1 tablet (25 mg total) by mouth daily., Disp: 90 tablet, Rfl: 1  Orders Placed This Encounter  Procedures   Basic metabolic panel   Hemoglobin and hematocrit, blood   EKG 12-Lead   --Continue cardiac medications as reconciled in final medication list. --Return in about 6 months (around 11/13/2022) for Follow up, A. fib. Or sooner if needed. --Continue follow-up with your primary care physician regarding the management of your other chronic comorbid conditions.  Patient's questions and concerns were addressed to her satisfaction. She voices understanding of the instructions provided during this encounter.   This note was created using a voice recognition software as a result there may be grammatical errors inadvertently enclosed that do not reflect the nature of this encounter. Every attempt is made to correct such errors.  Rex Kras, [place], [facility]  Pager: [phone] Office: [phone]

## 2022-05-15 LAB — HEMOGLOBIN AND HEMATOCRIT, BLOOD
Hematocrit: 38.7 % (ref 34.0–46.6)
Hemoglobin: 13.1 g/dL (ref 11.1–15.9)

## 2022-05-15 LAB — BASIC METABOLIC PANEL
BUN/Creatinine Ratio: 28 (ref 12–28)
BUN: 24 mg/dL (ref 8–27)
CO2: 24 mmol/L (ref 20–29)
Calcium: 9.7 mg/dL (ref 8.7–10.3)
Chloride: 96 mmol/L (ref 96–106)
Creatinine, Ser: 0.87 mg/dL (ref 0.57–1.00)
Glucose: 92 mg/dL (ref 70–99)
Potassium: 4.5 mmol/L (ref 3.5–5.2)
Sodium: 133 mmol/L — ABNORMAL LOW (ref 134–144)
eGFR: 67 mL/min/{1.73_m2} (ref >59)

## 2022-05-16 DIAGNOSIS — I480 Paroxysmal atrial fibrillation: Secondary | ICD-10-CM | POA: Insufficient documentation

## 2022-05-16 DIAGNOSIS — Z79899 Other long term (current) drug therapy: Secondary | ICD-10-CM | POA: Insufficient documentation

## 2022-05-16 DIAGNOSIS — E782 Mixed hyperlipidemia: Secondary | ICD-10-CM | POA: Insufficient documentation

## 2022-05-16 DIAGNOSIS — Z7901 Long term (current) use of anticoagulants: Secondary | ICD-10-CM | POA: Insufficient documentation

## 2022-05-16 DIAGNOSIS — Z9289 Personal history of other medical treatment: Secondary | ICD-10-CM | POA: Insufficient documentation

## 2022-05-16 DIAGNOSIS — I10 Essential (primary) hypertension: Secondary | ICD-10-CM | POA: Insufficient documentation

## 2022-05-19 NOTE — Progress Notes (Signed)
Called and spoke to patient she voiced understanding

## 2022-05-26 DIAGNOSIS — G4733 Obstructive sleep apnea (adult) (pediatric): Secondary | ICD-10-CM | POA: Diagnosis not present

## 2022-06-01 ENCOUNTER — Encounter: Payer: Self-pay | Admitting: Adult Health

## 2022-06-01 ENCOUNTER — Ambulatory Visit: Payer: PPO | Admitting: Adult Health

## 2022-06-01 VITALS — BP 128/68 | HR 53 | Ht 61.0 in | Wt 122.4 lb

## 2022-06-01 DIAGNOSIS — G4733 Obstructive sleep apnea (adult) (pediatric): Secondary | ICD-10-CM

## 2022-06-01 NOTE — Progress Notes (Signed)
PATIENT: Kelly Blackwell DOB: [birth year]  REASON FOR VISIT: follow up HISTORY FROM: patient  Chief Complaint  Patient presents with   Follow-up    Pt in 19 Pt here CPAP f/u Pt states she is concerned about her AHI scores       HISTORY OF PRESENT ILLNESS: Today 06/01/22:  Ms. Kelly Blackwell is an 81 year old female with a history of obstructive sleep apnea on CPAP.  She returns today for follow-up.  Her download indicates that she used her machine 73 out of the last 90 days for compliance of 81%.  On average she uses her machine 7 hours and 3 minutes.  She used her machine greater than 4 hours for compliance of 78.9%.  Her residual AHI is 4.8 on 4 to 8 cm of water with EPR of 2.  Last visit she was sent for mask refitting and reports that her new mask is working much better for her.   05/25/21: Ms Kelly Blackwell is a 81 y.o. female with a history of OSA on CPAP. She returns today for a follow up. Patient had a sleep study completed recently which showed that she would be better treated on a pressure setting of 4 -8 cmH2O. Her download for the last 30 days indicates that she uses her machine for 21 of the nights for a compliance of 70%. She use her machine greater than 4 hours each night. On average she uses her machine 6 hours and 42 minutes.  Her residual AHI is 6.7 on 4 to 8 cm of water. She expresses concern for having a higher AHI. Patient should be wearing a full face mask but she endorses discomfort and has only found the nasal pillows to be comfortable enough for her to wear. We will order a refitting for the full face dreamwear mask since it may feel less claustrophobic for her.            REVIEW OF SYSTEMS: Out of a complete 14 system review of symptoms, the patient complains only of the following symptoms, and all other reviewed systems are negative.  ESS 7  ALLERGIES: Allergies  Allergen Reactions   Sulfa Antibiotics Rash and Other (See Comments)    Fever and rash     HOME  MEDICATIONS: Outpatient Medications Prior to Visit  Medication Sig Dispense Refill   acetaminophen (TYLENOL) 500 MG tablet Take 500-1,000 mg by mouth every 6 (six) hours as needed (pain).     ALPRAZolam (XANAX) 0.25 MG tablet Take 0.25 mg by mouth See admin instructions. Take 1 tablet (0.25 mg) by mouth scheduled at night for sleep; may take an additional dose during the day if needed for anxiety     anastrozole (ARIMIDEX) 1 MG tablet Take 1 tablet (1 mg total) by mouth daily. 90 tablet 3   atorvastatin (LIPITOR) 10 MG tablet Take 10 mg by mouth in the morning.     Calcium Carb-Cholecalciferol (CALCIUM 600 + D PO) Take 1 tablet by mouth 2 (two) times daily.     cetirizine (ZYRTEC) 10 MG tablet Take 10 mg by mouth daily.     cholecalciferol (VITAMIN D3) 25 MCG (1000 UT) tablet Take 1,000 Units by mouth in the morning.     ELIQUIS 5 MG TABS tablet TAKE 1 TABLET BY MOUTH TWICE A DAY 60 tablet 5   flecainide (TAMBOCOR) 100 MG tablet Take 1 tablet (100 mg total) by mouth 2 (two) times daily. 180 tablet 1   Investigational - Study Medication Take  1-2 tablets by mouth in the morning and at bedtime. Study name: OCEANIC-AF (Asundexian - factor XIa inhibitor PO QD vs Apixaban PO BID in patients with A. Fib for stroke prevention). Additional study details: Treat study drug like Eliquis.     losartan (COZAAR) 50 MG tablet TAKE 1 TABLET (50 MG TOTAL) BY MOUTH DAILY AT 10 PM. 90 tablet 0   methocarbamol (ROBAXIN) 500 MG tablet Take 250 mg by mouth at bedtime. Leg cramps     metoprolol succinate (TOPROL-XL) 25 MG 24 hr tablet Take 1 tablet (25 mg total) by mouth daily. 90 tablet 1   nitroGLYCERIN (NITROSTAT) 0.4 MG SL tablet DISSOLVE 1 TABLET UNDER THE TONGUE EVERY 5 MINUTES AS NEEDED FOR CHEST PAIN. IF YOU REQUIRE MORE THAN TWO TABLETS FIVE MINUTES APART GO TO THE NEAREST ER VIA EMS. (Patient taking differently: Place 0.4 mg under the tongue every 5 (five) minutes x 3 doses as needed for chest pain.) 100 tablet 1    omeprazole (PRILOSEC) 20 MG capsule Take 20 mg by mouth daily.     spironolactone (ALDACTONE) 25 MG tablet TAKE 1 TABLET BY MOUTH EVERY DAY IN THE MORNING 90 tablet 0   No facility-administered medications prior to visit.    PAST MEDICAL HISTORY: Past Medical History:  Diagnosis Date   Allergy    seasonal allergies.    Anxiety    Arthritis    Neck   Atrial fibrillation (HCC)    Breast cancer (HCC)    Hypercholesteremia    Hypertension    OSA on CPAP 12/2019   Osteopenia    Personal history of radiation therapy     PAST SURGICAL HISTORY: Past Surgical History:  Procedure Laterality Date   APPENDECTOMY     age 5   BREAST LUMPECTOMY Right 08/03/2018   BREAST LUMPECTOMY WITH RADIOACTIVE SEED AND SENTINEL LYMPH NODE BIOPSY Right 08/03/2018   Procedure: RIGHT BREAST LUMPECTOMY WITH RADIOACTIVE SEED AND SENTINEL LYMPH NODE BIOPSY WITH BLUE DYE INJECTION;  Surgeon: Donnie Mesa, MD;  Location: [facility];  Service: General;  Laterality: Right;   bunionectmy     1986   CARDIOVERSION N/A 09/03/2020   Procedure: CARDIOVERSION;  Surgeon: Rex Kras, DO;  Location: [facility];  Service: Cardiovascular;  Laterality: N/A;   CARDIOVERSION N/A 07/22/2021   Procedure: CARDIOVERSION;  Surgeon: Rex Kras, DO;  Location: [facility];  Service: Cardiovascular;  Laterality: N/A;   CHOLECYSTECTOMY     age 87   TONSILLECTOMY      FAMILY HISTORY: Family History  Problem Relation Age of Onset   Hypertension Mother    Osteoporosis Mother    Atrial fibrillation Mother    Heart failure Mother    Colon cancer Father    Coronary artery disease Father    AAA (abdominal aortic aneurysm) Father    Sleep apnea Neg Hx     SOCIAL HISTORY: Social History   Socioeconomic History   Marital status: Married    Spouse name: Not on file   Number of children: 3   Years of education: Not on file   Highest education level: Not on file  Occupational History   Not on file  Tobacco Use   Smoking  status: Never   Smokeless tobacco: Never  Vaping Use   Vaping Use: Never used  Substance and Sexual Activity   Alcohol use: Not Currently    Comment: occasionally, a couple of times a month.    Drug use: Never   Sexual activity: Not on  file  Other Topics Concern   Not on file  Social History Narrative   6 grandchildren   Social Determinants of Health   Financial Resource Strain: Not on file  Food Insecurity: Not on file  Transportation Needs: No Transportation Needs (07/31/2018)   PRAPARE - Hydrologist (Medical): No    Lack of Transportation (Non-Medical): No  Physical Activity: Not on file  Stress: Not on file  Social Connections: Not on file  Intimate Partner Violence: Not At Risk (07/31/2018)   Humiliation, Afraid, Rape, and Kick questionnaire    Fear of Current or Ex-Partner: No    Emotionally Abused: No    Physically Abused: No    Sexually Abused: No      PHYSICAL EXAM  Vitals:   06/01/22 1017  BP: 128/68  Pulse: (!) 53  Weight: 122 lb 6.4 oz (55.5 kg)  Height: '5\' 1"'$  (1.549 m)    Body mass index is 23.13 kg/m.  Generalized: Well developed, in no acute distress  Chest: Lungs clear to auscultation bilaterally  Neurological examination  Mentation: Alert oriented to time, place, history taking. Follows all commands speech and language fluent Cranial nerve II-XII: Extraocular movements were full, visual field were full on confrontational test Head turning and shoulder shrug  were normal and symmetric.  Gait and station: Gait is normal.    DIAGNOSTIC DATA (LABS, IMAGING, TESTING) - I reviewed patient records, labs, notes, testing and imaging myself where available.  Lab Results  Component Value Date   WBC 4.9 07/14/2021   HGB 13.1 05/14/2022   HCT 38.7 05/14/2022   MCV 88 07/14/2021   PLT 221 07/14/2021      Component Value Date/Time   NA 133 (L) 05/14/2022 1414   K 4.5 05/14/2022 1414   CL 96 05/14/2022 1414   CO2 24  05/14/2022 1414   GLUCOSE 92 05/14/2022 1414   GLUCOSE 98 07/31/2018 1138   BUN 24 05/14/2022 1414   CREATININE 0.87 05/14/2022 1414   CALCIUM 9.7 05/14/2022 1414   PROT 7.1 05/21/2021 1553   ALBUMIN 5.0 (H) 05/21/2021 1553   AST 29 05/21/2021 1553   ALT 26 05/21/2021 1553   ALKPHOS 72 05/21/2021 1553   BILITOT 0.5 05/21/2021 1553   GFRNONAA 64 08/28/2020 1200   GFRAA 73 08/28/2020 1200      ASSESSMENT AND PLAN 81 y.o. year old female  has a past medical history of Allergy, Anxiety, Arthritis, Atrial fibrillation ([place]), Breast cancer ([place]), Hypercholesteremia, Hypertension, OSA on CPAP (12/2019), Osteopenia, and Personal history of radiation therapy. here with:  OSA on CPAP  - CPAP compliance good - Good treatment of AHI  - Encourage patient to use CPAP nightly and > 4 hours each night - F/U in 1 year or sooner if needed    [name], MSN, NP-C 06/01/2022, 10:08 AM [facility] [address] [place], [place] [address] [phone]

## 2022-06-01 NOTE — Patient Instructions (Signed)
Continue using CPAP nightly and greater than 4 hours each night °If your symptoms worsen or you develop new symptoms please let us know.  ° °

## 2022-06-09 ENCOUNTER — Telehealth: Payer: Self-pay

## 2022-06-09 NOTE — Telephone Encounter (Signed)
Patient called and stated that her research provider Dr. Mateo Flow was concerned about her recent sodium results. Patient would like to know if she should be concerned about them. Please advise.

## 2022-06-10 ENCOUNTER — Encounter: Payer: Self-pay | Admitting: Cardiology

## 2022-06-14 NOTE — Telephone Encounter (Signed)
From patient.

## 2022-06-15 ENCOUNTER — Other Ambulatory Visit: Payer: Self-pay | Admitting: Hematology and Oncology

## 2022-06-15 DIAGNOSIS — Z1231 Encounter for screening mammogram for malignant neoplasm of breast: Secondary | ICD-10-CM

## 2022-07-01 ENCOUNTER — Telehealth: Payer: Self-pay | Admitting: Pharmacist

## 2022-07-01 ENCOUNTER — Telehealth: Payer: Self-pay

## 2022-07-01 DIAGNOSIS — I480 Paroxysmal atrial fibrillation: Secondary | ICD-10-CM

## 2022-07-01 NOTE — Telephone Encounter (Signed)
Chief Complaint  Patient presents with   Medication Management    Stop study medication.     ICD-10-CM   1. Paroxysmal atrial fibrillation (HCC)  I48.0 apixaban (ELIQUIS) 2.5 MG TABS tablet     Medications Discontinued During This Encounter  Medication Reason   Investigational - Study Medication Ineffective   APIXABAN PO Dose change   ELIQUIS 5 MG TABS tablet Reorder     Patient was started on Eliquis 2.5 mg p.o. twice daily.  Dose decreased from 5 mg due to recommended dosing parameters of age, kidney disease and weight.

## 2022-07-01 NOTE — Telephone Encounter (Signed)
Patient came on 08Dec2023 for Common End of Treat visit for [facility] AF study.  Study medicine was stopped on 07Dec2023 and collected by site staff.  Patient started on Eliquis 2.'5mg'$  BID (dose for study due to age and body weight) on 08Dec2023.  Patient had own supply of Eliquis at home and advised to follow up with your office.

## 2022-07-01 NOTE — Telephone Encounter (Signed)
Patient calling for an Rx for Eliquis. She was in the research study that was cancelled. She was told she needs an Rx for 2.5 mg instead of 5 mg like she was previously taking. She also has some concerns about her sodium levels. I see Dr. Einar Gip told her there was no concerns while you were gone, but she is still concerned.   Please advise and I will call her back. Call back number: [phone]

## 2022-07-02 ENCOUNTER — Other Ambulatory Visit: Payer: Self-pay | Admitting: Cardiology

## 2022-07-02 DIAGNOSIS — Z7901 Long term (current) use of anticoagulants: Secondary | ICD-10-CM | POA: Diagnosis not present

## 2022-07-02 DIAGNOSIS — Z79899 Other long term (current) drug therapy: Secondary | ICD-10-CM | POA: Diagnosis not present

## 2022-07-02 DIAGNOSIS — I480 Paroxysmal atrial fibrillation: Secondary | ICD-10-CM | POA: Diagnosis not present

## 2022-07-03 ENCOUNTER — Other Ambulatory Visit: Payer: Self-pay | Admitting: Cardiology

## 2022-07-03 DIAGNOSIS — Z79899 Other long term (current) drug therapy: Secondary | ICD-10-CM

## 2022-07-03 DIAGNOSIS — Z7901 Long term (current) use of anticoagulants: Secondary | ICD-10-CM

## 2022-07-03 DIAGNOSIS — I480 Paroxysmal atrial fibrillation: Secondary | ICD-10-CM

## 2022-07-03 MED ORDER — APIXABAN 2.5 MG PO TABS: 2.5000 mg | ORAL_TABLET | Freq: Two times a day (BID) | ORAL | 0 refills | Status: DC

## 2022-07-03 NOTE — Progress Notes (Signed)
ON-CALL CARDIOLOGY 07/03/22  Patient's name: Kelly Blackwell.   MRN: [id number].    DOB: 1941-07-06 Primary care provider: Donnajean Lopes, MD. Primary cardiologist: Rex Kras, DO, [facility]  Interaction regarding this patient's care today: Patient is noted to have chronic hyponatremia.  However she is also on antiarrhythmic medications and therefore recommend optimal electrolyte management.  One of the contributing factors could be spironolactone.  Impression:   ICD-10-CM   1. Long term (current) use of anticoagulants  Z79.01 apixaban (ELIQUIS) 2.5 MG TABS tablet    Hemoglobin and hematocrit, blood    2. Paroxysmal atrial fibrillation (HCC)  I48.0 apixaban (ELIQUIS) 2.5 MG TABS tablet    Hemoglobin and hematocrit, blood    Basic metabolic panel    3. Long term current use of antiarrhythmic drug  Q94.765 Basic metabolic panel      Meds ordered this encounter  Medications   apixaban (ELIQUIS) 2.5 MG TABS tablet    Sig: Take 1 tablet (2.5 mg total) by mouth 2 (two) times daily.    Dispense:  180 tablet    Refill:  0    Orders Placed This Encounter  Procedures   Hemoglobin and hematocrit, blood   Basic metabolic panel    Recommendations: Discontinue spironolactone for now. Check blood pressures for 1 week to reevaluate the trend. Labs in 1 week to check sodium levels. Patient is also requesting refill on Eliquis. Will add H&H as well.  Telephone encounter total time: 5 minutes.   Rex Kras, [place], [facility]  Pager: [phone] Office: [phone]

## 2022-07-03 NOTE — Telephone Encounter (Signed)
Called the patient see EMR.   Rex Kras, [place], [facility]  Pager: [phone] Office: [phone]

## 2022-07-15 ENCOUNTER — Other Ambulatory Visit: Payer: Self-pay | Admitting: Cardiology

## 2022-07-15 DIAGNOSIS — Z7901 Long term (current) use of anticoagulants: Secondary | ICD-10-CM | POA: Diagnosis not present

## 2022-07-15 DIAGNOSIS — I10 Essential (primary) hypertension: Secondary | ICD-10-CM

## 2022-07-16 ENCOUNTER — Other Ambulatory Visit: Payer: Self-pay

## 2022-07-16 DIAGNOSIS — I480 Paroxysmal atrial fibrillation: Secondary | ICD-10-CM

## 2022-07-16 DIAGNOSIS — Z79899 Other long term (current) drug therapy: Secondary | ICD-10-CM

## 2022-07-16 DIAGNOSIS — Z7901 Long term (current) use of anticoagulants: Secondary | ICD-10-CM

## 2022-07-16 LAB — HEMOGLOBIN: Hemoglobin: 12.7 g/dL (ref 11.1–15.9)

## 2022-07-16 LAB — BASIC METABOLIC PANEL
BUN/Creatinine Ratio: 20 (ref 12–28)
BUN: 18 mg/dL (ref 8–27)
CO2: 25 mmol/L (ref 20–29)
Calcium: 9.9 mg/dL (ref 8.7–10.3)
Chloride: 95 mmol/L — ABNORMAL LOW (ref 96–106)
Creatinine, Ser: 0.88 mg/dL (ref 0.57–1.00)
Glucose: 75 mg/dL (ref 70–99)
Potassium: 4.8 mmol/L (ref 3.5–5.2)
Sodium: 131 mmol/L — ABNORMAL LOW (ref 134–144)
eGFR: 66 mL/min/{1.73_m2} (ref >59)

## 2022-07-16 LAB — HEMATOCRIT: Hematocrit: 38.6 % (ref 34.0–46.6)

## 2022-07-16 LAB — SPECIMEN STATUS REPORT

## 2022-07-20 ENCOUNTER — Other Ambulatory Visit: Payer: Self-pay | Admitting: Cardiology

## 2022-07-20 DIAGNOSIS — I10 Essential (primary) hypertension: Secondary | ICD-10-CM

## 2022-07-20 MED ORDER — LOSARTAN POTASSIUM 100 MG PO TABS: 100.0000 mg | ORAL_TABLET | Freq: Every day | ORAL | 0 refills | Status: DC

## 2022-07-20 NOTE — Addendum Note (Signed)
Addended by: Oran Rein on: 07/20/2022 09:35 AM   Modules accepted: Orders

## 2022-07-20 NOTE — Progress Notes (Signed)
ON-CALL CARDIOLOGY 07/20/22  Patient's name: Kelly Blackwell.   MRN: [id number].    DOB: [birth year] Primary care provider: Donnajean Lopes, MD. Primary cardiologist: Rex Kras, DO, [facility]  Interaction regarding this patient's care today: Labs from 07/07/2022 reviewed.  Continues to be mildly hyponatremic (likely at baseline when compared to levels a year ago).   Had to go back on spironolactone due to elevated blood pressures around Christmas time.  Her blood pressures still remain around 150 mmHg on current blood pressure regimen: Losartan 50 mg p.o. every morning. Spironolactone 25 mg p.o. every morning. Metoprolol 25 mg p.o. every morning  Impression:   ICD-10-CM   1. Benign hypertension  I10       No orders of the defined types were placed in this encounter.   Orders Placed This Encounter  Procedures   Basic metabolic panel   Hemoglobin   Hematocrit   Specimen status report    Recommendations: I have asked her to increase losartan to 100 mg p.o. every afternoon Labs in 1 week Have asked her to discuss her hyponatremia with  primary care provider at the next office visit.  Telephone encounter total time: 7 minutes  Mechele Claude [facility]  Pager: [phone] Office: [phone]

## 2022-07-28 ENCOUNTER — Other Ambulatory Visit: Payer: Self-pay | Admitting: Cardiology

## 2022-07-28 DIAGNOSIS — I10 Essential (primary) hypertension: Secondary | ICD-10-CM

## 2022-07-29 DIAGNOSIS — I10 Essential (primary) hypertension: Secondary | ICD-10-CM | POA: Diagnosis not present

## 2022-07-30 LAB — BASIC METABOLIC PANEL
BUN/Creatinine Ratio: 19 (ref 12–28)
BUN: 17 mg/dL (ref 8–27)
CO2: 23 mmol/L (ref 20–29)
Calcium: 9.7 mg/dL (ref 8.7–10.3)
Chloride: 99 mmol/L (ref 96–106)
Creatinine, Ser: 0.88 mg/dL (ref 0.57–1.00)
Glucose: 109 mg/dL — ABNORMAL HIGH (ref 70–99)
Potassium: 4.3 mmol/L (ref 3.5–5.2)
Sodium: 136 mmol/L (ref 134–144)
eGFR: 66 mL/min/{1.73_m2} (ref >59)

## 2022-07-30 NOTE — Progress Notes (Signed)
Spoke with patient about lab results. She acknowledged understanding and had no further questions.

## 2022-08-02 ENCOUNTER — Other Ambulatory Visit: Payer: Self-pay | Admitting: Cardiology

## 2022-08-02 DIAGNOSIS — I10 Essential (primary) hypertension: Secondary | ICD-10-CM

## 2022-08-03 ENCOUNTER — Other Ambulatory Visit: Payer: Self-pay | Admitting: Cardiology

## 2022-08-03 DIAGNOSIS — I10 Essential (primary) hypertension: Secondary | ICD-10-CM

## 2022-08-04 ENCOUNTER — Other Ambulatory Visit: Payer: Self-pay

## 2022-08-04 DIAGNOSIS — I10 Essential (primary) hypertension: Secondary | ICD-10-CM

## 2022-08-04 MED ORDER — LOSARTAN POTASSIUM 100 MG PO TABS: 100.0000 mg | ORAL_TABLET | Freq: Every day | ORAL | 0 refills | Status: DC

## 2022-08-05 ENCOUNTER — Other Ambulatory Visit: Payer: Self-pay | Admitting: Cardiology

## 2022-08-05 ENCOUNTER — Other Ambulatory Visit: Payer: Self-pay

## 2022-08-05 DIAGNOSIS — I10 Essential (primary) hypertension: Secondary | ICD-10-CM

## 2022-08-05 MED ORDER — LOSARTAN POTASSIUM 100 MG PO TABS: 100.0000 mg | ORAL_TABLET | Freq: Every day | ORAL | 0 refills | Status: DC

## 2022-08-05 MED ORDER — LOSARTAN POTASSIUM 100 MG PO TABS: 100.0000 mg | ORAL_TABLET | Freq: Every day | ORAL | 3 refills | Status: DC

## 2022-08-10 ENCOUNTER — Ambulatory Visit
Admission: RE | Admit: 2022-08-10 | Discharge: 2022-08-10 | Disposition: A | Discharge status: Home / Self Care | Payer: PPO | Source: Ambulatory Visit | Attending: Hematology and Oncology | Admitting: Hematology and Oncology

## 2022-08-10 DIAGNOSIS — Z1231 Encounter for screening mammogram for malignant neoplasm of breast: Secondary | ICD-10-CM

## 2022-08-12 DIAGNOSIS — Z79899 Other long term (current) drug therapy: Secondary | ICD-10-CM | POA: Diagnosis not present

## 2022-08-12 DIAGNOSIS — I480 Paroxysmal atrial fibrillation: Secondary | ICD-10-CM | POA: Diagnosis not present

## 2022-08-12 DIAGNOSIS — Z7901 Long term (current) use of anticoagulants: Secondary | ICD-10-CM | POA: Diagnosis not present

## 2022-08-13 LAB — BASIC METABOLIC PANEL
BUN/Creatinine Ratio: 22 (ref 12–28)
BUN: 23 mg/dL (ref 8–27)
CO2: 21 mmol/L (ref 20–29)
Calcium: 9.9 mg/dL (ref 8.7–10.3)
Chloride: 97 mmol/L (ref 96–106)
Creatinine, Ser: 1.05 mg/dL — ABNORMAL HIGH (ref 0.57–1.00)
Glucose: 96 mg/dL (ref 70–99)
Potassium: 4.9 mmol/L (ref 3.5–5.2)
Sodium: 133 mmol/L — ABNORMAL LOW (ref 134–144)
eGFR: 53 mL/min/{1.73_m2} — ABNORMAL LOW (ref >59)

## 2022-08-13 LAB — HEMOGLOBIN AND HEMATOCRIT, BLOOD
Hematocrit: 37.7 % (ref 34.0–46.6)
Hemoglobin: 12.9 g/dL (ref 11.1–15.9)

## 2022-08-25 NOTE — Progress Notes (Signed)
Called and spoke with patient, I advised her to increase fluid intake, patient voiced understanding.

## 2022-09-21 NOTE — Progress Notes (Signed)
Patient Care Team: Donnajean Lopes, MD as PCP - General (Internal Medicine) Nicholas Lose, MD as Consulting Physician (Hematology and Oncology) Eppie Gibson, MD as Attending Physician (Radiation Oncology) Donnie Mesa, MD as Consulting Physician (General Surgery)  DIAGNOSIS: No diagnosis found.  SUMMARY OF ONCOLOGIC HISTORY: Oncology History  Carcinoma of upper-inner quadrant of right breast in female, estrogen receptor positive ([place])  07/31/2018 Initial Diagnosis   Screening detected right breast mass at 1 o'clock position 1.1 cm, no axillary lymph nodes, biopsy revealed IDC grade 1-2, ER 100%, PR 80%, Ki-67 5%, HER-2 negative, 2+ by IHC and ratio 1.22 by FISH, T1CN0 stage Ia   07/31/2018 Cancer Staging   Staging form: Breast, AJCC 8th Edition - Clinical: Stage IA (cT1c, cN0, cM0, G1, ER+, PR+, HER2-) - Signed by Eppie Gibson, MD on 07/31/2018   08/03/2018 Surgery   Right lumpectomy: IDC 0.6 cm, grade 2, margins negative, 0/4 lymph nodes negative, ER 100%, PR 80%, HER-2 equivocal by IHC, FISH negative ratio 1.95, Ki-67 5%, T1BN0 stage Ia   09/15/2018 - 10/10/2018 Radiation Therapy   Adjuvant radiation with Dr. Isidore Moos   10/10/2018 -  Anti-estrogen oral therapy   Anastrozole, '1mg'$  daily, plan for 5 years     CHIEF COMPLIANT:   INTERVAL HISTORY: Kelly Blackwell is a   ALLERGIES:  is allergic to sulfa antibiotics.  MEDICATIONS:  Current Outpatient Medications  Medication Sig Dispense Refill   acetaminophen (TYLENOL) 500 MG tablet Take 500-1,000 mg by mouth every 6 (six) hours as needed (pain).     ALPRAZolam (XANAX) 0.25 MG tablet Take 0.25 mg by mouth See admin instructions. Take 1 tablet (0.25 mg) by mouth scheduled at night for sleep; may take an additional dose during the day if needed for anxiety     anastrozole (ARIMIDEX) 1 MG tablet Take 1 tablet (1 mg total) by mouth daily. 90 tablet 3   apixaban (ELIQUIS) 2.5 MG TABS tablet Take 1 tablet (2.5 mg total) by mouth 2  (two) times daily. 180 tablet 0   atorvastatin (LIPITOR) 10 MG tablet Take 10 mg by mouth in the morning.     Calcium Carb-Cholecalciferol (CALCIUM 600 + D PO) Take 1 tablet by mouth 2 (two) times daily.     cetirizine (ZYRTEC) 10 MG tablet Take 10 mg by mouth daily.     cholecalciferol (VITAMIN D3) 25 MCG (1000 UT) tablet Take 1,000 Units by mouth in the morning.     flecainide (TAMBOCOR) 100 MG tablet Take 1 tablet (100 mg total) by mouth 2 (two) times daily. 180 tablet 1   losartan (COZAAR) 100 MG tablet Take 1 tablet (100 mg total) by mouth daily. 90 tablet 3   methocarbamol (ROBAXIN) 500 MG tablet Take 250 mg by mouth at bedtime. Leg cramps     metoprolol succinate (TOPROL-XL) 25 MG 24 hr tablet Take 1 tablet (25 mg total) by mouth daily. 90 tablet 1   nitroGLYCERIN (NITROSTAT) 0.4 MG SL tablet DISSOLVE 1 TABLET UNDER THE TONGUE EVERY 5 MINUTES AS NEEDED FOR CHEST PAIN. IF YOU REQUIRE MORE THAN TWO TABLETS FIVE MINUTES APART GO TO THE NEAREST ER VIA EMS. (Patient taking differently: Place 0.4 mg under the tongue every 5 (five) minutes x 3 doses as needed for chest pain.) 100 tablet 1   omeprazole (PRILOSEC) 20 MG capsule Take 20 mg by mouth daily.     spironolactone (ALDACTONE) 25 MG tablet TAKE 1 TABLET BY MOUTH EVERY DAY IN THE MORNING 90 tablet 0  No current facility-administered medications for this visit.    PHYSICAL EXAMINATION: ECOG PERFORMANCE STATUS: {CHL ONC ECOG PS:[phone]}  There were no vitals filed for this visit. There were no vitals filed for this visit.  BREAST:*** No palpable masses or nodules in either right or left breasts. No palpable axillary supraclavicular or infraclavicular adenopathy no breast tenderness or nipple discharge. (exam performed in the presence of a chaperone)  LABORATORY DATA:  I have reviewed the data as listed    Latest Ref Rng & Units 08/12/2022   10:52 AM 07/29/2022   10:59 AM 07/15/2022   11:15 AM  CMP  Glucose 70 - 99 mg/dL 96  109   75   BUN 8 - 27 mg/dL '23  17  18   '$ Creatinine 0.57 - 1.00 mg/dL 1.05  0.88  0.88   Sodium 134 - 144 mmol/L 133  136  131   Potassium 3.5 - 5.2 mmol/L 4.9  4.3  4.8   Chloride 96 - 106 mmol/L 97  99  95   CO2 20 - 29 mmol/L '21  23  25   '$ Calcium 8.7 - 10.3 mg/dL 9.9  9.7  9.9     Lab Results  Component Value Date   WBC 4.9 07/14/2021   HGB 12.9 08/12/2022   HCT 37.7 08/12/2022   MCV 88 07/14/2021   PLT 221 07/14/2021    ASSESSMENT & PLAN:  No problem-specific Assessment & Plan notes found for this encounter.    No orders of the defined types were placed in this encounter.  The patient has a good understanding of the overall plan. she agrees with it. she will call with any problems that may develop before the next visit here. Total time spent: 30 mins including face to face time and time spent for planning, charting and co-ordination of care   Suzzette Righter, [place] 09/21/22    I Gardiner Coins am acting as a Education administrator for [employer]  ***

## 2022-09-23 ENCOUNTER — Other Ambulatory Visit: Payer: Self-pay

## 2022-09-23 ENCOUNTER — Inpatient Hospital Stay: Payer: PPO | Attending: Hematology and Oncology | Admitting: Hematology and Oncology

## 2022-09-23 ENCOUNTER — Telehealth: Payer: Self-pay | Admitting: Hematology and Oncology

## 2022-09-23 VITALS — BP 149/58 | HR 60 | Temp 97.8°F | Resp 18 | Ht 61.0 in | Wt 124.8 lb

## 2022-09-23 DIAGNOSIS — C50211 Malignant neoplasm of upper-inner quadrant of right female breast: Secondary | ICD-10-CM | POA: Diagnosis not present

## 2022-09-23 DIAGNOSIS — M8580 Other specified disorders of bone density and structure, unspecified site: Secondary | ICD-10-CM | POA: Insufficient documentation

## 2022-09-23 DIAGNOSIS — Z170 Estrogen receptor positive status [ER+]: Secondary | ICD-10-CM | POA: Diagnosis not present

## 2022-09-23 DIAGNOSIS — Z780 Asymptomatic menopausal state: Secondary | ICD-10-CM

## 2022-09-23 DIAGNOSIS — Z79811 Long term (current) use of aromatase inhibitors: Secondary | ICD-10-CM | POA: Insufficient documentation

## 2022-09-23 MED ORDER — ANASTROZOLE 1 MG PO TABS: 1.0000 mg | ORAL_TABLET | Freq: Every day | ORAL | 3 refills | Status: DC

## 2022-09-23 NOTE — Telephone Encounter (Signed)
Scheduled appointment per 3/7 los. Left voicemail.

## 2022-09-23 NOTE — Assessment & Plan Note (Signed)
07/17/2018:Screening detected right breast mass at 1 o'clock position 1.1 cm, no axillary lymph nodes, biopsy revealed IDC grade 1-2, ER 100%, PR 80%, Ki-67 5%, HER-2 negative, 2+ by IHC and ratio 1.22 by FISH, T1CN0 stage Ia   08/03/2018:Right lumpectomy: IDC 0.6 cm, grade 2, margins negative, 0/4 lymph nodes negative, ER 100%, PR 80%, HER-2 equivocal by IHC, FISH negative ratio 1.95, Ki-67 5%, T1BN0 stage Ia   Bone density 08/29/2018:T score -2: Osteopenia I ordered a new bone density test to be done in the next 2 to 3 months.   Treatment plan: Adjuvant antiestrogen therapy with anastrozole 1 mg daily since March 2020. Anastrozole toxicities: none   Breast cancer surveillance: 08/11/2022: Mammogram: Benign breast density category C 09/23/2022 physical examination of breasts: Benign     Return to clinic annually for follow-ups.

## 2022-09-24 ENCOUNTER — Other Ambulatory Visit: Payer: Self-pay | Admitting: Cardiology

## 2022-09-24 DIAGNOSIS — I480 Paroxysmal atrial fibrillation: Secondary | ICD-10-CM

## 2022-09-24 DIAGNOSIS — Z7901 Long term (current) use of anticoagulants: Secondary | ICD-10-CM

## 2022-09-30 ENCOUNTER — Encounter (HOSPITAL_BASED_OUTPATIENT_CLINIC_OR_DEPARTMENT_OTHER): Payer: Self-pay | Admitting: Emergency Medicine

## 2022-09-30 ENCOUNTER — Other Ambulatory Visit: Payer: Self-pay

## 2022-09-30 ENCOUNTER — Emergency Department (HOSPITAL_BASED_OUTPATIENT_CLINIC_OR_DEPARTMENT_OTHER)
Admission: EM | Admit: 2022-09-30 | Discharge: 2022-09-30 | Disposition: A | Discharge status: Home / Self Care | Payer: PPO | Attending: Emergency Medicine | Admitting: Emergency Medicine

## 2022-09-30 DIAGNOSIS — Z853 Personal history of malignant neoplasm of breast: Secondary | ICD-10-CM | POA: Diagnosis not present

## 2022-09-30 DIAGNOSIS — K529 Noninfective gastroenteritis and colitis, unspecified: Secondary | ICD-10-CM | POA: Insufficient documentation

## 2022-09-30 DIAGNOSIS — R42 Dizziness and giddiness: Secondary | ICD-10-CM | POA: Diagnosis not present

## 2022-09-30 DIAGNOSIS — Z79899 Other long term (current) drug therapy: Secondary | ICD-10-CM | POA: Insufficient documentation

## 2022-09-30 DIAGNOSIS — R197 Diarrhea, unspecified: Secondary | ICD-10-CM | POA: Diagnosis present

## 2022-09-30 DIAGNOSIS — W01198A Fall on same level from slipping, tripping and stumbling with subsequent striking against other object, initial encounter: Secondary | ICD-10-CM | POA: Insufficient documentation

## 2022-09-30 DIAGNOSIS — Z7901 Long term (current) use of anticoagulants: Secondary | ICD-10-CM | POA: Insufficient documentation

## 2022-09-30 LAB — COMPREHENSIVE METABOLIC PANEL
ALT: 21 U/L (ref 0–44)
AST: 21 U/L (ref 15–41)
Albumin: 4.5 g/dL (ref 3.5–5.0)
Alkaline Phosphatase: 44 U/L (ref 38–126)
Anion gap: 14 (ref 5–15)
BUN: 30 mg/dL — ABNORMAL HIGH (ref 8–23)
CO2: 20 mmol/L — ABNORMAL LOW (ref 22–32)
Calcium: 10.3 mg/dL (ref 8.9–10.3)
Chloride: 98 mmol/L (ref 98–111)
Creatinine, Ser: 1.03 mg/dL — ABNORMAL HIGH (ref 0.44–1.00)
GFR, Estimated: 55 mL/min — ABNORMAL LOW (ref >60)
Glucose, Bld: 149 mg/dL — ABNORMAL HIGH (ref 70–99)
Potassium: 4.1 mmol/L (ref 3.5–5.1)
Sodium: 132 mmol/L — ABNORMAL LOW (ref 135–145)
Total Bilirubin: 0.8 mg/dL (ref 0.3–1.2)
Total Protein: 7.5 g/dL (ref 6.5–8.1)

## 2022-09-30 LAB — CBC
HCT: 44.7 % (ref 36.0–46.0)
Hemoglobin: 14.9 g/dL (ref 12.0–15.0)
MCH: 29.7 pg (ref 26.0–34.0)
MCHC: 33.3 g/dL (ref 30.0–36.0)
MCV: 89 fL (ref 80.0–100.0)
Platelets: 250 10*3/uL (ref 150–400)
RBC: 5.02 MIL/uL (ref 3.87–5.11)
RDW: 12.3 % (ref 11.5–15.5)
WBC: 10 10*3/uL (ref 4.0–10.5)
nRBC: 0 % (ref 0.0–0.2)

## 2022-09-30 LAB — LIPASE, BLOOD: Lipase: 13 U/L (ref 11–51)

## 2022-09-30 MED ORDER — ONDANSETRON HCL 4 MG/2ML IJ SOLN
4.0000 mg | Freq: Once | INTRAMUSCULAR | Status: AC
  Administered 2022-09-30: 4 mg via INTRAVENOUS
  Filled 2022-09-30: qty 2

## 2022-09-30 MED ORDER — ONDANSETRON 4 MG PO TBDP: 4.0000 mg | ORAL_TABLET | Freq: Three times a day (TID) | ORAL | 0 refills | Status: DC | PRN

## 2022-09-30 MED ORDER — LACTATED RINGERS IV BOLUS
1000.0000 mL | Freq: Once | INTRAVENOUS | Status: AC
  Administered 2022-09-30: 1000 mL via INTRAVENOUS

## 2022-09-30 NOTE — ED Provider Notes (Signed)
[facility] Provider Note   CSN: [id number] Arrival date & time: 09/30/22  1548     History  Chief Complaint  Patient presents with   Nausea   Emesis   Diarrhea    Kelly Blackwell is a 82 y.o. female.  81 year old female with history of atrial fibrillation and breast cancer admission who presents to the emergency department with nausea vomiting and diarrhea.  Patient reports that since 1 AM she has been having significant nausea vomiting and diarrhea.  Has had innumerable episodes of nonbloody nonbilious emesis.  Has also had frequent diarrhea that is nonbloody nonmelanotic.  No abdominal pain.  Husband started developing similar symptoms on Tuesday.  Reports that she was going to the restroom last night and started feeling very dizzy and fell back hitting her bottom.  Says she has been able to walk since then and her pain is very mild.  Has a history of low sodium so wanted to have her sodium checked with her GI losses.  History of cholecystectomy       Home Medications Prior to Admission medications   Medication Sig Start Date End Date Taking? Authorizing Provider  ondansetron (ZOFRAN-ODT) 4 MG disintegrating tablet Take 1 tablet (4 mg total) by mouth every 8 (eight) hours as needed for nausea or vomiting. 09/30/22  Yes Fransico Meadow, MD  acetaminophen (TYLENOL) 500 MG tablet Take 500-1,000 mg by mouth every 6 (six) hours as needed (pain).    [provider]  ALPRAZolam Duanne Moron) 0.25 MG tablet Take 0.25 mg by mouth See admin instructions. Take 1 tablet (0.25 mg) by mouth scheduled at night for sleep; may take an additional dose during the day if needed for anxiety    [provider]  anastrozole (ARIMIDEX) 1 MG tablet Take 1 tablet (1 mg total) by mouth daily. 09/23/22   Nicholas Lose, MD  apixaban (ELIQUIS) 2.5 MG TABS tablet TAKE 1 TABLET BY MOUTH TWICE A DAY 09/24/22   Tolia, Sunit, DO  atorvastatin (LIPITOR) 10 MG  tablet Take 10 mg by mouth in the morning.    [provider]  Calcium Carb-Cholecalciferol (CALCIUM 600 + D PO) Take 1 tablet by mouth 2 (two) times daily.    [provider]  cetirizine (ZYRTEC) 10 MG tablet Take 10 mg by mouth daily.    [provider]  cholecalciferol (VITAMIN D3) 25 MCG (1000 UT) tablet Take 1,000 Units by mouth in the morning.    [provider]  flecainide (TAMBOCOR) 100 MG tablet Take 1 tablet (100 mg total) by mouth 2 (two) times daily. 05/14/22 11/10/22  Tolia, Sunit, DO  losartan (COZAAR) 100 MG tablet Take 1 tablet (100 mg total) by mouth daily. 08/05/22   Tolia, Sunit, DO  methocarbamol (ROBAXIN) 500 MG tablet Take 250 mg by mouth at bedtime. Leg cramps 05/12/20   [provider]  metoprolol succinate (TOPROL-XL) 25 MG 24 hr tablet Take 1 tablet (25 mg total) by mouth daily. 05/14/22 11/10/22  Tolia, Sunit, DO  nitroGLYCERIN (NITROSTAT) 0.4 MG SL tablet DISSOLVE 1 TABLET UNDER THE TONGUE EVERY 5 MINUTES AS NEEDED FOR CHEST PAIN. IF YOU REQUIRE MORE THAN TWO TABLETS FIVE MINUTES APART GO TO THE NEAREST ER VIA EMS. Patient taking differently: Place 0.4 mg under the tongue every 5 (five) minutes x 3 doses as needed for chest pain. 02/11/20   Tolia, Sunit, DO  omeprazole (PRILOSEC) 20 MG capsule Take 20 mg by mouth daily.  [provider]  spironolactone (ALDACTONE) 25 MG tablet TAKE 1 TABLET BY MOUTH EVERY DAY IN THE MORNING 07/28/22   Tolia, Sunit, DO      Allergies    Sulfa antibiotics    Review of Systems   Review of Systems  Physical Exam Updated Vital Signs BP (!) 107/94 (BP Location: Right Arm)   Pulse 92   Temp 98.2 F (36.8 C)   Resp 16   Ht '5\' 1"'$  (1.549 m)   Wt 56.2 kg   SpO2 95%   BMI 23.43 kg/m  Physical Exam Vitals and nursing note reviewed.  Constitutional:      General: She is not in acute distress.    Appearance: She is well-developed.  HENT:     Head: Normocephalic and atraumatic.      Right Ear: External ear normal.     Left Ear: External ear normal.     Nose: Nose normal.  Eyes:     Extraocular Movements: Extraocular movements intact.     Conjunctiva/sclera: Conjunctivae normal.     Pupils: Pupils are equal, round, and reactive to light.  Cardiovascular:     Rate and Rhythm: Normal rate. Rhythm irregular.  Pulmonary:     Effort: Pulmonary effort is normal. No respiratory distress.  Abdominal:     General: Abdomen is flat. There is no distension.     Palpations: Abdomen is soft. There is no mass.     Tenderness: There is no abdominal tenderness. There is no guarding.  Musculoskeletal:     Cervical back: Normal range of motion and neck supple.     Right lower leg: No edema.     Left lower leg: No edema.     Comments: No lumbar spinal tenderness palpation or step-offs noted.  No hip tenderness to palpation bilaterally.  Skin:    General: Skin is warm and dry.  Neurological:     Mental Status: She is alert and oriented to person, place, and time. Mental status is at baseline.  Psychiatric:        Mood and Affect: Mood normal.     ED Results / Procedures / Treatments   Labs (all labs ordered are listed, but only abnormal results are displayed) Labs Reviewed  COMPREHENSIVE METABOLIC PANEL - Abnormal; Notable for the following components:      Result Value   Sodium 132 (*)    CO2 20 (*)    Glucose, Bld 149 (*)    BUN 30 (*)    Creatinine, Ser 1.03 (*)    GFR, Estimated 55 (*)    All other components within normal limits  LIPASE, BLOOD  CBC    EKG None  Radiology No results found.  Procedures Procedures   Medications Ordered in ED Medications  lactated ringers bolus 1,000 mL (0 mLs Intravenous Stopped 09/30/22 1939)  ondansetron (ZOFRAN) injection 4 mg (4 mg Intravenous Given 09/30/22 1827)    ED Course/ Medical Decision Making/ A&P                             Medical Decision Making Amount and/or Complexity of Data Reviewed Labs:  ordered.  Risk Prescription drug management.   Kelly Blackwell is a 82 y.o. female with comorbidities that complicate the patient evaluation including atrial fibrillation and breast cancer in remission who presents to the emergency department with nausea vomiting and diarrhea without abdominal pain and husband with similar symptoms  Initial  Ddx:  Gastroenteritis, intra-abdominal abscess, dehydration, AKI, electrolyte abnormality, fracture  MDM:  With the patient sick contact feel that she likely has gastroenteritis.  Has reassuring abdominal exam do not feel that cross-sectional imaging is indicated at this time.  Will check lab work to assess for AKI or electrolyte abnormality and give fluids and Zofran at this time.  Low concern for sacral and hip fracture since the patient is not tender there and has been ambulatory since the incident.  Plan:  Labs IV fluids Zofran  ED Summary/Re-evaluation:  Patient reassessed and was feeling much better after the above interventions.  Was able to tolerate p.o. and walk without difficulty.  Was given a prescription of Zofran and instructed to follow-up with primary care in 2 to 3 days regarding their symptoms.  This patient presents to the ED for concern of complaints listed in HPI, this involves an extensive number of treatment options, and is a complaint that carries with it a high risk of complications and morbidity. Disposition including potential need for admission considered.   Dispo: DC Home. Return precautions discussed including, but not limited to, those listed in the AVS. Allowed pt time to ask questions which were answered fully prior to dc.  Additional history obtained from spouse Records reviewed Outpatient Clinic Notes The following labs were independently interpreted: Chemistry and show no acute abnormality I have reviewed the patients home medications and made adjustments as needed Social Determinants of health:  Elderly  Final  Clinical Impression(s) / ED Diagnoses Final diagnoses:  Gastroenteritis    Rx / DC Orders ED Discharge Orders          Ordered    ondansetron (ZOFRAN-ODT) 4 MG disintegrating tablet  Every 8 hours PRN        09/30/22 1945              Fransico Meadow, MD 09/30/22 1946

## 2022-09-30 NOTE — Discharge Instructions (Signed)
You were seen for your viral bug (gastroenteritis) in the emergency department.   At home, please take the Zofran for your nausea and vomiting.  Please be sure to stay well-hydrated.    Follow-up with your primary doctor in 2-3 days regarding your visit.    Return immediately to the emergency department if you experience any of the following: Dizziness or fainting, abdominal pain, high fevers, or any other concerning symptoms.    Thank you for visiting our Emergency Department. It was a pleasure taking care of you today.

## 2022-09-30 NOTE — ED Triage Notes (Signed)
Pt arrives to ED with c/o nausea, vomiting, and diarrhea that started this morning. Pt notes that her husband has the same symptoms. Associated with dizziness.

## 2022-09-30 NOTE — ED Notes (Signed)
Pt stated she is feeling better and ready to go home anytime.

## 2022-10-07 DIAGNOSIS — G4733 Obstructive sleep apnea (adult) (pediatric): Secondary | ICD-10-CM | POA: Diagnosis not present

## 2022-10-24 ENCOUNTER — Other Ambulatory Visit: Payer: Self-pay | Admitting: Cardiology

## 2022-10-24 DIAGNOSIS — I10 Essential (primary) hypertension: Secondary | ICD-10-CM

## 2022-11-12 ENCOUNTER — Encounter: Payer: Self-pay | Admitting: Cardiology

## 2022-11-12 ENCOUNTER — Ambulatory Visit: Payer: PPO | Admitting: Cardiology

## 2022-11-12 VITALS — BP 142/63 | HR 56 | Ht 61.0 in | Wt 122.0 lb

## 2022-11-12 DIAGNOSIS — I10 Essential (primary) hypertension: Secondary | ICD-10-CM | POA: Diagnosis not present

## 2022-11-12 DIAGNOSIS — Z79899 Other long term (current) drug therapy: Secondary | ICD-10-CM

## 2022-11-12 DIAGNOSIS — Z7901 Long term (current) use of anticoagulants: Secondary | ICD-10-CM

## 2022-11-12 DIAGNOSIS — I480 Paroxysmal atrial fibrillation: Secondary | ICD-10-CM | POA: Diagnosis not present

## 2022-11-12 DIAGNOSIS — Z9289 Personal history of other medical treatment: Secondary | ICD-10-CM

## 2022-11-12 DIAGNOSIS — E782 Mixed hyperlipidemia: Secondary | ICD-10-CM

## 2022-11-12 DIAGNOSIS — G4733 Obstructive sleep apnea (adult) (pediatric): Secondary | ICD-10-CM | POA: Diagnosis not present

## 2022-11-12 MED ORDER — FLECAINIDE ACETATE 50 MG PO TABS: 50.0000 mg | ORAL_TABLET | Freq: Two times a day (BID) | ORAL | 0 refills | Status: DC

## 2022-11-12 NOTE — Progress Notes (Signed)
Kelly Blackwell Date of Birth: [birth year] MRN: [id number] Cardiologist:  Tessa Lerner, DO, [facility] (established care 11/15/2019) Former Cardiology Providers: Dr. Yates Decamp  Date: 11/12/22 Last Office Visit: 05/14/2022  Chief Complaint  Patient presents with   Atrial Fibrillation   Follow-up   HPI  Kelly Blackwell is a 82 y.o. female whose past medical history and cardiovascular risk factors are: Sleep Apnea on CPAP, paroxysmal atrial fibrillation, hypertension, hyperlipidemia, history of breast cancer, postmenopausal female, advanced age.   Patient presents to the office for management of paroxysmal atrial fibrillation and benign essential hypertension.  With regards to her paroxysmal atrial fibrillation she is undergone cardioversion x 2 (February 2022, January 2023).  She is on oral anticoagulation for thromboembolic prophylaxis.  She does not endorse evidence of bleeding.  In the past she is requested assistance with blood pressure management.  At her most recent home blood pressure log reviewed.  Since January 2024 her blood pressures are overall well-controlled.  However at times she does have readings lower than 100 mmHg or greater than 140 mmHg.  But on visual estimation average SBP is around 130 mmHg.  She denies anginal chest pain or heart failure symptoms.  FUNCTIONAL STATUS: She walks 5 days a week for 30-45 minutes.    ALLERGIES: Allergies  Allergen Reactions   Sulfa Antibiotics Rash and Other (See Comments)    Fever and rash     MEDICATION LIST PRIOR TO VISIT: Current Meds  Medication Sig   acetaminophen (TYLENOL) 500 MG tablet Take 500-1,000 mg by mouth every 6 (six) hours as needed (pain).   ALPRAZolam (XANAX) 0.25 MG tablet Take 0.25 mg by mouth See admin instructions. Take 1 tablet (0.25 mg) by mouth scheduled at night for sleep; may take an additional dose during the day if needed for anxiety   anastrozole (ARIMIDEX) 1 MG tablet Take 1 tablet (1 mg total) by  mouth daily.   apixaban (ELIQUIS) 2.5 MG TABS tablet TAKE 1 TABLET BY MOUTH TWICE A DAY   atorvastatin (LIPITOR) 10 MG tablet Take 10 mg by mouth in the morning.   Calcium Carb-Cholecalciferol (CALCIUM 600 + D PO) Take 1 tablet by mouth 2 (two) times daily.   cholecalciferol (VITAMIN D3) 25 MCG (1000 UT) tablet Take 1,000 Units by mouth in the morning.   flecainide (TAMBOCOR) 50 MG tablet Take 1 tablet (50 mg total) by mouth 2 (two) times daily.   losartan (COZAAR) 100 MG tablet Take 1 tablet (100 mg total) by mouth daily.   meloxicam (MOBIC) 7.5 MG tablet Take 7.5 mg by mouth daily.   methocarbamol (ROBAXIN) 500 MG tablet Take 250 mg by mouth at bedtime. Leg cramps   metoprolol succinate (TOPROL-XL) 25 MG 24 hr tablet Take 1 tablet (25 mg total) by mouth daily.   nitroGLYCERIN (NITROSTAT) 0.4 MG SL tablet DISSOLVE 1 TABLET UNDER THE TONGUE EVERY 5 MINUTES AS NEEDED FOR CHEST PAIN. IF YOU REQUIRE MORE THAN TWO TABLETS FIVE MINUTES APART GO TO THE NEAREST ER VIA EMS. (Patient taking differently: Place 0.4 mg under the tongue every 5 (five) minutes x 3 doses as needed for chest pain.)   omeprazole (PRILOSEC) 20 MG capsule Take 20 mg by mouth daily.   ondansetron (ZOFRAN-ODT) 4 MG disintegrating tablet Take 1 tablet (4 mg total) by mouth every 8 (eight) hours as needed for nausea or vomiting.   spironolactone (ALDACTONE) 25 MG tablet TAKE 1 TABLET BY MOUTH EVERY DAY IN THE MORNING   [DISCONTINUED] flecainide (  TAMBOCOR) 100 MG tablet Take 1 tablet (100 mg total) by mouth 2 (two) times daily.     PAST MEDICAL HISTORY: Past Medical History:  Diagnosis Date   Allergy    seasonal allergies.    Anxiety    Arthritis    Neck   Atrial fibrillation (HCC)    Breast cancer (HCC)    Hypercholesteremia    Hypertension    OSA on CPAP 12/2019   Osteopenia    Personal history of radiation therapy     PAST SURGICAL HISTORY: Past Surgical History:  Procedure Laterality Date   APPENDECTOMY     age 39    BREAST LUMPECTOMY Right 08/03/2018   BREAST LUMPECTOMY WITH RADIOACTIVE SEED AND SENTINEL LYMPH NODE BIOPSY Right 08/03/2018   Procedure: RIGHT BREAST LUMPECTOMY WITH RADIOACTIVE SEED AND SENTINEL LYMPH NODE BIOPSY WITH BLUE DYE INJECTION;  Surgeon: Manus Rudd, MD;  Location: [facility] OR;  Service: General;  Laterality: Right;   bunionectmy     1986   CARDIOVERSION N/A 09/03/2020   Procedure: CARDIOVERSION;  Surgeon: Tessa Lerner, DO;  Location: [facility] ENDOSCOPY;  Service: Cardiovascular;  Laterality: N/A;   CARDIOVERSION N/A 07/22/2021   Procedure: CARDIOVERSION;  Surgeon: Tessa Lerner, DO;  Location: [facility] ENDOSCOPY;  Service: Cardiovascular;  Laterality: N/A;   CHOLECYSTECTOMY     age 51   TONSILLECTOMY      FAMILY HISTORY: The patient family history includes AAA (abdominal aortic aneurysm) in her father; Atrial fibrillation in her mother; Colon cancer in her father; Coronary artery disease in her father; Heart failure in her mother; Hypertension in her mother; Osteoporosis in her mother.  SOCIAL HISTORY:  The patient  reports that she has never smoked. She has never used smokeless tobacco. She reports current alcohol use of about 1.0 standard drink of alcohol per week. She reports that she does not use drugs.  REVIEW OF SYSTEMS: Review of Systems  Cardiovascular:  Negative for chest pain, claudication, dyspnea on exertion, irregular heartbeat, leg swelling, near-syncope, orthopnea, palpitations, paroxysmal nocturnal dyspnea and syncope.  Respiratory:  Negative for shortness of breath.   Hematologic/Lymphatic: Negative for bleeding problem.  Musculoskeletal:  Negative for muscle cramps and myalgias.  Neurological:  Negative for dizziness and light-headedness.    PHYSICAL EXAM:    11/12/2022    2:42 PM 11/12/2022    2:35 PM 09/30/2022    8:01 PM  Vitals with BMI  Height  5\' 1"    Weight  122 lbs   BMI  23.06   Systolic 142 156 409  Diastolic 63 70 88  Pulse 56 51 90   Physical Exam   Constitutional: No distress.  Age appropriate, hemodynamically stable.   Neck: No JVD present.  Cardiovascular: Regular rhythm, S1 normal, S2 normal, intact distal pulses and normal pulses. Bradycardia present. Exam reveals no gallop, no S3 and no S4.  No murmur heard. Pulmonary/Chest: Effort normal and breath sounds normal. No stridor. She has no wheezes. She has no rales.  Abdominal: Soft. Bowel sounds are normal. She exhibits no distension. There is no abdominal tenderness.  Musculoskeletal:        General: No edema.     Cervical back: Neck supple.  Neurological: She is alert and oriented to person, place, and time. She has intact cranial nerves (2-12).  Skin: Skin is warm and moist.   CARDIAC DATABASE: EKG: 11/15/2019: Normal sinus rhythm, 79 bpm, normal axis, ST depressions in the inferior and lateral leads suggestive of possible ischemia.  Prior EKG dated  07/31/2018 noted normal sinus with PVCs and nonspecific ST abnormality.  09/03/2020: Normal sinus rhythm, 67 bpm, normal axis, first-degree AV block.  06/18/2021: Atrial fibrillation, 68 bpm, poor R wave progression, without underlying injury pattern.   November 12, 2022: Sinus bradycardia, 55 bpm, first-degree AV block, poor R wave progression, low voltage in the precordial leads  Echocardiogram: 11/21/2019: LVEF 60 to 65%, mild LVH, indeterminate diastolic filling pattern, elevated LAP, mild MR, mild TR.  Stress Testing: Lexiscan (Walking with mod Bruce)Tetrofosmin Stress Test  11/19/2019:  Myocardial perfusion is normal.  Overall LV systolic function is normal without regional wall motion abnormalities.  Stress LV EF: 61%.  No previous exam available for comparison. Low risk study.   Heart Catheterization: None  Procedures: 09/03/2020: Direct-current cardioversion, 200 J x 1, atrial fibrillation converted to normal sinus rhythm.  July 22, 2021: Direct-current cardioversion, 200 J x 1 converted to NSR.  LABORATORY  DATA:    Latest Ref Rng & Units 09/30/2022    4:15 PM 08/12/2022   10:52 AM 07/15/2022   11:15 AM  CBC  WBC 4.0 - 10.5 K/uL 10.0     Hemoglobin 12.0 - 15.0 g/dL 16.1  09.6  04.5   Hematocrit 36.0 - 46.0 % 44.7  37.7  38.6   Platelets 150 - 400 K/uL 250          Latest Ref Rng & Units 09/30/2022    4:15 PM 08/12/2022   10:52 AM 07/29/2022   10:59 AM  CMP  Glucose 70 - 99 mg/dL 409  96  811   BUN 8 - 23 mg/dL 30  23  17    Creatinine 0.44 - 1.00 mg/dL 9.14  7.82  9.56   Sodium 135 - 145 mmol/L 132  133  136   Potassium 3.5 - 5.1 mmol/L 4.1  4.9  4.3   Chloride 98 - 111 mmol/L 98  97  99   CO2 22 - 32 mmol/L 20  21  23    Calcium 8.9 - 10.3 mg/dL 21.3  9.9  9.7   Total Protein 6.5 - 8.1 g/dL 7.5     Total Bilirubin 0.3 - 1.2 mg/dL 0.8     Alkaline Phos 38 - 126 U/L 44     AST 15 - 41 U/L 21     ALT 0 - 44 U/L 21      External Labs: Collected: 10/11/2019 Creatinine 0.8 mg/dL. eGFR: 69 mL/min per 1.73 m Lipid profile: Total cholesterol 169, triglycerides 54, HDL 79, LDL 79, non-HDL 90 Hemoglobin A1c: None  IMPRESSION:    ICD-10-CM   1. Paroxysmal atrial fibrillation (HCC)  I48.0 EKG 12-Lead    2. Long term current use of antiarrhythmic drug  Z79.899 flecainide (TAMBOCOR) 50 MG tablet    3. Long term (current) use of anticoagulants  Z79.01     4. Benign hypertension  I10     5. History of cardioversion  Z92.89     6. Mixed hyperlipidemia  E78.2     7. OSA on CPAP  G47.33        RECOMMENDATIONS: Kelly Blackwell is a 82 y.o. female whose past medical history and cardiac risk factors include:Sleep Apnea, atrial fibrillation, hypertension, hyperlipidemia, history of breast cancer, postmenopausal female, advanced age.   Paroxysmal atrial fibrillation (HCC)/direct-current cardioversion x2 Rate control: Toprol-XL. Rhythm control: Flecainide. Thromboembolic prophylaxis Eliquis. CHA2DS2-VASc SCORE is 33 (age >81, HTN, female gender) which correlates to 4.0 % risk of stroke  per year.  Initial  cardioversion in February 2022 and soon thereafter had ERAF.  Started on flecainide but did not convert to sinus rhythm and therefore underwent direct-current cardioversion in January 2023. She has remained in sinus rhythm since January 2023. EKG today shows sinus bradycardia with first-degree AV block. Her blood pressure log also notes of bradycardia at home. Given the bradycardia recommended discontinuation of flecainide and transition to another AAD.  However, patient does not want to come off of flecainide as she has done well with that. Shared decision is to reduce the flecainide dose to 50 mg p.o. twice daily. Repeat EKG in 1 week if she still remains in first-degree AV block and bradycardia will transition her to Multaq (as long as she is in SR) after appropriate washout for 5 days. Independently reviewed labs dated 09/30/2022  Long term (current) use of anticoagulants Indication: Paroxysmal atrial fibrillation. Does not endorse any evidence of bleeding. Hemoglobin Levels remain stable as of March 2024.  Long term current use of antiarrhythmic drug Indication: Atrial fibrillation. Given her bradycardia and first-degree AV block recommended discontinuation of flecainide and transition to another antiarrhythmic.  However, patient remains reluctant flecainide has allowed her to be in SR.  See above  Benign hypertension Home blood pressures are acceptable No medication changes warranted at this time. Reemphasized the importance of a low-salt diet.  Mixed hyperlipidemia Continue statin therapy, most recent lipid profile reviewed.  Currently managed by primary care provider.  OSA on CPAP Reemphasized the importance of device compliance.  FINAL MEDICATION LIST END OF ENCOUNTER: Meds ordered this encounter  Medications   flecainide (TAMBOCOR) 50 MG tablet    Sig: Take 1 tablet (50 mg total) by mouth 2 (two) times daily.    Dispense:  60 tablet    Refill:  0       Medications Discontinued During This Encounter  Medication Reason   cetirizine (ZYRTEC) 10 MG tablet Completed Course   flecainide (TAMBOCOR) 100 MG tablet Dose change      Current Outpatient Medications:    acetaminophen (TYLENOL) 500 MG tablet, Take 500-1,000 mg by mouth every 6 (six) hours as needed (pain)., Disp: , Rfl:    ALPRAZolam (XANAX) 0.25 MG tablet, Take 0.25 mg by mouth See admin instructions. Take 1 tablet (0.25 mg) by mouth scheduled at night for sleep; may take an additional dose during the day if needed for anxiety, Disp: , Rfl:    anastrozole (ARIMIDEX) 1 MG tablet, Take 1 tablet (1 mg total) by mouth daily., Disp: 90 tablet, Rfl: 3   apixaban (ELIQUIS) 2.5 MG TABS tablet, TAKE 1 TABLET BY MOUTH TWICE A DAY, Disp: 180 tablet, Rfl: 1   atorvastatin (LIPITOR) 10 MG tablet, Take 10 mg by mouth in the morning., Disp: , Rfl:    Calcium Carb-Cholecalciferol (CALCIUM 600 + D PO), Take 1 tablet by mouth 2 (two) times daily., Disp: , Rfl:    cholecalciferol (VITAMIN D3) 25 MCG (1000 UT) tablet, Take 1,000 Units by mouth in the morning., Disp: , Rfl:    flecainide (TAMBOCOR) 50 MG tablet, Take 1 tablet (50 mg total) by mouth 2 (two) times daily., Disp: 60 tablet, Rfl: 0   losartan (COZAAR) 100 MG tablet, Take 1 tablet (100 mg total) by mouth daily., Disp: 90 tablet, Rfl: 3   meloxicam (MOBIC) 7.5 MG tablet, Take 7.5 mg by mouth daily., Disp: , Rfl:    methocarbamol (ROBAXIN) 500 MG tablet, Take 250 mg by mouth at bedtime. Leg cramps, Disp: , Rfl:  metoprolol succinate (TOPROL-XL) 25 MG 24 hr tablet, Take 1 tablet (25 mg total) by mouth daily., Disp: 90 tablet, Rfl: 1   nitroGLYCERIN (NITROSTAT) 0.4 MG SL tablet, DISSOLVE 1 TABLET UNDER THE TONGUE EVERY 5 MINUTES AS NEEDED FOR CHEST PAIN. IF YOU REQUIRE MORE THAN TWO TABLETS FIVE MINUTES APART GO TO THE NEAREST ER VIA EMS. (Patient taking differently: Place 0.4 mg under the tongue every 5 (five) minutes x 3 doses as needed for  chest pain.), Disp: 100 tablet, Rfl: 1   omeprazole (PRILOSEC) 20 MG capsule, Take 20 mg by mouth daily., Disp: , Rfl:    ondansetron (ZOFRAN-ODT) 4 MG disintegrating tablet, Take 1 tablet (4 mg total) by mouth every 8 (eight) hours as needed for nausea or vomiting., Disp: 20 tablet, Rfl: 0   spironolactone (ALDACTONE) 25 MG tablet, TAKE 1 TABLET BY MOUTH EVERY DAY IN THE MORNING, Disp: 90 tablet, Rfl: 0  Orders Placed This Encounter  Procedures   EKG 12-Lead   --Continue cardiac medications as reconciled in final medication list. --Return in about 3 months (around 02/11/2023) for Follow up, A. fib. Or sooner if needed. --Continue follow-up with your primary care physician regarding the management of your other chronic comorbid conditions.  Patient's questions and concerns were addressed to her satisfaction. She voices understanding of the instructions provided during this encounter.   This note was created using a voice recognition software as a result there may be grammatical errors inadvertently enclosed that do not reflect the nature of this encounter. Every attempt is made to correct such errors.  Tessa Lerner, [place], [facility]  Pager:  [phone] Office: [phone]

## 2022-11-15 ENCOUNTER — Ambulatory Visit: Payer: PPO | Admitting: Cardiology

## 2022-11-16 ENCOUNTER — Other Ambulatory Visit: Payer: Self-pay | Admitting: Cardiology

## 2022-11-16 DIAGNOSIS — I480 Paroxysmal atrial fibrillation: Secondary | ICD-10-CM

## 2022-11-22 ENCOUNTER — Ambulatory Visit: Payer: PPO | Admitting: Cardiology

## 2022-11-22 DIAGNOSIS — I480 Paroxysmal atrial fibrillation: Secondary | ICD-10-CM

## 2022-11-22 DIAGNOSIS — Z79899 Other long term (current) drug therapy: Secondary | ICD-10-CM | POA: Diagnosis not present

## 2022-11-22 NOTE — Progress Notes (Signed)
Patient dose of flecainide was reduced from 100 mg p.o. twice daily to 50 mg p.o. twice daily  She is here for a 1 week follow-up for EKG check.  She still remains in sinus bradycardia but her PR interval has improved.  Will do a GXT to evaluate for widening of QRS and or exercise-induced arrhythmia.  As long as the GXT is low risk we will continue current medical therapy.  However if there is any concerning findings she will be seen sooner and we will have to change antiarrhythmic medications.  Patient is agreeable with the plan of care.  Gedalya Jim [place], DO, [facility]

## 2022-11-23 DIAGNOSIS — D1801 Hemangioma of skin and subcutaneous tissue: Secondary | ICD-10-CM | POA: Diagnosis not present

## 2022-11-23 DIAGNOSIS — L821 Other seborrheic keratosis: Secondary | ICD-10-CM | POA: Diagnosis not present

## 2022-11-23 DIAGNOSIS — L738 Other specified follicular disorders: Secondary | ICD-10-CM | POA: Diagnosis not present

## 2022-11-23 DIAGNOSIS — Z85828 Personal history of other malignant neoplasm of skin: Secondary | ICD-10-CM | POA: Diagnosis not present

## 2022-11-23 DIAGNOSIS — L570 Actinic keratosis: Secondary | ICD-10-CM | POA: Diagnosis not present

## 2022-11-29 ENCOUNTER — Other Ambulatory Visit: Payer: Self-pay | Admitting: Cardiology

## 2022-11-29 DIAGNOSIS — Z79899 Other long term (current) drug therapy: Secondary | ICD-10-CM

## 2022-11-30 NOTE — Telephone Encounter (Signed)
Pt needs refill

## 2022-12-10 ENCOUNTER — Ambulatory Visit: Payer: PPO

## 2022-12-10 ENCOUNTER — Telehealth: Payer: Self-pay

## 2022-12-10 DIAGNOSIS — Z79899 Other long term (current) drug therapy: Secondary | ICD-10-CM | POA: Diagnosis not present

## 2022-12-10 DIAGNOSIS — I480 Paroxysmal atrial fibrillation: Secondary | ICD-10-CM | POA: Diagnosis not present

## 2022-12-10 NOTE — Telephone Encounter (Signed)
See is in SR during the GXT today.  Spoke to her after the stress test.   Tessa Lerner, DO, [facility]

## 2022-12-10 NOTE — Telephone Encounter (Signed)
Patient left vm to let you know that she is back in A fib.   Call back number [phone]

## 2023-01-04 DIAGNOSIS — Z6822 Body mass index (BMI) 22.0-22.9, adult: Secondary | ICD-10-CM | POA: Diagnosis not present

## 2023-01-04 DIAGNOSIS — Z01419 Encounter for gynecological examination (general) (routine) without abnormal findings: Secondary | ICD-10-CM | POA: Diagnosis not present

## 2023-01-04 DIAGNOSIS — Z124 Encounter for screening for malignant neoplasm of cervix: Secondary | ICD-10-CM | POA: Diagnosis not present

## 2023-01-08 ENCOUNTER — Other Ambulatory Visit: Payer: Self-pay | Admitting: Cardiology

## 2023-01-08 DIAGNOSIS — Z79899 Other long term (current) drug therapy: Secondary | ICD-10-CM

## 2023-01-10 NOTE — Telephone Encounter (Signed)
yes

## 2023-01-11 DIAGNOSIS — G4733 Obstructive sleep apnea (adult) (pediatric): Secondary | ICD-10-CM | POA: Diagnosis not present

## 2023-01-31 DIAGNOSIS — M79602 Pain in left arm: Secondary | ICD-10-CM | POA: Diagnosis not present

## 2023-02-03 ENCOUNTER — Other Ambulatory Visit: Payer: Self-pay | Admitting: Cardiology

## 2023-02-03 DIAGNOSIS — Z79899 Other long term (current) drug therapy: Secondary | ICD-10-CM

## 2023-02-03 NOTE — Telephone Encounter (Signed)
Refill please.

## 2023-02-11 ENCOUNTER — Ambulatory Visit: Payer: PPO | Admitting: Cardiology

## 2023-02-13 ENCOUNTER — Other Ambulatory Visit: Payer: Self-pay | Admitting: Cardiology

## 2023-02-13 DIAGNOSIS — I10 Essential (primary) hypertension: Secondary | ICD-10-CM

## 2023-02-28 DIAGNOSIS — Z170 Estrogen receptor positive status [ER+]: Secondary | ICD-10-CM | POA: Diagnosis not present

## 2023-02-28 DIAGNOSIS — C50211 Malignant neoplasm of upper-inner quadrant of right female breast: Secondary | ICD-10-CM | POA: Diagnosis not present

## 2023-03-01 DIAGNOSIS — E785 Hyperlipidemia, unspecified: Secondary | ICD-10-CM | POA: Diagnosis not present

## 2023-03-01 DIAGNOSIS — E559 Vitamin D deficiency, unspecified: Secondary | ICD-10-CM | POA: Diagnosis not present

## 2023-03-01 DIAGNOSIS — I10 Essential (primary) hypertension: Secondary | ICD-10-CM | POA: Diagnosis not present

## 2023-03-07 ENCOUNTER — Encounter: Payer: Self-pay | Admitting: Cardiology

## 2023-03-07 ENCOUNTER — Ambulatory Visit: Payer: PPO | Admitting: Cardiology

## 2023-03-07 VITALS — BP 146/67 | HR 69 | Resp 16 | Ht 61.0 in | Wt 118.0 lb

## 2023-03-07 DIAGNOSIS — Z9289 Personal history of other medical treatment: Secondary | ICD-10-CM | POA: Diagnosis not present

## 2023-03-07 DIAGNOSIS — Z79899 Other long term (current) drug therapy: Secondary | ICD-10-CM

## 2023-03-07 DIAGNOSIS — I10 Essential (primary) hypertension: Secondary | ICD-10-CM | POA: Diagnosis not present

## 2023-03-07 DIAGNOSIS — E782 Mixed hyperlipidemia: Secondary | ICD-10-CM | POA: Diagnosis not present

## 2023-03-07 DIAGNOSIS — G4733 Obstructive sleep apnea (adult) (pediatric): Secondary | ICD-10-CM | POA: Diagnosis not present

## 2023-03-07 DIAGNOSIS — I480 Paroxysmal atrial fibrillation: Secondary | ICD-10-CM | POA: Diagnosis not present

## 2023-03-07 DIAGNOSIS — Z7901 Long term (current) use of anticoagulants: Secondary | ICD-10-CM

## 2023-03-07 NOTE — Progress Notes (Signed)
Kelly Blackwell Date of Birth: 1941-03-26 MRN: [id number] Cardiologist:  Tessa Lerner, DO, [facility] (established care 11/15/2019) Former Cardiology Providers: Dr. Yates Decamp  Date: 03/07/23 Last Office Visit: 11/22/2022  Chief Complaint  Patient presents with   Paroxysmal atrial fibrillation    Follow-up    3 months   HPI  Kelly Blackwell is a 82 y.o. female whose past medical history and cardiovascular risk factors are: Sleep Apnea on CPAP, paroxysmal atrial fibrillation, hypertension, hyperlipidemia, history of breast cancer, postmenopausal female, advanced age.   Patient is being followed by the practice for paroxysmal atrial fibrillation and benign essential hypertension.  She has undergone cardioversion x 2 (February 2022 and January 2023) she has been on medical therapy since then and is tolerating it well.  At the last office visit flecainide was reduced to 50 mg p.o. twice daily due to EKG changes.  She has undergone a repeat GXT results reviewed.  Clinically she is doing well.  She appreciates when she goes in and out of A-fib and this has not happened since last office visit.  She has also invested in a [place] mobile which she uses weekly and the rhythm remains in sinus.  Home blood pressure log reviewed.  Systolic blood pressures on visual estimation likely around 130 mmHg.  Overall she denies anginal chest pain or heart failure symptoms.  FUNCTIONAL STATUS: She walks 5 days a week for 30-45 minutes.    ALLERGIES: Allergies  Allergen Reactions   Sulfa Antibiotics Rash and Other (See Comments)    Fever and rash     MEDICATION LIST PRIOR TO VISIT: Current Meds  Medication Sig   acetaminophen (TYLENOL) 500 MG tablet Take 500-1,000 mg by mouth every 6 (six) hours as needed (pain).   ALPRAZolam (XANAX) 0.25 MG tablet Take 0.25 mg by mouth See admin instructions. Take 1 tablet (0.25 mg) by mouth scheduled at night for sleep; may take an additional dose during the day if needed  for anxiety   anastrozole (ARIMIDEX) 1 MG tablet Take 1 tablet (1 mg total) by mouth daily.   apixaban (ELIQUIS) 2.5 MG TABS tablet TAKE 1 TABLET BY MOUTH TWICE A DAY   atorvastatin (LIPITOR) 10 MG tablet Take 10 mg by mouth in the morning.   Calcium Carb-Cholecalciferol (CALCIUM 600 + D PO) Take 1 tablet by mouth 2 (two) times daily.   cholecalciferol (VITAMIN D3) 25 MCG (1000 UT) tablet Take 1,000 Units by mouth in the morning.   flecainide (TAMBOCOR) 50 MG tablet TAKE 1 TABLET BY MOUTH TWICE A DAY   losartan (COZAAR) 100 MG tablet Take 1 tablet (100 mg total) by mouth daily.   methocarbamol (ROBAXIN) 500 MG tablet Take 250 mg by mouth at bedtime. Leg cramps   metoprolol succinate (TOPROL-XL) 25 MG 24 hr tablet TAKE 1 TABLET (25 MG TOTAL) BY MOUTH DAILY.   nitroGLYCERIN (NITROSTAT) 0.4 MG SL tablet DISSOLVE 1 TABLET UNDER THE TONGUE EVERY 5 MINUTES AS NEEDED FOR CHEST PAIN. IF YOU REQUIRE MORE THAN TWO TABLETS FIVE MINUTES APART GO TO THE NEAREST ER VIA EMS. (Patient taking differently: Place 0.4 mg under the tongue every 5 (five) minutes x 3 doses as needed for chest pain.)   omeprazole (PRILOSEC) 20 MG capsule Take 20 mg by mouth daily.   ondansetron (ZOFRAN-ODT) 4 MG disintegrating tablet Take 1 tablet (4 mg total) by mouth every 8 (eight) hours as needed for nausea or vomiting.     PAST MEDICAL HISTORY: Past Medical History:  Diagnosis Date   Allergy    seasonal allergies.    Anxiety    Arthritis    Neck   Atrial fibrillation (HCC)    Breast cancer (HCC)    Hypercholesteremia    Hypertension    OSA on CPAP 12/2019   Osteopenia    Personal history of radiation therapy     PAST SURGICAL HISTORY: Past Surgical History:  Procedure Laterality Date   APPENDECTOMY     age 89   BREAST LUMPECTOMY Right 08/03/2018   BREAST LUMPECTOMY WITH RADIOACTIVE SEED AND SENTINEL LYMPH NODE BIOPSY Right 08/03/2018   Procedure: RIGHT BREAST LUMPECTOMY WITH RADIOACTIVE SEED AND SENTINEL LYMPH  NODE BIOPSY WITH BLUE DYE INJECTION;  Surgeon: Manus Rudd, MD;  Location: [facility] OR;  Service: General;  Laterality: Right;   bunionectmy     1986   CARDIOVERSION N/A 09/03/2020   Procedure: CARDIOVERSION;  Surgeon: Tessa Lerner, DO;  Location: [facility] ENDOSCOPY;  Service: Cardiovascular;  Laterality: N/A;   CARDIOVERSION N/A 07/22/2021   Procedure: CARDIOVERSION;  Surgeon: Tessa Lerner, DO;  Location: [facility] ENDOSCOPY;  Service: Cardiovascular;  Laterality: N/A;   CHOLECYSTECTOMY     age 82   TONSILLECTOMY      FAMILY HISTORY: The patient family history includes AAA (abdominal aortic aneurysm) in her father; Atrial fibrillation in her brother, brother, and mother; Colon cancer in her father; Coronary artery disease in her father; Heart failure in her mother; Hypertension in her mother; Osteoporosis in her mother.  SOCIAL HISTORY:  The patient  reports that she has never smoked. She has never used smokeless tobacco. She reports current alcohol use of about 1.0 standard drink of alcohol per week. She reports that she does not use drugs.  REVIEW OF SYSTEMS: Review of Systems  Cardiovascular:  Negative for chest pain, claudication, dyspnea on exertion, irregular heartbeat, leg swelling, near-syncope, orthopnea, palpitations, paroxysmal nocturnal dyspnea and syncope.  Respiratory:  Negative for shortness of breath.   Hematologic/Lymphatic: Negative for bleeding problem.  Musculoskeletal:  Negative for muscle cramps and myalgias.  Neurological:  Negative for dizziness and light-headedness.    PHYSICAL EXAM:    03/07/2023    9:25 AM 11/12/2022    2:42 PM 11/12/2022    2:35 PM  Vitals with BMI  Height 5\' 1"   5\' 1"   Weight 118 lbs  122 lbs  BMI 22.31  23.06  Systolic 146 142 621  Diastolic 67 63 70  Pulse 69 56 51   Physical Exam  Constitutional: No distress.  Age appropriate, hemodynamically stable.   Neck: No JVD present.  Cardiovascular: Regular rhythm, S1 normal, S2 normal, intact distal  pulses and normal pulses. Bradycardia present. Exam reveals no gallop, no S3 and no S4.  No murmur heard. Pulmonary/Chest: Effort normal and breath sounds normal. No stridor. She has no wheezes. She has no rales.  Abdominal: Soft. Bowel sounds are normal. She exhibits no distension. There is no abdominal tenderness.  Musculoskeletal:        General: No edema.     Cervical back: Neck supple.  Neurological: She is alert and oriented to person, place, and time. She has intact cranial nerves (2-12).  Skin: Skin is warm and moist.   CARDIAC DATABASE: EKG: 11/15/2019: Normal sinus rhythm, 79 bpm, normal axis, ST depressions in the inferior and lateral leads suggestive of possible ischemia.  Prior EKG dated 07/31/2018 noted normal sinus with PVCs and nonspecific ST abnormality.  09/03/2020: Normal sinus rhythm, 67 bpm, normal axis, first-degree AV block.  06/18/2021: Atrial fibrillation, 68 bpm, poor R wave progression, without underlying injury pattern.   03/07/2023: Sinus rhythm, 64 bpm, first-degree AV block, without underlying ischemia injury pattern.   Echocardiogram: 11/21/2019: LVEF 60 to 65%, mild LVH, indeterminate diastolic filling pattern, elevated LAP, mild MR, mild TR.  Stress Testing: Lexiscan (Walking with mod Bruce)Tetrofosmin Stress Test  11/19/2019:  Myocardial perfusion is normal.  Overall LV systolic function is normal without regional wall motion abnormalities.  Stress LV EF: 61%.  No previous exam available for comparison. Low risk study.   Heart Catheterization: None  Procedures: 09/03/2020: Direct-current cardioversion, 200 J x 1, atrial fibrillation converted to normal sinus rhythm.  July 22, 2021: Direct-current cardioversion, 200 J x 1 converted to NSR.  LABORATORY DATA:    Latest Ref Rng & Units 09/30/2022    4:15 PM 08/12/2022   10:52 AM 07/15/2022   11:15 AM  CBC  WBC 4.0 - 10.5 K/uL 10.0     Hemoglobin 12.0 - 15.0 g/dL 16.1  09.6  04.5   Hematocrit 36.0  - 46.0 % 44.7  37.7  38.6   Platelets 150 - 400 K/uL 250          Latest Ref Rng & Units 09/30/2022    4:15 PM 08/12/2022   10:52 AM 07/29/2022   10:59 AM  CMP  Glucose 70 - 99 mg/dL 409  96  811   BUN 8 - 23 mg/dL 30  23  17    Creatinine 0.44 - 1.00 mg/dL 9.14  7.82  9.56   Sodium 135 - 145 mmol/L 132  133  136   Potassium 3.5 - 5.1 mmol/L 4.1  4.9  4.3   Chloride 98 - 111 mmol/L 98  97  99   CO2 22 - 32 mmol/L 20  21  23    Calcium 8.9 - 10.3 mg/dL 21.3  9.9  9.7   Total Protein 6.5 - 8.1 g/dL 7.5     Total Bilirubin 0.3 - 1.2 mg/dL 0.8     Alkaline Phos 38 - 126 U/L 44     AST 15 - 41 U/L 21     ALT 0 - 44 U/L 21      External Labs: Collected: 10/11/2019 Creatinine 0.8 mg/dL. eGFR: 69 mL/min per 1.73 m Lipid profile: Total cholesterol 169, triglycerides 54, HDL 79, LDL 79, non-HDL 90 Hemoglobin A1c: None  IMPRESSION:    ICD-10-CM   1. Paroxysmal atrial fibrillation (HCC)  I48.0 EKG 12-Lead    2. Long term current use of antiarrhythmic drug  Z79.899     3. Long term (current) use of anticoagulants  Z79.01     4. Benign hypertension  I10     5. History of cardioversion  Z92.89     6. Mixed hyperlipidemia  E78.2     7. OSA on CPAP  G47.33        RECOMMENDATIONS: JESLYNN BALLI is a 82 y.o. female whose past medical history and cardiac risk factors include:Sleep Apnea, atrial fibrillation, hypertension, hyperlipidemia, history of breast cancer, postmenopausal female, advanced age.   Paroxysmal atrial fibrillation (HCC)/direct-current cardioversion x2 Rate control: Toprol-XL. Rhythm control: Flecainide. Thromboembolic prophylaxis Eliquis. CHA2DS2-VASc SCORE is 34 (age >66, HTN, female gender) which correlates to 4.0 % risk of stroke per year.  Initial cardioversion in February 2022 and soon thereafter had ERAF.  She has remained in sinus rhythm since January 2023. Has done well with the lower dose of flecainide 50 mg p.o. twice daily.  Long term (current) use  of anticoagulants Indication: Paroxysmal atrial fibrillation. Does not endorse any evidence of bleeding. Hemoglobin Levels remain stable as of March 2024. She had labs with PCP 03/02/2023-I do not have these records available for review.  Not available in Care Everywhere  Long term current use of antiarrhythmic drug Indication: Atrial fibrillation. Continue lower dose of flecainide 50 mg p.o. twice daily  Benign hypertension Home blood pressures are very well-controlled. Continue current medical therapy..  Mixed hyperlipidemia Continue atorvastatin. Currently managed by primary care provider.  OSA on CPAP Reemphasized the importance of device compliance.  FINAL MEDICATION LIST END OF ENCOUNTER: No orders of the defined types were placed in this encounter.     Medications Discontinued During This Encounter  Medication Reason   spironolactone (ALDACTONE) 25 MG tablet       Current Outpatient Medications:    acetaminophen (TYLENOL) 500 MG tablet, Take 500-1,000 mg by mouth every 6 (six) hours as needed (pain)., Disp: , Rfl:    ALPRAZolam (XANAX) 0.25 MG tablet, Take 0.25 mg by mouth See admin instructions. Take 1 tablet (0.25 mg) by mouth scheduled at night for sleep; may take an additional dose during the day if needed for anxiety, Disp: , Rfl:    anastrozole (ARIMIDEX) 1 MG tablet, Take 1 tablet (1 mg total) by mouth daily., Disp: 90 tablet, Rfl: 3   apixaban (ELIQUIS) 2.5 MG TABS tablet, TAKE 1 TABLET BY MOUTH TWICE A DAY, Disp: 180 tablet, Rfl: 1   atorvastatin (LIPITOR) 10 MG tablet, Take 10 mg by mouth in the morning., Disp: , Rfl:    Calcium Carb-Cholecalciferol (CALCIUM 600 + D PO), Take 1 tablet by mouth 2 (two) times daily., Disp: , Rfl:    cholecalciferol (VITAMIN D3) 25 MCG (1000 UT) tablet, Take 1,000 Units by mouth in the morning., Disp: , Rfl:    flecainide (TAMBOCOR) 50 MG tablet, TAKE 1 TABLET BY MOUTH TWICE A DAY, Disp: 180 tablet, Rfl: 1   losartan (COZAAR) 100  MG tablet, Take 1 tablet (100 mg total) by mouth daily., Disp: 90 tablet, Rfl: 3   methocarbamol (ROBAXIN) 500 MG tablet, Take 250 mg by mouth at bedtime. Leg cramps, Disp: , Rfl:    metoprolol succinate (TOPROL-XL) 25 MG 24 hr tablet, TAKE 1 TABLET (25 MG TOTAL) BY MOUTH DAILY., Disp: 90 tablet, Rfl: 1   nitroGLYCERIN (NITROSTAT) 0.4 MG SL tablet, DISSOLVE 1 TABLET UNDER THE TONGUE EVERY 5 MINUTES AS NEEDED FOR CHEST PAIN. IF YOU REQUIRE MORE THAN TWO TABLETS FIVE MINUTES APART GO TO THE NEAREST ER VIA EMS. (Patient taking differently: Place 0.4 mg under the tongue every 5 (five) minutes x 3 doses as needed for chest pain.), Disp: 100 tablet, Rfl: 1   omeprazole (PRILOSEC) 20 MG capsule, Take 20 mg by mouth daily., Disp: , Rfl:    ondansetron (ZOFRAN-ODT) 4 MG disintegrating tablet, Take 1 tablet (4 mg total) by mouth every 8 (eight) hours as needed for nausea or vomiting., Disp: 20 tablet, Rfl: 0   meloxicam (MOBIC) 7.5 MG tablet, Take 7.5 mg by mouth daily. (Patient not taking: Reported on 03/07/2023), Disp: , Rfl:   Orders Placed This Encounter  Procedures   EKG 12-Lead   --Continue cardiac medications as reconciled in final medication list. --Return in about 6 months (around 09/07/2023) for Follow up, A. fib, Review test results. Or sooner if needed. --Continue follow-up with your primary care physician regarding the management of your other chronic comorbid conditions.  Patient's  questions and concerns were addressed to her satisfaction. She voices understanding of the instructions provided during this encounter.   This note was created using a voice recognition software as a result there may be grammatical errors inadvertently enclosed that do not reflect the nature of this encounter. Every attempt is made to correct such errors.  Tessa Lerner, [place], [facility]  Pager:  [phone] Office: [phone]

## 2023-03-08 DIAGNOSIS — Z0000 Encounter for general adult medical examination without abnormal findings: Secondary | ICD-10-CM | POA: Diagnosis not present

## 2023-03-08 DIAGNOSIS — R82998 Other abnormal findings in urine: Secondary | ICD-10-CM | POA: Diagnosis not present

## 2023-03-08 DIAGNOSIS — I10 Essential (primary) hypertension: Secondary | ICD-10-CM | POA: Diagnosis not present

## 2023-03-08 DIAGNOSIS — G4733 Obstructive sleep apnea (adult) (pediatric): Secondary | ICD-10-CM | POA: Diagnosis not present

## 2023-03-08 DIAGNOSIS — D6869 Other thrombophilia: Secondary | ICD-10-CM | POA: Diagnosis not present

## 2023-03-08 DIAGNOSIS — Z1339 Encounter for screening examination for other mental health and behavioral disorders: Secondary | ICD-10-CM | POA: Diagnosis not present

## 2023-03-08 DIAGNOSIS — I4819 Other persistent atrial fibrillation: Secondary | ICD-10-CM | POA: Diagnosis not present

## 2023-03-08 DIAGNOSIS — F419 Anxiety disorder, unspecified: Secondary | ICD-10-CM | POA: Diagnosis not present

## 2023-03-08 DIAGNOSIS — Z1331 Encounter for screening for depression: Secondary | ICD-10-CM | POA: Diagnosis not present

## 2023-03-08 DIAGNOSIS — Z7901 Long term (current) use of anticoagulants: Secondary | ICD-10-CM | POA: Diagnosis not present

## 2023-03-08 DIAGNOSIS — Z853 Personal history of malignant neoplasm of breast: Secondary | ICD-10-CM | POA: Diagnosis not present

## 2023-03-22 ENCOUNTER — Inpatient Hospital Stay: Admission: RE | Admit: 2023-03-22 | Payer: PPO | Source: Ambulatory Visit

## 2023-03-22 DIAGNOSIS — R634 Abnormal weight loss: Secondary | ICD-10-CM | POA: Diagnosis not present

## 2023-03-22 DIAGNOSIS — R110 Nausea: Secondary | ICD-10-CM | POA: Diagnosis not present

## 2023-03-22 DIAGNOSIS — E871 Hypo-osmolality and hyponatremia: Secondary | ICD-10-CM | POA: Diagnosis not present

## 2023-03-24 DIAGNOSIS — R110 Nausea: Secondary | ICD-10-CM | POA: Diagnosis not present

## 2023-03-24 DIAGNOSIS — R634 Abnormal weight loss: Secondary | ICD-10-CM | POA: Diagnosis not present

## 2023-03-24 DIAGNOSIS — I10 Essential (primary) hypertension: Secondary | ICD-10-CM | POA: Diagnosis not present

## 2023-03-24 DIAGNOSIS — E871 Hypo-osmolality and hyponatremia: Secondary | ICD-10-CM | POA: Diagnosis not present

## 2023-03-25 ENCOUNTER — Other Ambulatory Visit: Payer: Self-pay | Admitting: Cardiology

## 2023-03-25 DIAGNOSIS — Z7901 Long term (current) use of anticoagulants: Secondary | ICD-10-CM

## 2023-03-25 DIAGNOSIS — I480 Paroxysmal atrial fibrillation: Secondary | ICD-10-CM

## 2023-04-01 ENCOUNTER — Encounter: Payer: Self-pay | Admitting: Cardiology

## 2023-04-01 NOTE — Telephone Encounter (Signed)
From patient.

## 2023-04-06 DIAGNOSIS — G4733 Obstructive sleep apnea (adult) (pediatric): Secondary | ICD-10-CM | POA: Diagnosis not present

## 2023-04-08 DIAGNOSIS — E871 Hypo-osmolality and hyponatremia: Secondary | ICD-10-CM | POA: Diagnosis not present

## 2023-04-27 ENCOUNTER — Inpatient Hospital Stay
Admission: RE | Admit: 2023-04-27 | Discharge: 2023-04-27 | Disposition: A | Discharge status: Home / Self Care | Payer: PPO | Source: Ambulatory Visit | Attending: Hematology and Oncology

## 2023-04-27 DIAGNOSIS — E349 Endocrine disorder, unspecified: Secondary | ICD-10-CM | POA: Diagnosis not present

## 2023-04-27 DIAGNOSIS — M8588 Other specified disorders of bone density and structure, other site: Secondary | ICD-10-CM | POA: Diagnosis not present

## 2023-04-27 DIAGNOSIS — C50211 Malignant neoplasm of upper-inner quadrant of right female breast: Secondary | ICD-10-CM

## 2023-04-27 DIAGNOSIS — N958 Other specified menopausal and perimenopausal disorders: Secondary | ICD-10-CM | POA: Diagnosis not present

## 2023-04-27 DIAGNOSIS — Z780 Asymptomatic menopausal state: Secondary | ICD-10-CM

## 2023-04-27 IMAGING — MG DIGITAL DIAGNOSTIC BILAT W/ TOMO W/ CAD
6 of 9 series · 6 of 25 positions shown · non-contrast
Comparison: Previous exam(s).

CLINICAL DATA: 80-year-old who underwent malignant lumpectomy of
the UPPER INNER QUADRANT of the RIGHT breast in July 2018, with
adjuvant radiation therapy. Annual evaluation.

EXAM:
DIGITAL DIAGNOSTIC BILATERAL MAMMOGRAM WITH TOMOSYNTHESIS AND CAD
TECHNIQUE: Bilateral digital diagnostic mammography and breast tomosynthesis
was performed. The images were evaluated with computer-aided
detection.

[R CC]
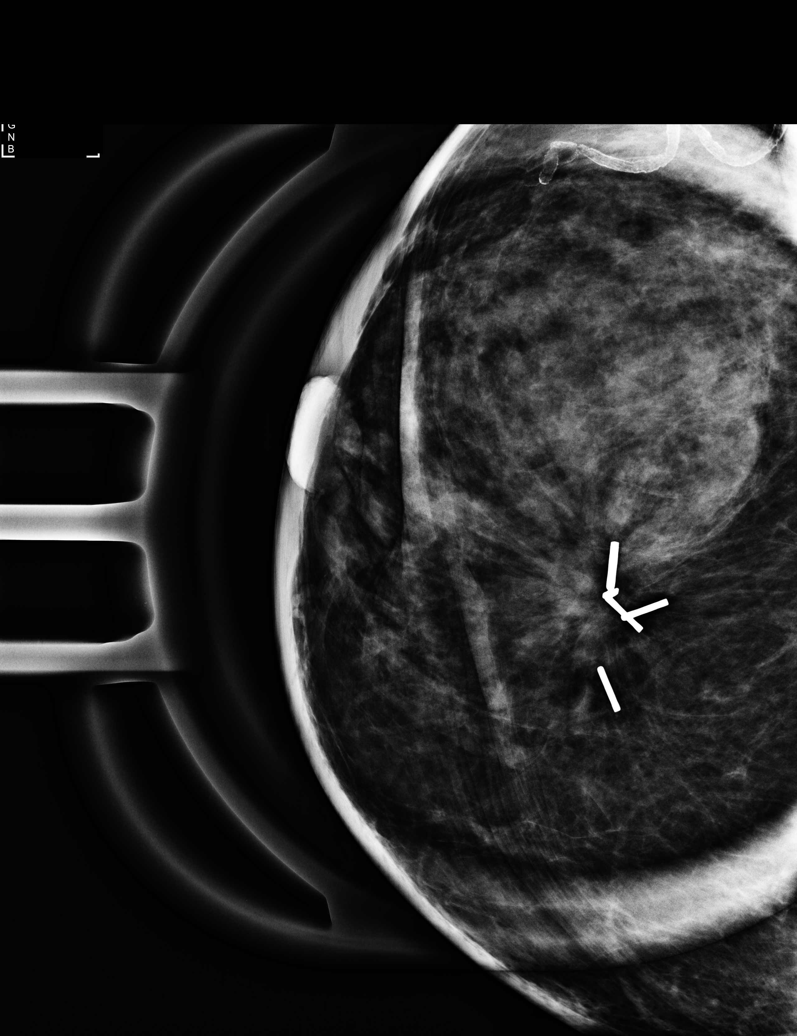

[L CC synth-2D]
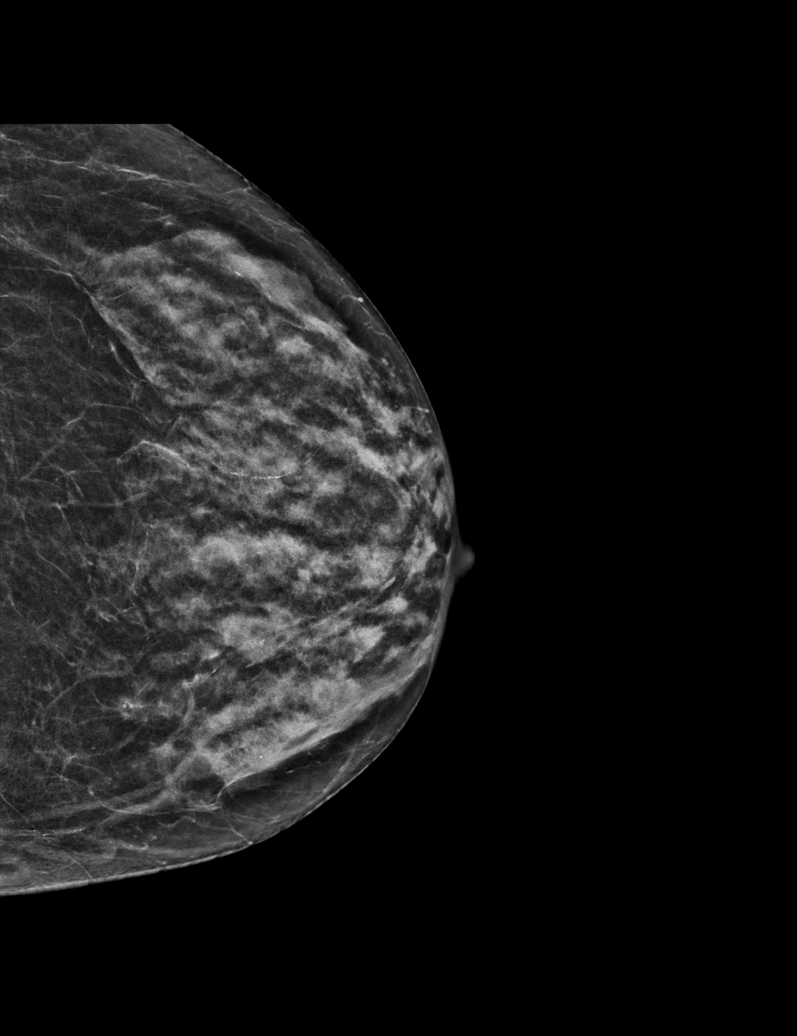

[R CC synth-2D]
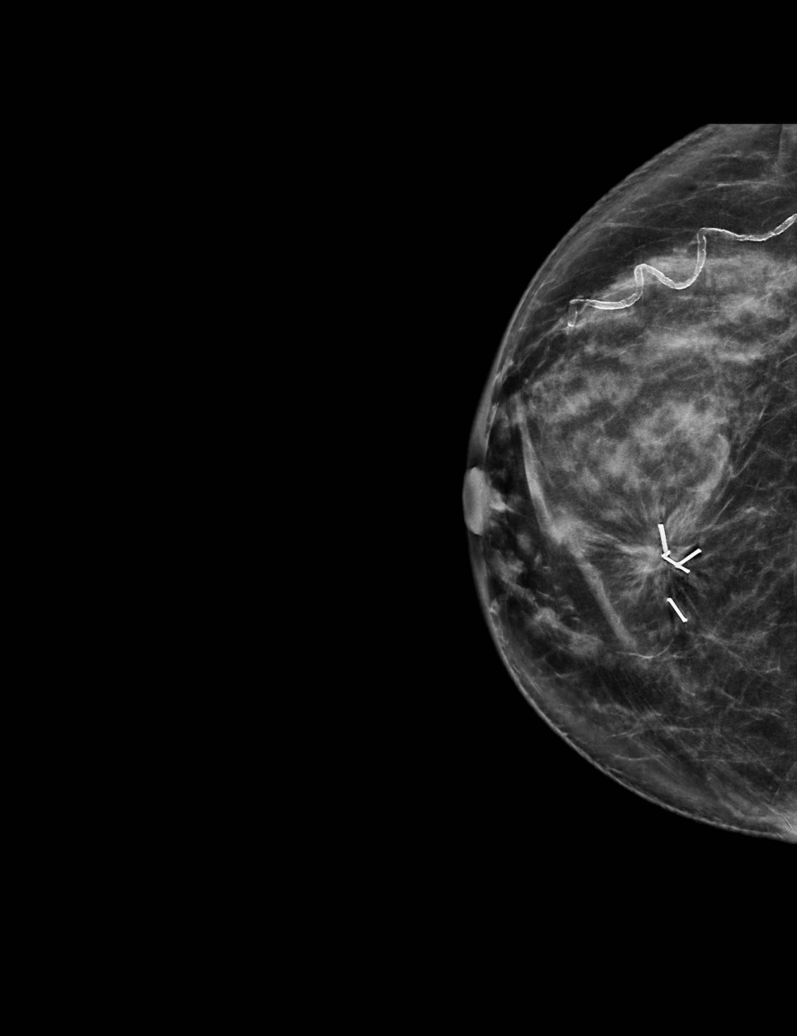

[L MLO synth-2D]
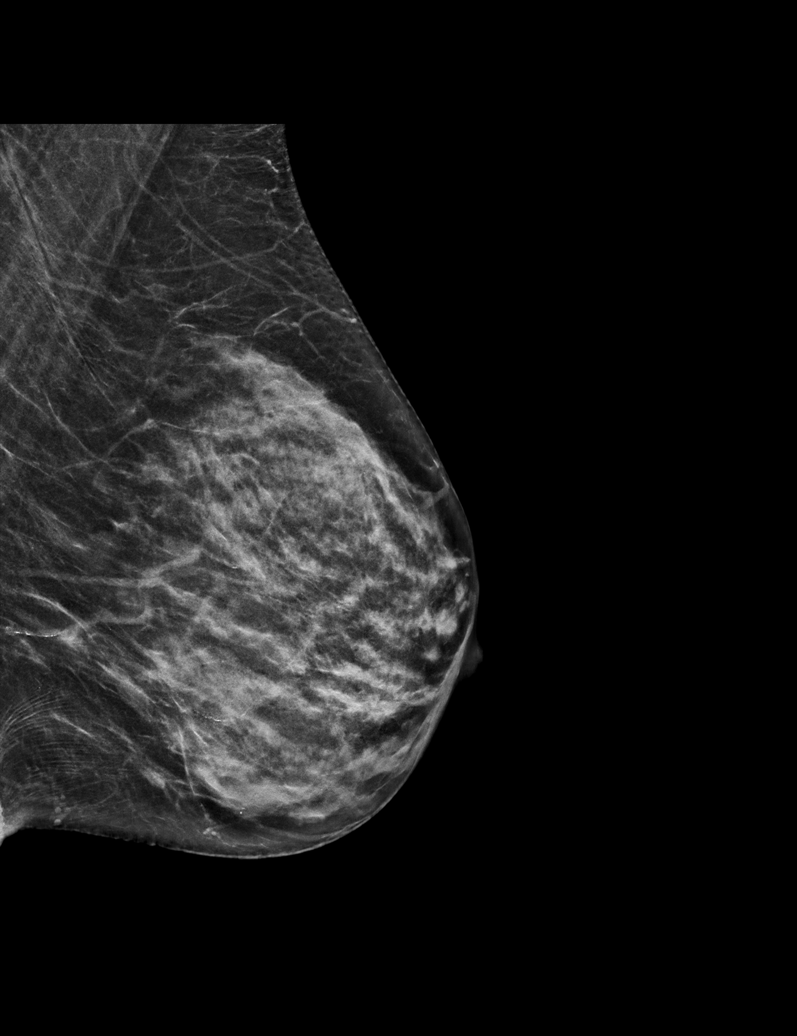

[R MLO synth-2D]
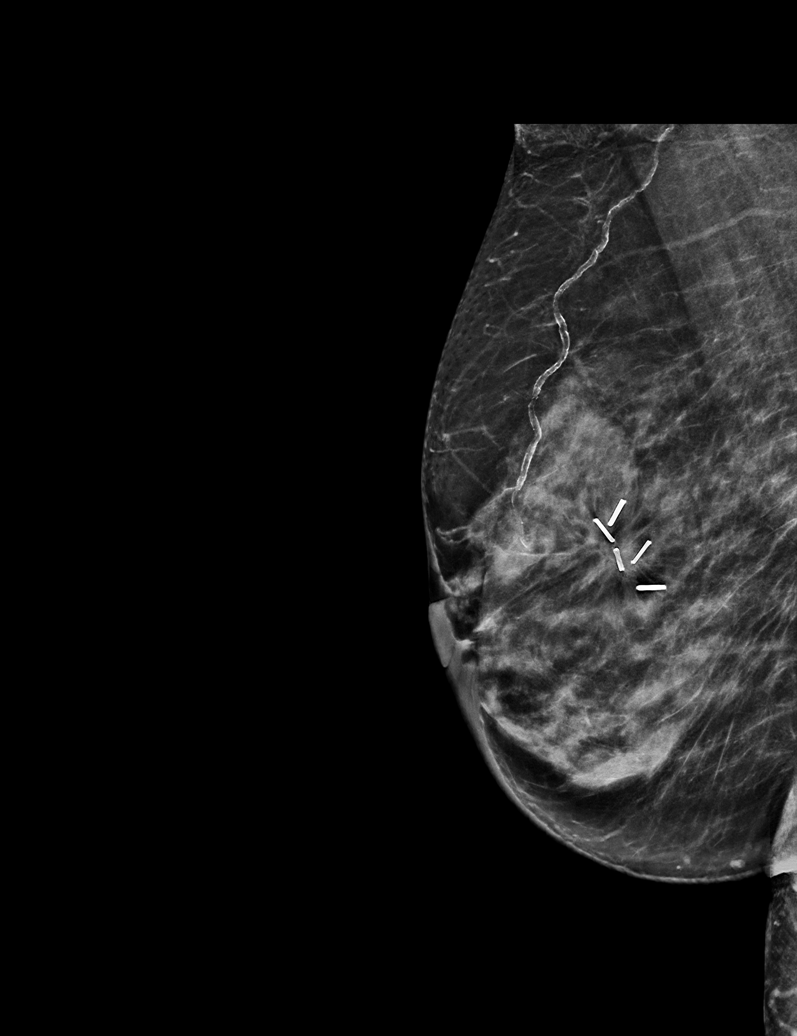

[L CC tomo · tomo slice 23/46.0]
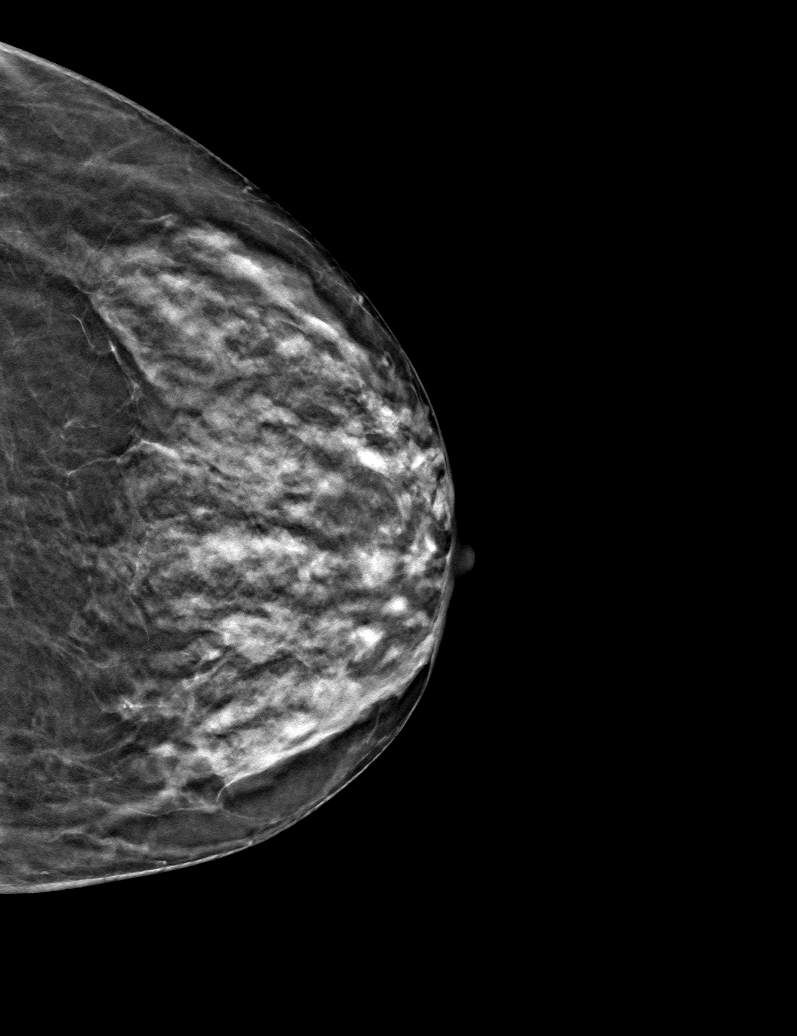

[6 of 25 positions shown; findings below may reference images not displayed]

ACR Breast Density Category c: The breast tissue is heterogeneously
dense, which may obscure small masses.
FINDINGS: Full field CC and MLO views of both breasts and a spot magnification
CC view of the lumpectomy site in the RIGHT breast were obtained.

RIGHT: No findings suspicious for malignancy. Post lumpectomy
scar/distortion in the UPPER INNER QUADRANT at middle depth.

LEFT: No findings suspicious for malignancy.
IMPRESSION: 1. No mammographic evidence of malignancy involving either breast.
2. Expected post lumpectomy changes involving the RIGHT breast.

RECOMMENDATION:
Per protocol, as the patient is now 2 or more years status post
lumpectomy, she may return to annual screening mammography in 1
year. However, given the history of breast cancer, the patient
remains eligible for annual diagnostic mammography if preferred.

I have discussed the findings and recommendations with the patient.
If applicable, a reminder letter will be sent to the patient
regarding the next appointment.

BI-RADS CATEGORY  2: Benign.

## 2023-05-11 ENCOUNTER — Other Ambulatory Visit: Payer: Self-pay | Admitting: Cardiology

## 2023-05-11 DIAGNOSIS — I480 Paroxysmal atrial fibrillation: Secondary | ICD-10-CM

## 2023-05-12 ENCOUNTER — Other Ambulatory Visit: Payer: Self-pay | Admitting: Cardiology

## 2023-05-12 DIAGNOSIS — I10 Essential (primary) hypertension: Secondary | ICD-10-CM

## 2023-05-31 DIAGNOSIS — G4733 Obstructive sleep apnea (adult) (pediatric): Secondary | ICD-10-CM | POA: Diagnosis not present

## 2023-05-31 DIAGNOSIS — L820 Inflamed seborrheic keratosis: Secondary | ICD-10-CM | POA: Diagnosis not present

## 2023-05-31 DIAGNOSIS — Z85828 Personal history of other malignant neoplasm of skin: Secondary | ICD-10-CM | POA: Diagnosis not present

## 2023-06-02 ENCOUNTER — Encounter: Payer: Self-pay | Admitting: Adult Health

## 2023-06-02 ENCOUNTER — Ambulatory Visit: Payer: PPO | Admitting: Adult Health

## 2023-06-02 VITALS — BP 115/61 | HR 66 | Ht 61.0 in | Wt 123.4 lb

## 2023-06-02 DIAGNOSIS — G4733 Obstructive sleep apnea (adult) (pediatric): Secondary | ICD-10-CM

## 2023-06-02 NOTE — Patient Instructions (Signed)
Continue using CPAP nightly and greater than 4 hours each night °If your symptoms worsen or you develop new symptoms please let us know.  ° °

## 2023-06-15 ENCOUNTER — Other Ambulatory Visit: Payer: Self-pay | Admitting: Cardiology

## 2023-06-15 DIAGNOSIS — I10 Essential (primary) hypertension: Secondary | ICD-10-CM

## 2023-06-28 ENCOUNTER — Other Ambulatory Visit: Payer: Self-pay | Admitting: Hematology and Oncology

## 2023-06-28 DIAGNOSIS — Z1231 Encounter for screening mammogram for malignant neoplasm of breast: Secondary | ICD-10-CM

## 2023-06-30 ENCOUNTER — Other Ambulatory Visit: Payer: Self-pay

## 2023-06-30 DIAGNOSIS — I10 Essential (primary) hypertension: Secondary | ICD-10-CM

## 2023-06-30 MED ORDER — AMLODIPINE BESYLATE 2.5 MG PO TABS: 2.5000 mg | ORAL_TABLET | Freq: Every day | ORAL | 0 refills | Status: DC

## 2023-06-30 NOTE — Progress Notes (Signed)
Per Dr. Emelda Brothers request, order for Amlodipine (Norvasc) 2.5 mg has been ordered and pt is aware of the new medication as well as the timeline below of how medications need to be taken:  AO:[id number] Succinate (Toprol-XL), Amlodipine (Norvasc)  PM: Losartan

## 2023-07-26 ENCOUNTER — Other Ambulatory Visit: Payer: Self-pay | Admitting: Cardiology

## 2023-07-26 DIAGNOSIS — I10 Essential (primary) hypertension: Secondary | ICD-10-CM

## 2023-08-02 ENCOUNTER — Other Ambulatory Visit: Payer: Self-pay | Admitting: Cardiology

## 2023-08-02 DIAGNOSIS — Z79899 Other long term (current) drug therapy: Secondary | ICD-10-CM

## 2023-08-12 ENCOUNTER — Ambulatory Visit
Admission: RE | Admit: 2023-08-12 | Discharge: 2023-08-12 | Disposition: A | Discharge status: Home / Self Care | Payer: PPO | Source: Ambulatory Visit | Attending: Hematology and Oncology

## 2023-08-12 DIAGNOSIS — Z1231 Encounter for screening mammogram for malignant neoplasm of breast: Secondary | ICD-10-CM | POA: Diagnosis not present

## 2023-09-08 ENCOUNTER — Ambulatory Visit: Payer: Self-pay | Admitting: Cardiology

## 2023-09-15 DIAGNOSIS — G4733 Obstructive sleep apnea (adult) (pediatric): Secondary | ICD-10-CM | POA: Diagnosis not present

## 2023-09-16 ENCOUNTER — Other Ambulatory Visit: Payer: Self-pay | Admitting: Cardiology

## 2023-09-16 ENCOUNTER — Ambulatory Visit: Payer: PPO | Attending: Cardiology | Admitting: Cardiology

## 2023-09-16 ENCOUNTER — Encounter: Payer: Self-pay | Admitting: Cardiology

## 2023-09-16 VITALS — BP 130/74 | HR 58 | Resp 16 | Ht 61.0 in | Wt 124.0 lb

## 2023-09-16 DIAGNOSIS — I480 Paroxysmal atrial fibrillation: Secondary | ICD-10-CM

## 2023-09-16 DIAGNOSIS — E782 Mixed hyperlipidemia: Secondary | ICD-10-CM | POA: Diagnosis not present

## 2023-09-16 DIAGNOSIS — G4733 Obstructive sleep apnea (adult) (pediatric): Secondary | ICD-10-CM

## 2023-09-16 DIAGNOSIS — I10 Essential (primary) hypertension: Secondary | ICD-10-CM

## 2023-09-16 DIAGNOSIS — Z7901 Long term (current) use of anticoagulants: Secondary | ICD-10-CM

## 2023-09-16 DIAGNOSIS — Z79899 Other long term (current) drug therapy: Secondary | ICD-10-CM

## 2023-09-16 NOTE — Telephone Encounter (Signed)
 Eliquis 2.5mg  refill request received. Patient is 83 years old, weight-56.2kg, Crea-1.03 on 09/30/22 & having labs drawn today, Diagnosis-Afib, and last seen by Dr. Odis Hollingshead on 09/16/23. Dose is appropriate based on dosing criteria. Will send in refill to requested pharmacy.

## 2023-09-16 NOTE — Patient Instructions (Signed)
 Medication Instructions:  Your physician recommends that you continue on your current medications as directed. Please refer to the Current Medication list given to you today.  *If you need a refill on your cardiac medications before your next appointment, please call your pharmacy*  Lab Work: To be completed today: BMP and hemoglobin/hematocrit   If you have labs (blood work) drawn today and your tests are completely normal, you will receive your results only by: MyChart Message (if you have MyChart) OR A paper copy in the mail If you have any lab test that is abnormal or we need to change your treatment, we will call you to review the results.  Testing/Procedures: None ordered today.  Follow-Up: At [facility], you and your health needs are our priority.  As part of our continuing mission to provide you with exceptional heart care, we have created designated Provider Care Teams.  These Care Teams include your primary Cardiologist (physician) and Advanced Practice Providers (APPs -  Physician Assistants and Nurse Practitioners) who all work together to provide you with the care you need, when you need it.   Your next appointment:   6 month(s)  The format for your next appointment:   In Person  Provider:   Tessa Lerner, DO {

## 2023-09-16 NOTE — Progress Notes (Signed)
 Cardiology Office Note:  .   Date:  09/16/2023  ID:  Kelly Blackwell, DOB Oct 05, 1940, MRN [id number] PCP:  Kelly Fillers, MD  Former Cardiology Providers: Dr. Yates Decamp [facility] Providers Cardiologist:  Tessa Lerner, DO , [facility] (established care 11/15/2019 ) Electrophysiologist:  None  Click to update primary MD,subspecialty MD or APP then REFRESH:1}    Chief Complaint  Patient presents with   Paroxysmal atrial fibrillation   Follow-up    History of Present Illness: .   [name] is a 83 y.o. Caucasian female whose past medical history and cardiovascular risk factors includes: Sleep Apnea on CPAP, paroxysmal atrial fibrillation, hypertension, hyperlipidemia, history of breast cancer, postmenopausal female, advanced age.   Patient is being followed by the practice given her history of paroxysmal atrial fibrillation and benign essential hypertension.  Sleep Apnea on CPAP, paroxysmal atrial fibrillation, hypertension, hyperlipidemia, history of breast cancer, postmenopausal female, advanced age.  In the past flecainide was reduced to 50 mg p.o. twice daily due to EKG changes on higher dose.  With the change in flecainide she did undergo a GXT.  She does appreciate when she goes in and out of A-fib and also utilizes Kardia mobile to verify her rhythm.  She presents today for follow-up.   Since last office visit patient stated she has predominantly been in sinus rhythm.  Tolerating AV nodal blocking agents, flecainide, and anticoagulation without any side effects or intolerances.  No recent blood work for review.  Plan blood pressures are within acceptable range.  Home blood pressure logs reviewed-SBP ranging between 110-135 mmHg.  Spironolactone was discontinued secondary to hyponatremia and started on low-dose amlodipine which she is tolerating well  Review of Systems: .   Review of Systems  Cardiovascular:  Negative for chest pain, claudication, irregular heartbeat,  leg swelling, near-syncope, orthopnea, palpitations, paroxysmal nocturnal dyspnea and syncope.  Respiratory:  Negative for shortness of breath.   Hematologic/Lymphatic: Negative for bleeding problem.    Studies Reviewed:   EKG: EKG Interpretation Date/Time:  Friday September 16 2023 08:42:52 EST Ventricular Rate:  57 PR Interval:  236 QRS Duration:  88 QT Interval:  418 QTC Calculation: 406 R Axis:   25  Text Interpretation: Sinus bradycardia with 1st degree A-V block When compared with ECG of 22-Jul-2021 13:10, No significant change was found Confirmed by Tessa Lerner [id number]) on 09/16/2023 8:52:01 AM  Echocardiogram: 11/21/2019: LVEF 60 to 65%, mild LVH, indeterminate diastolic filling pattern, elevated LAP, mild MR, mild TR.  Stress Testing: Lexiscan (Walking with mod Bruce)Tetrofosmin Stress Test  11/19/2019:  Myocardial perfusion is normal.  Overall LV systolic function is normal without regional wall motion abnormalities.  Stress LV EF: 61%.  No previous exam available for comparison. Low risk study.    Heart Catheterization: None   Procedures: 09/03/2020: Direct-current cardioversion, 200 J x 1, atrial fibrillation converted to normal sinus rhythm.   July 22, 2021: Direct-current cardioversion, 200 J x 1 converted to NSR.  RADIOLOGY: NA  Risk Assessment/Calculations:   Click Here to Calculate/Change CHADS2VASc Score The patient's CHADS2-VASc score is 4, indicating a 4.8% annual risk of stroke.   CHF History: No HTN History: Yes Diabetes History: No Stroke History: No Vascular Disease History: No    Labs:       Latest Ref Rng & Units 09/30/2022    4:15 PM 08/12/2022   10:52 AM 07/15/2022   11:15 AM  CBC  WBC 4.0 - 10.5 K/uL 10.0  Hemoglobin 12.0 - 15.0 g/dL 16.1  09.6  04.5   Hematocrit 36.0 - 46.0 % 44.7  37.7  38.6   Platelets 150 - 400 K/uL 250          Latest Ref Rng & Units 09/30/2022    4:15 PM 08/12/2022   10:52 AM 07/29/2022   10:59 AM  BMP   Glucose 70 - 99 mg/dL 409  96  811   BUN 8 - 23 mg/dL 30  23  17    Creatinine 0.44 - 1.00 mg/dL 9.14  7.82  9.56   BUN/Creat Ratio 12 - 28  22  19    Sodium 135 - 145 mmol/L 132  133  136   Potassium 3.5 - 5.1 mmol/L 4.1  4.9  4.3   Chloride 98 - 111 mmol/L 98  97  99   CO2 22 - 32 mmol/L 20  21  23    Calcium 8.9 - 10.3 mg/dL 21.3  9.9  9.7       Latest Ref Rng & Units 09/30/2022    4:15 PM 08/12/2022   10:52 AM 07/29/2022   10:59 AM  CMP  Glucose 70 - 99 mg/dL 086  96  578   BUN 8 - 23 mg/dL 30  23  17    Creatinine 0.44 - 1.00 mg/dL 4.69  6.29  5.28   Sodium 135 - 145 mmol/L 132  133  136   Potassium 3.5 - 5.1 mmol/L 4.1  4.9  4.3   Chloride 98 - 111 mmol/L 98  97  99   CO2 22 - 32 mmol/L 20  21  23    Calcium 8.9 - 10.3 mg/dL 41.3  9.9  9.7   Total Protein 6.5 - 8.1 g/dL 7.5     Total Bilirubin 0.3 - 1.2 mg/dL 0.8     Alkaline Phos 38 - 126 U/L 44     AST 15 - 41 U/L 21     ALT 0 - 44 U/L 21       No results found for: "CHOL", "HDL", "LDLCALC", "LDLDIRECT", "TRIG", "CHOLHDL" No results for input(s): "LIPOA" in the last 8760 hours. No components found for: "NTPROBNP" No results for input(s): "PROBNP" in the last 8760 hours. No results for input(s): "TSH" in the last 8760 hours.  Physical Exam:    Today's Vitals   09/16/23 0839 09/16/23 0844  BP: (!) 160/80 130/74  Pulse: (!) 58   Resp: 16   SpO2: 98%   Weight: 124 lb (56.2 kg)   Height: 5\' 1"  (1.549 m)    Body mass index is 23.43 kg/m. Wt Readings from Last 3 Encounters:  09/16/23 124 lb (56.2 kg)  06/02/23 123 lb 6.4 oz (56 kg)  03/07/23 118 lb (53.5 kg)    Physical Exam  Constitutional: No distress.  hemodynamically stable  Neck: No JVD present.  Cardiovascular: Regular rhythm, S1 normal and S2 normal. Bradycardia present. Exam reveals no gallop, no S3 and no S4.  No murmur heard. Pulmonary/Chest: Effort normal and breath sounds normal. No stridor. She has no wheezes. She has no rales.  Musculoskeletal:         General: No edema.     Cervical back: Neck supple.  Skin: Skin is warm.     Impression & Recommendation(s):  Impression:   ICD-10-CM   1. Paroxysmal atrial fibrillation (HCC)  I48.0 EKG 12-Lead    Basic metabolic panel    Hemoglobin and hematocrit, blood    Hemoglobin and  hematocrit, blood    Basic metabolic panel    2. Long term current use of antiarrhythmic drug  Z79.899     3. Long term (current) use of anticoagulants  Z79.01 Hemoglobin and hematocrit, blood    Hemoglobin and hematocrit, blood    4. Benign hypertension  I10     5. Mixed hyperlipidemia  E78.2     6. OSA on CPAP  G47.33        Recommendation(s):  Paroxysmal atrial fibrillation (HCC) Rate control: Toprol-XL. Rhythm control: Flecainide. Thromboembolic prophylaxis: Eliquis. Initial cardioversion in February 2022 and soon thereafter had ER AF.  Second cardioversion in January 2023. Flecainide dose was reduced to 50 mg p.o. twice daily in the past due to EKG changes at higher dose.  Patient is aware to hold flecainide if she develops acute kidney injury to avoid the risk of developing flecainide toxicity  Long term current use of antiarrhythmic drug Indication: Paroxysmal atrial fibrillation Continue flecainide 50 mg p.o. twice daily. Check BMP to evaluate renal function  Long term (current) use of anticoagulants Currently on Eliquis 2.5 mg p.o. twice daily Does not endorse evidence of bleeding. Will check H&H  Benign hypertension Home blood pressures are very well-controlled. Repeat blood pressure today was also well-controlled. Continue amlodipine 2.5 mg p.o. daily. Continue losartan 100 mg p.o. daily. Continue Toprol-XL 25 mg p.o. daily Known history of hyponatremia-if this becomes a chronic issue and asked her to discuss management with PCP  Mixed hyperlipidemia Currently on atorvastatin 10 mg p.o. daily. Her last lipid profile available as from August 2024 at that time the LDL is 66  mg/dL  OSA on CPAP Reemphasized importance of device compliance.  Orders Placed:  Orders Placed This Encounter  Procedures   Basic metabolic panel    Standing Status:   Future    Number of Occurrences:   1    Expected Date:   09/16/2023    Expiration Date:   09/15/2024   Hemoglobin and hematocrit, blood    Standing Status:   Future    Number of Occurrences:   1    Expected Date:   09/16/2023    Expiration Date:   09/15/2024   EKG 12-Lead    Final Medication List:   No orders of the defined types were placed in this encounter.   There are no discontinued medications.   Current Outpatient Medications:    acetaminophen (TYLENOL) 500 MG tablet, Take 500-1,000 mg by mouth every 6 (six) hours as needed (pain)., Disp: , Rfl:    ALPRAZolam (XANAX) 0.25 MG tablet, Take 0.25 mg by mouth See admin instructions. Take 1 tablet (0.25 mg) by mouth scheduled at night for sleep; may take an additional dose during the day if needed for anxiety, Disp: , Rfl:    amLODipine (NORVASC) 2.5 MG tablet, TAKE 1 TABLET BY MOUTH EVERY DAY, Disp: 90 tablet, Rfl: 2   anastrozole (ARIMIDEX) 1 MG tablet, Take 1 tablet (1 mg total) by mouth daily., Disp: 90 tablet, Rfl: 3   atorvastatin (LIPITOR) 10 MG tablet, Take 10 mg by mouth in the morning., Disp: , Rfl:    Calcium Carb-Cholecalciferol (CALCIUM 600 + D PO), Take 1 tablet by mouth 2 (two) times daily., Disp: , Rfl:    cholecalciferol (VITAMIN D3) 25 MCG (1000 UT) tablet, Take 1,000 Units by mouth in the morning., Disp: , Rfl:    ELIQUIS 2.5 MG TABS tablet, TAKE 1 TABLET BY MOUTH TWICE A DAY, Disp: 180 tablet, Rfl:  1   flecainide (TAMBOCOR) 50 MG tablet, TAKE 1 TABLET BY MOUTH TWICE A DAY, Disp: 180 tablet, Rfl: 1   losartan (COZAAR) 100 MG tablet, Take 1 tablet (100 mg total) by mouth daily., Disp: 90 tablet, Rfl: 3   meloxicam (MOBIC) 7.5 MG tablet, Take 7.5 mg by mouth as needed., Disp: , Rfl:    methocarbamol (ROBAXIN) 500 MG tablet, Take 250 mg by mouth at  bedtime. Leg cramps, Disp: , Rfl:    metoprolol succinate (TOPROL-XL) 25 MG 24 hr tablet, Take 1 tablet (25 mg total) by mouth daily., Disp: 90 tablet, Rfl: 2   nitroGLYCERIN (NITROSTAT) 0.4 MG SL tablet, DISSOLVE 1 TABLET UNDER THE TONGUE EVERY 5 MINUTES AS NEEDED FOR CHEST PAIN. IF YOU REQUIRE MORE THAN TWO TABLETS FIVE MINUTES APART GO TO THE NEAREST ER VIA EMS. (Patient taking differently: Place 0.4 mg under the tongue every 5 (five) minutes x 3 doses as needed for chest pain.), Disp: 100 tablet, Rfl: 1   omeprazole (PRILOSEC) 20 MG capsule, Take 20 mg by mouth daily., Disp: , Rfl:    ondansetron (ZOFRAN-ODT) 4 MG disintegrating tablet, Take 1 tablet (4 mg total) by mouth every 8 (eight) hours as needed for nausea or vomiting. (Patient taking differently: Take 4 mg by mouth as needed for nausea or vomiting.), Disp: 20 tablet, Rfl: 0  Consent:   NA  Disposition:   6 months follow-up sooner if needed for  Her questions and concerns were addressed to her satisfaction. She voices understanding of the recommendations provided during this encounter.    Signed, Tessa Lerner, DO, [facility]  [facility]  [address] [place], [place] [address] 09/16/2023 9:19 AM

## 2023-09-17 LAB — BASIC METABOLIC PANEL
BUN/Creatinine Ratio: 20 (ref 12–28)
BUN: 16 mg/dL (ref 8–27)
CO2: 23 mmol/L (ref 20–29)
Calcium: 10.3 mg/dL (ref 8.7–10.3)
Chloride: 97 mmol/L (ref 96–106)
Creatinine, Ser: 0.79 mg/dL (ref 0.57–1.00)
Glucose: 97 mg/dL (ref 70–99)
Potassium: 4.6 mmol/L (ref 3.5–5.2)
Sodium: 136 mmol/L (ref 134–144)
eGFR: 75 mL/min/{1.73_m2} (ref >59)

## 2023-09-17 LAB — HEMOGLOBIN AND HEMATOCRIT, BLOOD
Hematocrit: 42.7 % (ref 34.0–46.6)
Hemoglobin: 14.7 g/dL (ref 11.1–15.9)

## 2023-09-18 ENCOUNTER — Encounter: Payer: Self-pay | Admitting: Cardiology

## 2023-09-19 ENCOUNTER — Other Ambulatory Visit: Payer: Self-pay | Admitting: Cardiology

## 2023-09-19 DIAGNOSIS — I10 Essential (primary) hypertension: Secondary | ICD-10-CM

## 2023-09-26 ENCOUNTER — Inpatient Hospital Stay: Payer: PPO | Attending: Hematology and Oncology | Admitting: Hematology and Oncology

## 2023-09-26 VITALS — BP 138/80 | HR 66 | Temp 97.9°F | Resp 18 | Ht 61.0 in | Wt 124.8 lb

## 2023-09-26 DIAGNOSIS — Z1721 Progesterone receptor positive status: Secondary | ICD-10-CM | POA: Diagnosis not present

## 2023-09-26 DIAGNOSIS — Z923 Personal history of irradiation: Secondary | ICD-10-CM | POA: Insufficient documentation

## 2023-09-26 DIAGNOSIS — Z170 Estrogen receptor positive status [ER+]: Secondary | ICD-10-CM | POA: Diagnosis not present

## 2023-09-26 DIAGNOSIS — C50211 Malignant neoplasm of upper-inner quadrant of right female breast: Secondary | ICD-10-CM | POA: Diagnosis not present

## 2023-09-26 DIAGNOSIS — Z79811 Long term (current) use of aromatase inhibitors: Secondary | ICD-10-CM | POA: Insufficient documentation

## 2023-09-26 DIAGNOSIS — Z1732 Human epidermal growth factor receptor 2 negative status: Secondary | ICD-10-CM | POA: Diagnosis not present

## 2023-09-26 NOTE — Assessment & Plan Note (Signed)
 07/17/2018:Screening detected right breast mass at 1 o'clock position 1.1 cm, no axillary lymph nodes, biopsy revealed IDC grade 1-2, ER 100%, PR 80%, Ki-67 5%, HER-2 negative, 2+ by IHC and ratio 1.22 by FISH, T1CN0 stage Ia   08/03/2018:Right lumpectomy: IDC 0.6 cm, grade 2, margins negative, 0/4 lymph nodes negative, ER 100%, PR 80%, HER-2 equivocal by IHC, FISH negative ratio 1.95, Ki-67 5%, T1BN0 stage Ia   Bone density 08/29/2018:T score -2: Osteopenia I ordered a new bone density test to be done in the next 2 to 3 months.   Treatment plan: Adjuvant antiestrogen therapy with anastrozole 1 mg daily since March 2020. She will complete 5 years of therapy by March 2025. Anastrozole toxicities: none   Breast cancer surveillance: 08/15/2023: Mammogram: Benign breast density category C 09/26/2023 physical examination of breasts: Benign Bone density 04/27/2023: T-score -2.6: Osteoporosis: Recommended calcium vitamin D and bisphosphonate therapy   Return to clinic on an as-needed basis.

## 2023-09-26 NOTE — Progress Notes (Signed)
 Follow-up on her last  Patient Care Team: Garlan Fillers, MD as PCP - General (Internal Medicine) Tessa Lerner, DO as PCP - Cardiology (Cardiology) Serena Croissant, MD as Consulting Physician (Hematology and Oncology) Lonie Peak, MD as Attending Physician (Radiation Oncology) Manus Rudd, MD as Consulting Physician (General Surgery)  DIAGNOSIS:  Encounter Diagnosis  Name Primary?   Carcinoma of upper-inner quadrant of right breast in female, estrogen receptor positive (HCC) Yes    SUMMARY OF ONCOLOGIC HISTORY: Oncology History  Carcinoma of upper-inner quadrant of right breast in female, estrogen receptor positive (HCC)  07/31/2018 Initial Diagnosis   Screening detected right breast mass at 1 o'clock position 1.1 cm, no axillary lymph nodes, biopsy revealed IDC grade 1-2, ER 100%, PR 80%, Ki-67 5%, HER-2 negative, 2+ by IHC and ratio 1.22 by FISH, T1CN0 stage Ia   07/31/2018 Cancer Staging   Staging form: Breast, AJCC 8th Edition - Clinical: Stage IA (cT1c, cN0, cM0, G1, ER+, PR+, HER2-) - Signed by Lonie Peak, MD on 07/31/2018   08/03/2018 Surgery   Right lumpectomy: IDC 0.6 cm, grade 2, margins negative, 0/4 lymph nodes negative, ER 100%, PR 80%, HER-2 equivocal by IHC, FISH negative ratio 1.95, Ki-67 5%, T1BN0 stage Ia   09/15/2018 - 10/10/2018 Radiation Therapy   Adjuvant radiation with Dr. Basilio Cairo   10/10/2018 -  Anti-estrogen oral therapy   Anastrozole, 1mg  daily, plan for 5 years     CHIEF COMPLIANT: Follow-up on anastrozole after completing 5 years of antiestrogen therapy  HISTORY OF PRESENT ILLNESS:  History of Present Illness The patient, with a history of atrial fibrillation and breast cancer, presents for a follow-up visit after completing five years of Anastrozole therapy. She reports overall good health over the past year, with a couple of viral illnesses but no recurrence of atrial fibrillation. She has recently been diagnosed with osteoporosis on a DEXA  scan, a progression from her previous diagnosis of osteopenia. She has not yet discussed this with her primary care physician but has an upcoming appointment. She has previously been recommended to start a medication like Prolia for the osteopenia but declined due to concerns about side effects, particularly potential issues with jaw healing. She has a history of taking Fosamax for bone health.     ALLERGIES:  is allergic to sulfa antibiotics.  MEDICATIONS:  Current Outpatient Medications  Medication Sig Dispense Refill   amLODipine (NORVASC) 2.5 MG tablet TAKE 1 TABLET BY MOUTH EVERY DAY 90 tablet 2   atorvastatin (LIPITOR) 10 MG tablet Take 10 mg by mouth in the morning.     Calcium Carb-Cholecalciferol (CALCIUM 600 + D PO) Take 1 tablet by mouth 2 (two) times daily.     cholecalciferol (VITAMIN D3) 25 MCG (1000 UT) tablet Take 1,000 Units by mouth in the morning.     ELIQUIS 2.5 MG TABS tablet TAKE 1 TABLET BY MOUTH TWICE A DAY 180 tablet 1   flecainide (TAMBOCOR) 50 MG tablet TAKE 1 TABLET BY MOUTH TWICE A DAY 180 tablet 1   losartan (COZAAR) 100 MG tablet TAKE 1 TABLET BY MOUTH EVERY DAY 90 tablet 3   methocarbamol (ROBAXIN) 500 MG tablet Take 250 mg by mouth at bedtime. Leg cramps     metoprolol succinate (TOPROL-XL) 25 MG 24 hr tablet Take 1 tablet (25 mg total) by mouth daily. 90 tablet 2   omeprazole (PRILOSEC) 20 MG capsule Take 20 mg by mouth daily.     ondansetron (ZOFRAN-ODT) 4 MG disintegrating tablet Take 1  tablet (4 mg total) by mouth every 8 (eight) hours as needed for nausea or vomiting. (Patient taking differently: Take 4 mg by mouth as needed for nausea or vomiting.) 20 tablet 0   acetaminophen (TYLENOL) 500 MG tablet Take 500-1,000 mg by mouth every 6 (six) hours as needed (pain). (Patient not taking: Reported on 09/26/2023)     ALPRAZolam (XANAX) 0.25 MG tablet Take 0.25 mg by mouth See admin instructions. Take 1 tablet (0.25 mg) by mouth scheduled at night for sleep; may  take an additional dose during the day if needed for anxiety (Patient not taking: Reported on 09/26/2023)     nitroGLYCERIN (NITROSTAT) 0.4 MG SL tablet DISSOLVE 1 TABLET UNDER THE TONGUE EVERY 5 MINUTES AS NEEDED FOR CHEST PAIN. IF YOU REQUIRE MORE THAN TWO TABLETS FIVE MINUTES APART GO TO THE NEAREST ER VIA EMS. (Patient not taking: Reported on 09/26/2023) 100 tablet 1   No current facility-administered medications for this visit.    PHYSICAL EXAMINATION: ECOG PERFORMANCE STATUS: 1 - Symptomatic but completely ambulatory  Vitals:   09/26/23 1027  BP: 138/80  Pulse: 66  Resp: 18  Temp: 97.9 F (36.6 C)  SpO2: 100%   Filed Weights   09/26/23 1027  Weight: 124 lb 12.8 oz (56.6 kg)      LABORATORY DATA:  I have reviewed the data as listed    Latest Ref Rng & Units 09/16/2023    9:25 AM 09/30/2022    4:15 PM 08/12/2022   10:52 AM  CMP  Glucose 70 - 99 mg/dL 97  161  96   BUN 8 - 27 mg/dL 16  30  23    Creatinine 0.57 - 1.00 mg/dL 0.96  0.45  4.09   Sodium 134 - 144 mmol/L 136  132  133   Potassium 3.5 - 5.2 mmol/L 4.6  4.1  4.9   Chloride 96 - 106 mmol/L 97  98  97   CO2 20 - 29 mmol/L 23  20  21    Calcium 8.7 - 10.3 mg/dL 81.1  91.4  9.9   Total Protein 6.5 - 8.1 g/dL  7.5    Total Bilirubin 0.3 - 1.2 mg/dL  0.8    Alkaline Phos 38 - 126 U/L  44    AST 15 - 41 U/L  21    ALT 0 - 44 U/L  21      Lab Results  Component Value Date   WBC 10.0 09/30/2022   HGB 14.7 09/16/2023   HCT 42.7 09/16/2023   MCV 89.0 09/30/2022   PLT 250 09/30/2022    ASSESSMENT & PLAN:  Carcinoma of upper-inner quadrant of right breast in female, estrogen receptor positive (HCC) 07/17/2018:Screening detected right breast mass at 1 o'clock position 1.1 cm, no axillary lymph nodes, biopsy revealed IDC grade 1-2, ER 100%, PR 80%, Ki-67 5%, HER-2 negative, 2+ by IHC and ratio 1.22 by FISH, T1CN0 stage Ia   08/03/2018:Right lumpectomy: IDC 0.6 cm, grade 2, margins negative, 0/4 lymph nodes negative,  ER 100%, PR 80%, HER-2 equivocal by IHC, FISH negative ratio 1.95, Ki-67 5%, T1BN0 stage Ia   Treatment plan: Adjuvant antiestrogen therapy with anastrozole 1 mg daily since March 2020. She will complete 5 years of therapy by March 2025. Anastrozole toxicities: none   Breast cancer surveillance: 08/15/2023: Mammogram: Benign breast density category C 09/26/2023 physical examination of breasts: Benign  Bone density 08/29/2018:T score -2: Osteopenia Bone density 04/27/2023: T-score -2.6: Osteoporosis: Recommended calcium vitamin D and  bisphosphonate therapy   Return to clinic on an as-needed basis. ------------------------------------- Assessment and Plan Assessment & Plan Breast Cancer Completed 5 years of Anastrozole therapy. No current issues reported. Recent mammogram showed no abnormalities. -Discontinue Anastrozole. -Continue annual mammograms.  Osteoporosis Recent DEXA scan showed progression to osteoporosis. Patient has a history of acid reflux and takes Omeprazole. Discussed various treatment options including Fosamax, Prolia, Zemaira, and Boniva. -Discuss treatment options with primary care physician.   Atrial Fibrillation Patient reports maintaining normal sinus rhythm. No changes or concerns reported. -Continue current management.  General Health Maintenance / Followup Plans -Annual follow-up visits with this office will be on an as-needed basis.      No orders of the defined types were placed in this encounter.  The patient has a good understanding of the overall plan. she agrees with it. she will call with any problems that may develop before the next visit here. Total time spent: 30 mins including face to face time and time spent for planning, charting and co-ordination of care   Tamsen Meek, MD 09/26/23

## 2023-10-24 DIAGNOSIS — I10 Essential (primary) hypertension: Secondary | ICD-10-CM | POA: Diagnosis not present

## 2023-10-24 DIAGNOSIS — E871 Hypo-osmolality and hyponatremia: Secondary | ICD-10-CM | POA: Diagnosis not present

## 2023-10-24 DIAGNOSIS — M810 Age-related osteoporosis without current pathological fracture: Secondary | ICD-10-CM | POA: Diagnosis not present

## 2023-10-24 DIAGNOSIS — F419 Anxiety disorder, unspecified: Secondary | ICD-10-CM | POA: Diagnosis not present

## 2023-10-24 DIAGNOSIS — D6869 Other thrombophilia: Secondary | ICD-10-CM | POA: Diagnosis not present

## 2023-10-24 DIAGNOSIS — I4819 Other persistent atrial fibrillation: Secondary | ICD-10-CM | POA: Diagnosis not present

## 2023-12-18 ENCOUNTER — Other Ambulatory Visit: Payer: Self-pay | Admitting: Cardiology

## 2023-12-18 DIAGNOSIS — I480 Paroxysmal atrial fibrillation: Secondary | ICD-10-CM

## 2023-12-19 DIAGNOSIS — G4733 Obstructive sleep apnea (adult) (pediatric): Secondary | ICD-10-CM | POA: Diagnosis not present

## 2023-12-30 ENCOUNTER — Telehealth: Payer: Self-pay | Admitting: Cardiology

## 2023-12-30 NOTE — Telephone Encounter (Signed)
 Spoke with pt who complains of Afib per [facility] since 6/9.  Pt denies current CP, SOB or dizziness.  Does complain of increased fatigue.  Pt is taking Flecainide  and Eliquis  as prescribed.   Appointment scheduled with Dr Katheryne Pane, DOD for 01/02/2024 to further evaluate.  Reviewed ED precautions.  Pt verbalizes understanding and agrees with current plan.

## 2023-12-30 NOTE — Telephone Encounter (Signed)
 Patient c/o Palpitations:  STAT if patient reporting lightheadedness, shortness of breath, or chest pain  How long have you had palpitations/irregular HR/ Afib? Are you having the symptoms now? Afib per her [facility] since Monday 12/26/23    Are you currently experiencing lightheadedness, SOB or CP? No   Do you have a history of afib (atrial fibrillation) or irregular heart rhythm? Yes   Have you checked your BP or HR? (document readings if available):   130/74 hr 63 - today  133/84 hr 71 - 6/9 164/76 hr 56 - 6/5   Are you experiencing any other symptoms? No

## 2024-01-02 ENCOUNTER — Encounter: Payer: Self-pay | Admitting: Cardiovascular Disease

## 2024-01-02 ENCOUNTER — Ambulatory Visit: Attending: Cardiovascular Disease | Admitting: Cardiovascular Disease

## 2024-01-02 VITALS — BP 172/100 | HR 58 | Ht 61.0 in | Wt 115.0 lb

## 2024-01-02 DIAGNOSIS — I480 Paroxysmal atrial fibrillation: Secondary | ICD-10-CM | POA: Diagnosis not present

## 2024-01-02 DIAGNOSIS — I4819 Other persistent atrial fibrillation: Secondary | ICD-10-CM

## 2024-01-02 NOTE — Assessment & Plan Note (Signed)
 History of PAF status post cardioversion x 2 last 1 of which was January 2023.  She is on Eliquis  oral anticoagulation and flecainide .  She noticed that she was in A-fib a week ago only because of her Kardia EKG monitor.  She is for the most part asymptomatic.  I am going to refer her to EP to discuss the possibility of A-fib ablation.  A rate control strategy would also be a reasonable way to go.

## 2024-01-02 NOTE — Patient Instructions (Addendum)
 Medication Instructions:  Your physician recommends that you continue on your current medications as directed. Please refer to the Current Medication list given to you today.  *If you need a refill on your cardiac medications before your next appointment, please call your pharmacy*   Follow-Up: At [facility], you and your health needs are our priority.  As part of our continuing mission to provide you with exceptional heart care, our providers are all part of one team.  This team includes your primary Cardiologist (physician) and Advanced Practice Providers or APPs (Physician Assistants and Nurse Practitioners) who all work together to provide you with the care you need, when you need it.  Your next appointment:   Wednesday, August 27th @ 10am  Provider:   Olinda Bertrand, DO    We recommend signing up for the patient portal called MyChart.  Sign up information is provided on this After Visit Summary.  MyChart is used to connect with patients for Virtual Visits (Telemedicine).  Patients are able to view lab/test results, encounter notes, upcoming appointments, etc.  Non-urgent messages can be sent to your provider as well.   To learn more about what you can do with MyChart, go to [email or web address].

## 2024-01-02 NOTE — Progress Notes (Signed)
 01/02/2024 Kelly Blackwell   Mar 31, 1941  [id number]  Primary Physician Bertha Broad, MD Primary Cardiologist: Avanell Leigh MD Kelly Blackwell, [place]  HPI:  Kelly Blackwell is a 83 y.o. thin-appearing married Caucasian female mother of 3, grandmother of 6 grandchildren who is a patient of Dr. Ethel Henry.  She is being seen as an add-on today because of recurrent atrial fibrillation.  She is a retired Charity fundraiser at [employer] and after that was had an infection control for employee health.  She does have treated hypertension hyperlipidemia.  She had A-fib with initial cardioversion February 2022 and her last cardioversion January 2023.  She is on Eliquis  and flecainide .  She noted that she was in A-fib a week ago only because she used her Kardia EKG monitor but otherwise is asymptomatic.   Current Meds  Medication Sig   acetaminophen  (TYLENOL ) 500 MG tablet Take 500-1,000 mg by mouth every 6 (six) hours as needed (pain).   ALPRAZolam (XANAX) 0.25 MG tablet Take 0.25 mg by mouth See admin instructions. Take 1 tablet (0.25 mg) by mouth scheduled at night for sleep; may take an additional dose during the day if needed for anxiety   amLODipine  (NORVASC ) 2.5 MG tablet TAKE 1 TABLET BY MOUTH EVERY DAY   atorvastatin (LIPITOR) 10 MG tablet Take 10 mg by mouth in the morning.   Calcium Carb-Cholecalciferol (CALCIUM 600 + D PO) Take 1 tablet by mouth 2 (two) times daily.   cholecalciferol (VITAMIN D3) 25 MCG (1000 UT) tablet Take 1,000 Units by mouth in the morning.   ELIQUIS  2.5 MG TABS tablet TAKE 1 TABLET BY MOUTH TWICE A DAY   flecainide  (TAMBOCOR ) 50 MG tablet TAKE 1 TABLET BY MOUTH TWICE A DAY   losartan  (COZAAR ) 100 MG tablet TAKE 1 TABLET BY MOUTH EVERY DAY   methocarbamol (ROBAXIN) 500 MG tablet Take 250 mg by mouth at bedtime. Leg cramps   metoprolol  succinate (TOPROL -XL) 25 MG 24 hr tablet TAKE 1 TABLET (25 MG TOTAL) BY MOUTH DAILY.   nitroGLYCERIN  (NITROSTAT ) 0.4 MG SL tablet  DISSOLVE 1 TABLET UNDER THE TONGUE EVERY 5 MINUTES AS NEEDED FOR CHEST PAIN. IF YOU REQUIRE MORE THAN TWO TABLETS FIVE MINUTES APART GO TO THE NEAREST ER VIA EMS.   omeprazole (PRILOSEC) 20 MG capsule Take 20 mg by mouth daily.   ondansetron  (ZOFRAN -ODT) 4 MG disintegrating tablet Take 1 tablet (4 mg total) by mouth every 8 (eight) hours as needed for nausea or vomiting. (Patient taking differently: Take 4 mg by mouth as needed for nausea or vomiting.)     Allergies  Allergen Reactions   Sulfa Antibiotics Rash and Other (See Comments)    Fever and rash     Social History   Socioeconomic History   Marital status: Married    Spouse name: Not on file   Number of children: 3   Years of education: Not on file   Highest education level: Not on file  Occupational History   Not on file  Tobacco Use   Smoking status: Never   Smokeless tobacco: Never  Vaping Use   Vaping status: Never Used  Substance and Sexual Activity   Alcohol  use: Yes    Comment: occasionally glass if wine   Drug use: Never   Sexual activity: Not on file  Other Topics Concern   Not on file  Social History Narrative   6 grandchildren   Social Drivers of Dispensing optician  Resource Strain: Not on file  Food Insecurity: Not on file  Transportation Needs: No Transportation Needs (07/31/2018)   PRAPARE - Administrator, Civil Service (Medical): No    Lack of Transportation (Non-Medical): No  Physical Activity: Not on file  Stress: Not on file  Social Connections: Not on file  Intimate Partner Violence: Not At Risk (07/31/2018)   Humiliation, Afraid, Rape, and Kick questionnaire    Fear of Current or Ex-Partner: No    Emotionally Abused: No    Physically Abused: No    Sexually Abused: No     Review of Systems: General: negative for chills, fever, night sweats or weight changes.  Cardiovascular: negative for chest pain, dyspnea on exertion, edema, orthopnea, palpitations, paroxysmal nocturnal  dyspnea or shortness of breath Dermatological: negative for rash Respiratory: negative for cough or wheezing Urologic: negative for hematuria Abdominal: negative for nausea, vomiting, diarrhea, bright red blood per rectum, melena, or hematemesis Neurologic: negative for visual changes, syncope, or dizziness All other systems reviewed and are otherwise negative except as noted above.    Blood pressure (!) 172/100, pulse (!) 58, height 5' 1 (1.549 m), weight 115 lb (52.2 kg), SpO2 97%.  General appearance: alert and no distress Neck: no adenopathy, no carotid bruit, no JVD, supple, symmetrical, trachea midline, and thyroid  not enlarged, symmetric, no tenderness/mass/nodules Lungs: clear to auscultation bilaterally Heart: irregularly irregular rhythm Extremities: extremities normal, atraumatic, no cyanosis or edema Pulses: 2+ and symmetric Skin: Skin color, texture, turgor normal. No rashes or lesions Neurologic: Grossly normal  EKG EKG Interpretation Date/Time:  Monday January 02 2024 14:51:23 EDT Ventricular Rate:  58 PR Interval:    QRS Duration:  94 QT Interval:  402 QTC Calculation: 394 R Axis:   19  Text Interpretation: Atrial fibrillation with slow ventricular response When compared with ECG of 16-Sep-2023 08:42, Atrial fibrillation has replaced Sinus rhythm Nonspecific T wave abnormality now evident in Inferior leads Confirmed by Lauro Portal [id number]) on 01/02/2024 3:12:15 PM    ASSESSMENT AND PLAN:   Persistent atrial fibrillation (HCC) History of PAF status post cardioversion x 2 last 1 of which was January 2023.  She is on Eliquis  oral anticoagulation and flecainide .  She noticed that she was in A-fib a week ago only because of her Kardia EKG monitor.  She is for the most part asymptomatic.  I am going to refer her to EP to discuss the possibility of A-fib ablation.  A rate control strategy would also be a reasonable way to go.     Avanell Leigh MD FACP,FACC,FAHA,  [facility] 01/02/2024 3:23 PM

## 2024-01-09 NOTE — Progress Notes (Signed)
 " Electrophysiology Office Note:   Date:  01/10/2024  ID:  Kelly Blackwell, DOB [birth year], MRN [id number]  Primary Cardiologist: Madonna Large, DO Electrophysiologist: OLE ONEIDA HOLTS, MD      History of Present Illness:   Kelly Blackwell is a 83 y.o. female with h/o breast cancer, OSA, HTN, HLD, persistent atrial fibrillation who is being seen today for evaluation for catheter ablation at the request of Dr. Court.  Discussed the use of AI scribe software for clinical note transcription with the patient, who gave verbal consent to proceed.  History of Present Illness Kelly Blackwell is an 83 year old female with atrial fibrillation who presents for evaluation of her condition. She was first diagnosed with atrial fibrillation in November 2021 and has been in persistent atrial fibrillation since then. She has undergone cardioversion twice; the first was successful for a short period, but she soon reverted to atrial fibrillation. The second cardioversion was more successful, maintaining normal rhythm for two years. Her primary symptom of atrial fibrillation is extreme tiredness. During a trip to [place], she nearly experienced syncope due to exhaustion, which she attributes to her condition. She feels more fatigued when in atrial fibrillation.  Her current medication regimen includes flecainide , which she started between her first and second cardioversion. The medication was effective for a while, allowing her to stay in normal sinus rhythm for two years.  Family history is significant for atrial fibrillation, as her mother also had the condition and passed away at 66 from congestive heart failure. This family history contributes to her concern about her own health trajectory.  She has a good level of physical activity for her age. Otherwise doing well with no new or acute complaints today.    Review of systems complete and found to be negative unless listed in HPI.   EP Information /  Studies Reviewed:    EKG is not ordered today. EKG from 01/02/24 reviewed which showed AF with slow ventricular response.        Exercise Treadmill 12/10/22:  Exercise treadmill stress test 12/13/2022: Exercise treadmill stress test performed using Bruce protocol.  Patient exercised for a total of 5 minutes and 25 seconds, achieving 7.0 METS, and 78% of age predicted maximum heart rate.  Exercise capacity was fair.  No chest pain reported.  Dyspnea reported. Normal heart rate and hemodynamic response. Stress EKG revealed no ischemic changes. Low risk study.  Echo 11/21/19: Normal LV systolic function with visual EF 60-65%. Left ventricle cavity  is normal in size. Mild left ventricular hypertrophy. Normal global wall  motion. Indeterminate diastolic filling pattern, elevated LAP. Calculated  EF 57%.  Mild (Grade I) mitral regurgitation.  Mild tricuspid regurgitation.  No prior study for comparison.   Risk Assessment/Calculations:    CHA2DS2-VASc Score = 4   This indicates a 4.8% annual risk of stroke. The patient's score is based upon: CHF History: 0 HTN History: 1 Diabetes History: 0 Stroke History: 0 Vascular Disease History: 0 Age Score: 2 Gender Score: 1             Physical Exam:   VS:  BP 124/80   Pulse 69   Ht 5' 1 (1.549 m)   Wt 123 lb (55.8 kg)   SpO2 97%   BMI 23.24 kg/m    Wt Readings from Last 3 Encounters:  01/10/24 123 lb (55.8 kg)  01/02/24 115 lb (52.2 kg)  09/26/23 124 lb 12.8 oz (56.6 kg)  GEN: Well nourished, well developed in no acute distress NECK: No JVD CARDIAC: Bradycardic, irregular RESPIRATORY:  Clear to auscultation without rales, wheezing or rhonchi  ABDOMEN: Soft, non-distended EXTREMITIES:  No edema; No deformity   ASSESSMENT AND PLAN:    #. Persistent atrial fibrillation, symptomatic: Continues to have AF despite anti-arrhythmic therapy. She reports significant fatigue associated with AF. Additionally, she is worried about  long term risks associated with progression to permanent AF, such as stroke, dementia and HF.  #. Secondary hypercoagulable state due to AF #. High risk medication use: Flecainide . QRS normal. Unable to assess PR d/t AF. Stress test normal 11/2022.  -Discussed treatment options today for AF including antiarrhythmic drug therapy and ablation. Discussed risks, recovery and likelihood of success with each treatment strategy. Risk, benefits, and alternatives to EP study and ablation for afib were discussed. These risks include but are not limited to stroke, bleeding, vascular damage, tamponade, perforation, damage to the esophagus, lungs, phrenic nerve and other structures, pulmonary vein stenosis, worsening renal function, coronary vasospasm and death.  Discussed potential need for repeat ablation procedures and antiarrhythmic drugs after an initial ablation. The patient understands these risk and wishes to proceed.  We will therefore proceed with catheter ablation at the next available time.  Carto, ICE, anesthesia are requested for the procedure.  Will also obtain CT PV protocol prior to the procedure to exclude LAA thrombus and further evaluate atrial anatomy. -Obtain an updated echocardiogram.  -Continue flecainide  50mg  BID.  -Continue metoprololr XL 25mg  daily.  -Continue Eliquis  2.5mg  BID.    #Hypertension -At goal today.  Recommend checking blood pressures 1-2 times per week at home and recording the values.  Recommend bringing these recordings to the primary care physician  Follow up with Dr. Kennyth 3 months after ablation.   Signed, Fonda Kennyth, MD  "

## 2024-01-10 ENCOUNTER — Ambulatory Visit: Attending: Cardiology | Admitting: Cardiology

## 2024-01-10 ENCOUNTER — Encounter: Payer: Self-pay | Admitting: Cardiology

## 2024-01-10 ENCOUNTER — Other Ambulatory Visit: Payer: Self-pay

## 2024-01-10 VITALS — BP 124/80 | HR 69 | Ht 61.0 in | Wt 123.0 lb

## 2024-01-10 DIAGNOSIS — D6869 Other thrombophilia: Secondary | ICD-10-CM

## 2024-01-10 DIAGNOSIS — Z79899 Other long term (current) drug therapy: Secondary | ICD-10-CM | POA: Diagnosis not present

## 2024-01-10 DIAGNOSIS — I4819 Other persistent atrial fibrillation: Secondary | ICD-10-CM | POA: Diagnosis not present

## 2024-01-10 DIAGNOSIS — I10 Essential (primary) hypertension: Secondary | ICD-10-CM

## 2024-01-10 DIAGNOSIS — I480 Paroxysmal atrial fibrillation: Secondary | ICD-10-CM

## 2024-01-10 NOTE — Patient Instructions (Addendum)
 Medication Instructions:  Your physician recommends that you continue on your current medications as directed. Please refer to the Current Medication list given to you today.  *If you need a refill on your cardiac medications before your next appointment, please call your pharmacy*  Lab Work: BMET and CBC - you may go to any [facility] location to have these drawn within 30 days of your procedure (after August 25th). There is a [facility] located on the 1st floor of the [facility].   Testing/Procedures: Echocardiogram  Your physician has requested that you have an echocardiogram. Echocardiography is a painless test that uses sound waves to create images of your heart. It provides your doctor with information about the size and shape of your heart and how well your heart's chambers and valves are working. This procedure takes approximately one hour. There are no restrictions for this procedure. Please do NOT wear cologne, perfume, aftershave, or lotions (deodorant is allowed). Please arrive 15 minutes prior to your appointment time.   Cardiac CT Your physician has requested that you have cardiac CT. Cardiac computed tomography (CT) is a painless test that uses an x-ray machine to take clear, detailed pictures of your heart. For further information please visit [email or web address]  We will call you to schedule your CT scan. It will be done about three weeks prior to your ablation.  Ablation Your physician has recommended that you have an ablation. Catheter ablation is a medical procedure used to treat some cardiac arrhythmias (irregular heartbeats). During catheter ablation, a long, thin, flexible tube is put into a blood vessel in your groin (upper thigh), or neck. This tube is called an ablation catheter. It is then guided to your heart through the blood vessel. Radio frequency waves destroy small areas of heart tissue where abnormal heartbeats may cause an arrhythmia to start.  You  are scheduled for Atrial Fibrillation Ablation on Wednesday, September 24 with Dr. Sidra Kitty.Please arrive at the Main Entrance A at [facility]: [address] [place], [place] [address] at 9:00 AM    Follow-Up: At [facility], you and your health needs are our priority.  As part of our continuing mission to provide you with exceptional heart care, we have created designated Provider Care Teams.  These Care Teams include your primary Cardiologist (physician) and Advanced Practice Providers (APPs -  Physician Assistants and Nurse Practitioners) who all work together to provide you with the care you need, when you need it.   Your next appointment:   We will contact you about your post-procedure follow up appointments.

## 2024-01-17 ENCOUNTER — Other Ambulatory Visit: Payer: Self-pay | Admitting: Cardiology

## 2024-01-17 DIAGNOSIS — Z79899 Other long term (current) drug therapy: Secondary | ICD-10-CM

## 2024-02-21 DIAGNOSIS — I4819 Other persistent atrial fibrillation: Secondary | ICD-10-CM | POA: Diagnosis not present

## 2024-02-21 DIAGNOSIS — K219 Gastro-esophageal reflux disease without esophagitis: Secondary | ICD-10-CM | POA: Diagnosis not present

## 2024-02-21 DIAGNOSIS — R634 Abnormal weight loss: Secondary | ICD-10-CM | POA: Diagnosis not present

## 2024-02-21 DIAGNOSIS — E871 Hypo-osmolality and hyponatremia: Secondary | ICD-10-CM | POA: Diagnosis not present

## 2024-02-21 DIAGNOSIS — R110 Nausea: Secondary | ICD-10-CM | POA: Diagnosis not present

## 2024-02-21 DIAGNOSIS — R1310 Dysphagia, unspecified: Secondary | ICD-10-CM | POA: Diagnosis not present

## 2024-02-21 DIAGNOSIS — R197 Diarrhea, unspecified: Secondary | ICD-10-CM | POA: Diagnosis not present

## 2024-02-21 DIAGNOSIS — D6869 Other thrombophilia: Secondary | ICD-10-CM | POA: Diagnosis not present

## 2024-02-22 DIAGNOSIS — R197 Diarrhea, unspecified: Secondary | ICD-10-CM | POA: Diagnosis not present

## 2024-02-22 DIAGNOSIS — E871 Hypo-osmolality and hyponatremia: Secondary | ICD-10-CM | POA: Diagnosis not present

## 2024-02-22 DIAGNOSIS — K219 Gastro-esophageal reflux disease without esophagitis: Secondary | ICD-10-CM | POA: Diagnosis not present

## 2024-02-25 DIAGNOSIS — G4733 Obstructive sleep apnea (adult) (pediatric): Secondary | ICD-10-CM | POA: Diagnosis not present

## 2024-02-27 ENCOUNTER — Ambulatory Visit (HOSPITAL_COMMUNITY)
Admission: RE | Admit: 2024-02-27 | Discharge: 2024-02-27 | Disposition: A | Discharge status: Home / Self Care | Source: Ambulatory Visit | Attending: Cardiology | Admitting: Cardiology

## 2024-02-27 DIAGNOSIS — I4819 Other persistent atrial fibrillation: Secondary | ICD-10-CM | POA: Diagnosis not present

## 2024-02-27 LAB — ECHOCARDIOGRAM COMPLETE: S' Lateral: 2.87 cm

## 2024-02-29 ENCOUNTER — Ambulatory Visit: Payer: Self-pay | Admitting: Cardiology

## 2024-03-05 ENCOUNTER — Other Ambulatory Visit: Payer: Self-pay | Admitting: Internal Medicine

## 2024-03-05 DIAGNOSIS — K219 Gastro-esophageal reflux disease without esophagitis: Secondary | ICD-10-CM | POA: Diagnosis not present

## 2024-03-05 DIAGNOSIS — F458 Other somatoform disorders: Secondary | ICD-10-CM | POA: Diagnosis not present

## 2024-03-05 DIAGNOSIS — R09A2 Foreign body sensation, throat: Secondary | ICD-10-CM

## 2024-03-05 DIAGNOSIS — R194 Change in bowel habit: Secondary | ICD-10-CM | POA: Diagnosis not present

## 2024-03-06 ENCOUNTER — Ambulatory Visit
Admission: RE | Admit: 2024-03-06 | Discharge: 2024-03-06 | Disposition: A | Discharge status: Home / Self Care | Source: Ambulatory Visit | Attending: Internal Medicine | Admitting: Internal Medicine

## 2024-03-06 DIAGNOSIS — K219 Gastro-esophageal reflux disease without esophagitis: Secondary | ICD-10-CM | POA: Diagnosis not present

## 2024-03-06 DIAGNOSIS — R09A2 Foreign body sensation, throat: Secondary | ICD-10-CM

## 2024-03-08 DIAGNOSIS — I10 Essential (primary) hypertension: Secondary | ICD-10-CM | POA: Diagnosis not present

## 2024-03-08 DIAGNOSIS — E559 Vitamin D deficiency, unspecified: Secondary | ICD-10-CM | POA: Diagnosis not present

## 2024-03-08 DIAGNOSIS — E7849 Other hyperlipidemia: Secondary | ICD-10-CM | POA: Diagnosis not present

## 2024-03-08 DIAGNOSIS — E785 Hyperlipidemia, unspecified: Secondary | ICD-10-CM | POA: Diagnosis not present

## 2024-03-08 DIAGNOSIS — M810 Age-related osteoporosis without current pathological fracture: Secondary | ICD-10-CM | POA: Diagnosis not present

## 2024-03-12 ENCOUNTER — Telehealth: Payer: Self-pay | Admitting: Cardiology

## 2024-03-12 NOTE — Telephone Encounter (Signed)
 Patient stated she will be going out of town the week before her ablation and wants a call back to discuss what she will need to do prior to the procedure.

## 2024-03-12 NOTE — Telephone Encounter (Signed)
 Called pt and went over detailed instructions of her CT/Ablation.   She will have labs done on 8/27 while here for an o/v with Dr. Michele.. CT is scheduled on 9/3 at 11:00 am... Ablation is 9/24 at 11:00 with Dr. Kennyth..  I will send Instruction letters via MyChart and mail a copy to pt (if time allows)

## 2024-03-14 ENCOUNTER — Encounter: Payer: Self-pay | Admitting: Cardiology

## 2024-03-14 ENCOUNTER — Ambulatory Visit: Attending: Cardiovascular Disease | Admitting: Cardiology

## 2024-03-14 VITALS — BP 118/72 | HR 74 | Resp 16 | Ht 61.0 in | Wt 118.6 lb

## 2024-03-14 DIAGNOSIS — E782 Mixed hyperlipidemia: Secondary | ICD-10-CM

## 2024-03-14 DIAGNOSIS — Z79899 Other long term (current) drug therapy: Secondary | ICD-10-CM

## 2024-03-14 DIAGNOSIS — I4819 Other persistent atrial fibrillation: Secondary | ICD-10-CM | POA: Diagnosis not present

## 2024-03-14 DIAGNOSIS — Z7901 Long term (current) use of anticoagulants: Secondary | ICD-10-CM

## 2024-03-14 DIAGNOSIS — I10 Essential (primary) hypertension: Secondary | ICD-10-CM

## 2024-03-14 DIAGNOSIS — R82998 Other abnormal findings in urine: Secondary | ICD-10-CM | POA: Diagnosis not present

## 2024-03-14 DIAGNOSIS — I480 Paroxysmal atrial fibrillation: Secondary | ICD-10-CM | POA: Diagnosis not present

## 2024-03-14 DIAGNOSIS — G4733 Obstructive sleep apnea (adult) (pediatric): Secondary | ICD-10-CM

## 2024-03-14 LAB — CBC

## 2024-03-14 NOTE — Progress Notes (Signed)
 Cardiology Office Note:  .   Date:  03/14/2024  ID:  Kelly Blackwell, DOB [birth year], MRN [id number] PCP:  Yolande Toribio MATSU, MD  Former Cardiology Providers: Dr. Gordy [facility] Providers Cardiologist:  Madonna Large, DO Electrophysiologist:  OLE ONEIDA HOLTS, MD , [facility] (established care 11/15/2019 ) Electrophysiologist:  OLE ONEIDA HOLTS, MD  Click to update primary MD,subspecialty MD or APP then REFRESH:1}    Chief Complaint  Patient presents with   Atrial Fibrillation   Follow-up    History of Present Illness: .   Kelly Blackwell is a 83 y.o. Caucasian female whose past medical history and cardiovascular risk factors includes: Sleep Apnea on CPAP, persistent atrial fibrillation, hypertension, hyperlipidemia, history of breast cancer, postmenopausal female, advanced age.   Patient is being followed by the practice given her history of persistent atrial fibrillation and benign essential hypertension.  Persistent atrial fibrillation:  In the past flecainide  was reduced to 50 mg p.o. twice daily due to EKG changes on higher dose.  With the change in flecainide  she did undergo a GXT. She does appreciate when she goes in and out of A-fib and also utilizes Kardia mobile to verify her rhythm.    Patient was seen by my partner Dr. Dorn Dames in June 2025 and was referred to EP for consideration for catheter directed ablation.  She was seen by Dr. Kennyth and is scheduled for atrial fibrillation ablation in September 2025.  Patient is overall doing well.  Awaiting her atrial fibrillation ablation in the coming month.  Questions and concerns are addressed to her satisfaction.  Review of Systems: .   Review of Systems  Cardiovascular:  Negative for chest pain, claudication, irregular heartbeat, leg swelling, near-syncope, orthopnea, palpitations, paroxysmal nocturnal dyspnea and syncope.  Respiratory:  Negative for shortness of breath.   Hematologic/Lymphatic: Negative for  bleeding problem.    Studies Reviewed:   EKG: EKG Interpretation Date/Time:  Wednesday March 14 2024 09:07:42 EDT Ventricular Rate:  61 PR Interval:    QRS Duration:  88 QT Interval:  412 QTC Calculation: 414 R Axis:   51  Text Interpretation: Atrial fibrillation CONTROLLED VENTRICULAR RESPONSE Consider Anterior infarct , age undetermined When compared with ECG of 02-Jan-2024 14:51, No significant change since last tracing Confirmed by Large Madonna [phone]) on 03/14/2024 9:17:10 AM  Echocardiogram: 11/21/2019: LVEF 60 to 65%, mild LVH, indeterminate diastolic filling pattern, elevated LAP, mild MR, mild TR.  Stress Testing: Lexiscan  (Walking with mod Bruce)Tetrofosmin Stress Test  11/19/2019:  Myocardial perfusion is normal.  Overall LV systolic function is normal without regional wall motion abnormalities.  Stress LV EF: 61%.  No previous exam available for comparison. Low risk study.    Heart Catheterization: None   Procedures: 09/03/2020: Direct-current cardioversion, 200 J x 1, atrial fibrillation converted to normal sinus rhythm.   July 22, 2021: Direct-current cardioversion, 200 J x 1 converted to NSR.  RADIOLOGY: NA  Risk Assessment/Calculations:   Click Here to Calculate/Change CHADS2VASc Score The patient's CHADS2-VASc score is 4, indicating a 4.8% annual risk of stroke.   CHF History: No HTN History: Yes Diabetes History: No Stroke History: No Vascular Disease History: No  Labs:       Latest Ref Rng & Units 09/16/2023    9:25 AM 09/30/2022    4:15 PM 08/12/2022   10:52 AM  CBC  WBC 4.0 - 10.5 K/uL  10.0    Hemoglobin 11.1 - 15.9 g/dL 85.2  85.0  12.9  Hematocrit 34.0 - 46.6 % 42.7  44.7  37.7   Platelets 150 - 400 K/uL  250         Latest Ref Rng & Units 09/16/2023    9:25 AM 09/30/2022    4:15 PM 08/12/2022   10:52 AM  BMP  Glucose 70 - 99 mg/dL 97  850  96   BUN 8 - 27 mg/dL 16  30  23    Creatinine 0.57 - 1.00 mg/dL 9.20  8.96  8.94    BUN/Creat Ratio 12 - 28 20   22    Sodium 134 - 144 mmol/L 136  132  133   Potassium 3.5 - 5.2 mmol/L 4.6  4.1  4.9   Chloride 96 - 106 mmol/L 97  98  97   CO2 20 - 29 mmol/L 23  20  21    Calcium 8.7 - 10.3 mg/dL 89.6  89.6  9.9       Latest Ref Rng & Units 09/16/2023    9:25 AM 09/30/2022    4:15 PM 08/12/2022   10:52 AM  CMP  Glucose 70 - 99 mg/dL 97  850  96   BUN 8 - 27 mg/dL 16  30  23    Creatinine 0.57 - 1.00 mg/dL 9.20  8.96  8.94   Sodium 134 - 144 mmol/L 136  132  133   Potassium 3.5 - 5.2 mmol/L 4.6  4.1  4.9   Chloride 96 - 106 mmol/L 97  98  97   CO2 20 - 29 mmol/L 23  20  21    Calcium 8.7 - 10.3 mg/dL 89.6  89.6  9.9   Total Protein 6.5 - 8.1 g/dL  7.5    Total Bilirubin 0.3 - 1.2 mg/dL  0.8    Alkaline Phos 38 - 126 U/L  44    AST 15 - 41 U/L  21    ALT 0 - 44 U/L  21      No results found for: CHOL, HDL, LDLCALC, LDLDIRECT, TRIG, CHOLHDL No results for input(s): LIPOA in the last 8760 hours. No components found for: NTPROBNP No results for input(s): PROBNP in the last 8760 hours. No results for input(s): TSH in the last 8760 hours.  Physical Exam:    Today's Vitals   03/14/24 0908  BP: 118/72  Pulse: 74  Resp: 16  SpO2: 97%  Weight: 118 lb 9.6 oz (53.8 kg)  Height: 5' 1 (1.549 m)   Body mass index is 22.41 kg/m. Wt Readings from Last 3 Encounters:  03/14/24 118 lb 9.6 oz (53.8 kg)  01/10/24 123 lb (55.8 kg)  01/02/24 115 lb (52.2 kg)    Physical Exam  Constitutional: No distress.  hemodynamically stable  Neck: No JVD present.  Cardiovascular: Normal rate, S1 normal and S2 normal. An irregularly irregular rhythm present. Exam reveals no gallop, no S3 and no S4.  No murmur heard. Pulmonary/Chest: Effort normal and breath sounds normal. No stridor. She has no wheezes. She has no rales.  Musculoskeletal:        General: No edema.     Cervical back: Neck supple.  Skin: Skin is warm.   Impression & Recommendation(s):   Impression:   ICD-10-CM   1. Persistent atrial fibrillation (HCC)  I48.19 EKG 12-Lead    2. Long term current use of antiarrhythmic drug  Z79.899     3. Long term (current) use of anticoagulants  Z79.01     4. Benign hypertension  I10  5. Mixed hyperlipidemia  E78.2     6. OSA on CPAP  G47.33        Recommendation(s):  Persistent atrial fibrillation (HCC) Rate control: Toprol -XL. Rhythm control: Flecainide . Thromboembolic prophylaxis: Eliquis . Initial cardioversion in February 2022 and soon thereafter had ER AF.  Second cardioversion in January 2023. Flecainide  dose was reduced to 50 mg p.o. twice daily in the past due to EKG changes at higher dose.  Patient is aware to hold flecainide  if she develops acute kidney injury to avoid the risk of developing flecainide  toxicity. Patient is scheduled for atrial fibrillation ablation in September 2025 with Dr. Kennyth. Her questions regarding the procedure answered to the best of my abilities. Will see her in follow-up in 6 months sooner if needed  Long term current use of antiarrhythmic drug Indication: Persistent atrial fibrillation Continue flecainide  50 mg p.o. twice daily.  Long term (current) use of anticoagulants Currently on Eliquis  2.5 mg p.o. twice daily Does not endorse evidence of bleeding. Will have labs today as part of her ablation workup.  Benign hypertension Office and home blood pressures are well-controlled. Continue amlodipine  2.5 mg p.o. daily. Continue losartan  100 mg p.o. daily. Continue Toprol -XL 25 mg p.o. daily Known history of hyponatremia-if this becomes a chronic issue recommended follow-up with PCP for further evaluation and management  Spironolactone  was discontinued secondary to hyponatremia and started on low-dose amlodipine  which she is tolerating well  Mixed hyperlipidemia Currently on Lipitor 10 mg p.o. daily.   She denies myalgia or other side effects. Most recent lipids dated March 01, 2023, independently reviewed as noted above.  LDL is 66 mg/dL.  Cardiology is following peripherally.  OSA on CPAP Reemphasized importance of device compliance.  Orders Placed:  Orders Placed This Encounter  Procedures   EKG 12-Lead   Discussed management of at least 2 chronic comorbid conditions. EKG independently reviewed from 03/14/2024 Dr. Shaune consultation note from 01/10/2024 reviewed. Prescription drug management. Independently reviewed labs from [facility] database 03/01/2023    Final Medication List:   No orders of the defined types were placed in this encounter.   Medications Discontinued During This Encounter  Medication Reason   ondansetron  (ZOFRAN -ODT) 4 MG disintegrating tablet Patient Preference     Current Outpatient Medications:    acetaminophen  (TYLENOL ) 500 MG tablet, Take 500-1,000 mg by mouth every 6 (six) hours as needed (pain)., Disp: , Rfl:    ALPRAZolam (XANAX) 0.25 MG tablet, Take 0.25 mg by mouth See admin instructions. Take 1 tablet (0.25 mg) by mouth scheduled at night for sleep; may take an additional dose during the day if needed for anxiety, Disp: , Rfl:    amLODipine  (NORVASC ) 2.5 MG tablet, TAKE 1 TABLET BY MOUTH EVERY DAY, Disp: 90 tablet, Rfl: 2   atorvastatin (LIPITOR) 10 MG tablet, Take 10 mg by mouth in the morning., Disp: , Rfl:    Calcium Carb-Cholecalciferol (CALCIUM 600 + D PO), Take 1 tablet by mouth 2 (two) times daily., Disp: , Rfl:    cholecalciferol (VITAMIN D3) 25 MCG (1000 UT) tablet, Take 1,000 Units by mouth in the morning., Disp: , Rfl:    ELIQUIS  2.5 MG TABS tablet, TAKE 1 TABLET BY MOUTH TWICE A DAY, Disp: 180 tablet, Rfl: 1   flecainide  (TAMBOCOR ) 50 MG tablet, TAKE 1 TABLET BY MOUTH TWICE A DAY, Disp: 180 tablet, Rfl: 2   losartan  (COZAAR ) 100 MG tablet, TAKE 1 TABLET BY MOUTH EVERY DAY, Disp: 90 tablet, Rfl: 3   methocarbamol (ROBAXIN)  500 MG tablet, Take 250 mg by mouth at bedtime. Leg cramps, Disp: , Rfl:    metoprolol   succinate (TOPROL -XL) 25 MG 24 hr tablet, TAKE 1 TABLET (25 MG TOTAL) BY MOUTH DAILY., Disp: 90 tablet, Rfl: 2   nitroGLYCERIN  (NITROSTAT ) 0.4 MG SL tablet, DISSOLVE 1 TABLET UNDER THE TONGUE EVERY 5 MINUTES AS NEEDED FOR CHEST PAIN. IF YOU REQUIRE MORE THAN TWO TABLETS FIVE MINUTES APART GO TO THE NEAREST ER VIA EMS., Disp: 100 tablet, Rfl: 1   omeprazole (PRILOSEC) 20 MG capsule, Take 20 mg by mouth daily., Disp: , Rfl:   Consent:   NA  Disposition:   6 months follow-up sooner if needed for  Her questions and concerns were addressed to her satisfaction. She voices understanding of the recommendations provided during this encounter.    Signed, Madonna Michele HAS, [facility]  [facility]  A Division of [facility] [address] [place],  [address]   03/14/2024 9:37 AM

## 2024-03-14 NOTE — Patient Instructions (Signed)
 Medication Instructions:  The current medical regimen is effective;  continue present plan and medications.  *If you need a refill on your cardiac medications before your next appointment, please call your pharmacy*  Lab Work: NONE If you have labs (blood work) drawn today and your tests are completely normal, you will receive your results only by: MyChart Message (if you have MyChart) OR A paper copy in the mail If you have any lab test that is abnormal or we need to change your treatment, we will call you to review the results.  Testing/Procedures: NONE  Follow-Up: At [facility], you and your health needs are our priority.  As part of our continuing mission to provide you with exceptional heart care, our providers are all part of one team.  This team includes your primary Cardiologist (physician) and Advanced Practice Providers or APPs (Physician Assistants and Nurse Practitioners) who all work together to provide you with the care you need, when you need it.  Your next appointment:   6 Months  Provider:   Madonna Large, DO    We recommend signing up for the patient portal called MyChart.  Sign up information is provided on this After Visit Summary.  MyChart is used to connect with patients for Virtual Visits (Telemedicine).  Patients are able to view lab/test results, encounter notes, upcoming appointments, etc.  Non-urgent messages can be sent to your provider as well.   To learn more about what you can do with MyChart, go to [email or web address].

## 2024-03-15 DIAGNOSIS — Z1339 Encounter for screening examination for other mental health and behavioral disorders: Secondary | ICD-10-CM | POA: Diagnosis not present

## 2024-03-15 DIAGNOSIS — Z23 Encounter for immunization: Secondary | ICD-10-CM | POA: Diagnosis not present

## 2024-03-15 DIAGNOSIS — Z1331 Encounter for screening for depression: Secondary | ICD-10-CM | POA: Diagnosis not present

## 2024-03-15 DIAGNOSIS — Z7901 Long term (current) use of anticoagulants: Secondary | ICD-10-CM | POA: Diagnosis not present

## 2024-03-15 DIAGNOSIS — I4819 Other persistent atrial fibrillation: Secondary | ICD-10-CM | POA: Diagnosis not present

## 2024-03-15 DIAGNOSIS — F419 Anxiety disorder, unspecified: Secondary | ICD-10-CM | POA: Diagnosis not present

## 2024-03-15 DIAGNOSIS — D6869 Other thrombophilia: Secondary | ICD-10-CM | POA: Diagnosis not present

## 2024-03-15 DIAGNOSIS — Z0000 Encounter for general adult medical examination without abnormal findings: Secondary | ICD-10-CM | POA: Diagnosis not present

## 2024-03-15 LAB — BASIC METABOLIC PANEL WITH GFR
BUN/Creatinine Ratio: 11 — ABNORMAL LOW (ref 12–28)
BUN: 9 mg/dL (ref 8–27)
CO2: 21 mmol/L (ref 20–29)
Calcium: 9.6 mg/dL (ref 8.7–10.3)
Chloride: 101 mmol/L (ref 96–106)
Creatinine, Ser: 0.83 mg/dL (ref 0.57–1.00)
Glucose: 79 mg/dL (ref 70–99)
Potassium: 3.9 mmol/L (ref 3.5–5.2)
Sodium: 139 mmol/L (ref 134–144)
eGFR: 70 mL/min/1.73 (ref >59)

## 2024-03-15 LAB — CBC
Hematocrit: 42.1 (ref 34.0–46.6)
Hemoglobin: 13.7 g/dL (ref 11.1–15.9)
MCH: 29.1 pg (ref 26.6–33.0)
MCHC: 32.5 g/dL (ref 31.5–35.7)
MCV: 90 fL (ref 79–97)
Platelets: 218 x10E3/uL (ref 150–450)
RBC: 4.7 x10E6/uL (ref 3.77–5.28)
RDW: 12.1 (ref 11.7–15.4)
WBC: 6.2 x10E3/uL (ref 3.4–10.8)

## 2024-03-16 ENCOUNTER — Encounter (HOSPITAL_COMMUNITY): Payer: Self-pay

## 2024-03-21 ENCOUNTER — Ambulatory Visit (HOSPITAL_COMMUNITY)
Admission: RE | Admit: 2024-03-21 | Discharge: 2024-03-21 | Disposition: A | Discharge status: Home / Self Care | Source: Ambulatory Visit | Attending: Internal Medicine | Admitting: Internal Medicine

## 2024-03-21 DIAGNOSIS — I4819 Other persistent atrial fibrillation: Secondary | ICD-10-CM | POA: Diagnosis not present

## 2024-03-21 MED ORDER — IOHEXOL 350 MG/ML SOLN
80.0000 mL | Freq: Once | INTRAVENOUS | Status: AC | PRN
  Administered 2024-03-21: 80 mL via INTRAVENOUS

## 2024-03-27 ENCOUNTER — Telehealth: Payer: Self-pay | Admitting: Cardiology

## 2024-03-27 DIAGNOSIS — Z7901 Long term (current) use of anticoagulants: Secondary | ICD-10-CM

## 2024-03-27 DIAGNOSIS — I480 Paroxysmal atrial fibrillation: Secondary | ICD-10-CM

## 2024-03-27 MED ORDER — APIXABAN 2.5 MG PO TABS: 2.5000 mg | ORAL_TABLET | Freq: Two times a day (BID) | ORAL | 1 refills | Status: AC

## 2024-03-27 NOTE — Telephone Encounter (Signed)
*  STAT* If patient is at the pharmacy, call can be transferred to refill team.   1. Which medications need to be refilled? (please list name of each medication and dose if known) ELIQUIS  2.5 MG TABS tablet    4. Which pharmacy/location (including street and city if local pharmacy) is medication to be sent to?  [id number] - [place], [place] - [address]     5. Do they need a 30 day or 90 day supply? 90    Pt states she is completely out

## 2024-03-27 NOTE — Telephone Encounter (Signed)
 Prescription refill request for Eliquis  received. Indication: a fib Last office visit:03/14/24 Scr:0.83 epic 03/14/24 Age: 83 Weight: 53 kg

## 2024-04-06 ENCOUNTER — Other Ambulatory Visit: Payer: Self-pay | Admitting: Cardiology

## 2024-04-06 DIAGNOSIS — I10 Essential (primary) hypertension: Secondary | ICD-10-CM

## 2024-04-10 NOTE — Pre-Procedure Instructions (Signed)
 Attempted to call patient regarding procedure instructions.  Left voicemail on the following items: Arrival time 0800 Nothing to eat or drink after midnight No meds AM of procedure Responsible person to drive you home and stay with you for 24 hrs  Have you missed any doses of anti-coagulant Eliquis- should be taken twice a day, if you have missed any doses please let us know.  Don't take dose morning of procedure.

## 2024-04-11 ENCOUNTER — Encounter (HOSPITAL_COMMUNITY): Payer: Self-pay | Admitting: Cardiology

## 2024-04-11 ENCOUNTER — Other Ambulatory Visit: Payer: Self-pay

## 2024-04-11 ENCOUNTER — Ambulatory Visit (HOSPITAL_COMMUNITY)

## 2024-04-11 ENCOUNTER — Ambulatory Visit (HOSPITAL_COMMUNITY)
Admission: RE | Admit: 2024-04-11 | Discharge: 2024-04-11 | Disposition: A | Discharge status: Home / Self Care | Attending: Cardiology | Admitting: Cardiology

## 2024-04-11 ENCOUNTER — Ambulatory Visit (HOSPITAL_COMMUNITY)
Admission: RE | Disposition: A | Discharge status: Home / Self Care | Payer: Self-pay | Source: Home / Self Care | Attending: Cardiology

## 2024-04-11 DIAGNOSIS — I3139 Other pericardial effusion (noninflammatory): Secondary | ICD-10-CM | POA: Diagnosis not present

## 2024-04-11 DIAGNOSIS — I4819 Other persistent atrial fibrillation: Secondary | ICD-10-CM

## 2024-04-11 DIAGNOSIS — Z8249 Family history of ischemic heart disease and other diseases of the circulatory system: Secondary | ICD-10-CM | POA: Insufficient documentation

## 2024-04-11 DIAGNOSIS — I10 Essential (primary) hypertension: Secondary | ICD-10-CM | POA: Insufficient documentation

## 2024-04-11 DIAGNOSIS — D6869 Other thrombophilia: Secondary | ICD-10-CM | POA: Insufficient documentation

## 2024-04-11 DIAGNOSIS — Z7901 Long term (current) use of anticoagulants: Secondary | ICD-10-CM | POA: Diagnosis not present

## 2024-04-11 HISTORY — PX: ATRIAL FIBRILLATION ABLATION: EP1191

## 2024-04-11 LAB — POCT ACTIVATED CLOTTING TIME
Activated Clotting Time: 285 s
Activated Clotting Time: 320 s

## 2024-04-11 SURGERY — ATRIAL FIBRILLATION ABLATION
Anesthesia: General

## 2024-04-11 MED ORDER — ATROPINE SULFATE 1 MG/10ML IJ SOSY
PREFILLED_SYRINGE | INTRAMUSCULAR | Status: DC | PRN
  Administered 2024-04-11: 1 mg via INTRAVENOUS

## 2024-04-11 MED ORDER — SODIUM CHLORIDE 0.9 % IV SOLN
Freq: Once | INTRAVENOUS | Status: AC
  Filled 2024-04-11: qty 10

## 2024-04-11 MED ORDER — APIXABAN 2.5 MG PO TABS
2.5000 mg | ORAL_TABLET | Freq: Once | ORAL | Status: AC
  Administered 2024-04-11: 2.5 mg via ORAL
  Filled 2024-04-11: qty 1

## 2024-04-11 MED ORDER — ONDANSETRON HCL 4 MG/2ML IJ SOLN
INTRAMUSCULAR | Status: DC | PRN
  Administered 2024-04-11: 4 mg via INTRAVENOUS

## 2024-04-11 MED ORDER — SODIUM CHLORIDE 0.9 % IV SOLN: INTRAVENOUS | Status: DC

## 2024-04-11 MED ORDER — BIVALIRUDIN TRIFLUOROACETATE 250 MG IV SOLR
INTRAVENOUS | Status: AC
Start: 2024-04-11 — End: 2024-04-11
  Filled 2024-04-11: qty 250

## 2024-04-11 MED ORDER — PHENYLEPHRINE HCL-NACL 20-0.9 MG/250ML-% IV SOLN
INTRAVENOUS | Status: DC | PRN
  Administered 2024-04-11: 25 ug/min via INTRAVENOUS

## 2024-04-11 MED ORDER — PROPOFOL 10 MG/ML IV BOLUS
INTRAVENOUS | Status: DC | PRN
  Administered 2024-04-11: 90 mg via INTRAVENOUS
  Administered 2024-04-11: 20 mg via INTRAVENOUS

## 2024-04-11 MED ORDER — DEXAMETHASONE SODIUM PHOSPHATE 10 MG/ML IJ SOLN
INTRAMUSCULAR | Status: DC | PRN
  Administered 2024-04-11: 10 mg via INTRAVENOUS

## 2024-04-11 MED ORDER — ONDANSETRON HCL 4 MG/2ML IJ SOLN: 4.0000 mg | Freq: Four times a day (QID) | INTRAMUSCULAR | Status: DC | PRN

## 2024-04-11 MED ORDER — SODIUM CHLORIDE 0.9% FLUSH: 3.0000 mL | INTRAVENOUS | Status: DC | PRN

## 2024-04-11 MED ORDER — SODIUM CHLORIDE 0.9% FLUSH: 3.0000 mL | Freq: Two times a day (BID) | INTRAVENOUS | Status: DC

## 2024-04-11 MED ORDER — HEPARIN (PORCINE) IN NACL 1000-0.9 UT/500ML-% IV SOLN: INTRAVENOUS | Status: DC | PRN

## 2024-04-11 MED ORDER — ROCURONIUM BROMIDE 10 MG/ML (PF) SYRINGE
PREFILLED_SYRINGE | INTRAVENOUS | Status: DC | PRN
  Administered 2024-04-11: 60 mg via INTRAVENOUS
  Administered 2024-04-11: 10 mg via INTRAVENOUS

## 2024-04-11 MED ORDER — LIDOCAINE 2% (20 MG/ML) 5 ML SYRINGE
INTRAMUSCULAR | Status: DC | PRN
  Administered 2024-04-11: 40 mg via INTRAVENOUS

## 2024-04-11 MED ORDER — PROTAMINE SULFATE 10 MG/ML IV SOLN
INTRAVENOUS | Status: DC | PRN
Start: 2024-04-11 — End: 2024-04-11
  Administered 2024-04-11: 30 mg via INTRAVENOUS
  Administered 2024-04-11: 5 mg via INTRAVENOUS

## 2024-04-11 MED ORDER — SUGAMMADEX SODIUM 200 MG/2ML IV SOLN
INTRAVENOUS | Status: DC | PRN
  Administered 2024-04-11: 200 mg via INTRAVENOUS

## 2024-04-11 MED ORDER — HEPARIN SODIUM (PORCINE) 1000 UNIT/ML IJ SOLN
INTRAMUSCULAR | Status: DC | PRN
  Administered 2024-04-11: 9000 [IU] via INTRAVENOUS
  Administered 2024-04-11: 3000 [IU] via INTRAVENOUS

## 2024-04-11 MED ORDER — FENTANYL CITRATE (PF) 100 MCG/2ML IJ SOLN
INTRAMUSCULAR | Status: AC
  Filled 2024-04-11: qty 2

## 2024-04-11 MED ORDER — FENTANYL CITRATE (PF) 250 MCG/5ML IJ SOLN
INTRAMUSCULAR | Status: DC | PRN
  Administered 2024-04-11: 25 ug via INTRAVENOUS
  Administered 2024-04-11: 75 ug via INTRAVENOUS

## 2024-04-11 MED ORDER — SODIUM CHLORIDE 0.9 % IV SOLN: 250.0000 mL | INTRAVENOUS | Status: DC | PRN

## 2024-04-11 MED ORDER — ACETAMINOPHEN 325 MG PO TABS: 650.0000 mg | ORAL_TABLET | ORAL | Status: DC | PRN

## 2024-04-11 SURGICAL SUPPLY — 20 items
BAG SNAP BAND KOVER 36X36 (MISCELLANEOUS) IMPLANT
BLANKET WARM UNDERBOD FULL ACC (MISCELLANEOUS) ×1 IMPLANT
CABLE FARASTAR GEN2 SNGL USE (CABLE) IMPLANT
CATH BI DIR 7FR CS F-J 12 PIN (CATHETERS) IMPLANT
CATH FARAWAVE 2.0 31 (CATHETERS) IMPLANT
CATH GE 8FR SOUNDSTAR (CATHETERS) IMPLANT
CATH OCTARAY 2.0 F 3-3-3-3-3 (CATHETERS) IMPLANT
CLOSURE PERCLOSE PROSTYLE (Vascular Products) IMPLANT
COVER SWIFTLINK CONNECTOR (BAG) ×1 IMPLANT
DEVICE CLOSURE MYNXGRIP 6/7F (Vascular Products) IMPLANT
DILATOR VESSEL 38 20CM 16FR (INTRODUCER) IMPLANT
GUIDEWIRE INQWIRE 1.5J.035X260 (WIRE) IMPLANT
KIT VERSACROSS CNCT FARADRIVE (KITS) IMPLANT
PACK EP LF (CUSTOM PROCEDURE TRAY) ×1 IMPLANT
PAD DEFIB RADIO PHYSIO CONN (PAD) ×1 IMPLANT
PATCH CARTO3 (PAD) IMPLANT
SHEATH FARADRIVE STEERABLE (SHEATH) IMPLANT
SHEATH PINNACLE 8F 10CM (SHEATH) IMPLANT
SHEATH PINNACLE 9F 10CM (SHEATH) IMPLANT
SHEATH PROBE COVER 6X72 (BAG) IMPLANT

## 2024-04-11 NOTE — Progress Notes (Signed)
 Patient and son was given discharge instructions. Both verbalized understanding.

## 2024-04-11 NOTE — Discharge Instructions (Signed)

## 2024-04-11 NOTE — Anesthesia Procedure Notes (Signed)
 Procedure Name: Intubation Date/Time: 04/11/2024 10:52 AM  Performed by: Alen Motto D, CRNAPre-anesthesia Checklist: Patient identified, Emergency Drugs available, Suction available and Patient being monitored Patient Re-evaluated:Patient Re-evaluated prior to induction Oxygen Delivery Method: Circle System Utilized Preoxygenation: Pre-oxygenation with 100% oxygen Induction Type: IV induction Ventilation: Mask ventilation without difficulty Laryngoscope Size: Mac and 3 Grade View: Grade I Tube type: Oral Tube size: 7.0 mm Number of attempts: 1 Airway Equipment and Method: Stylet and Oral airway Placement Confirmation: ETT inserted through vocal cords under direct vision, positive ETCO2 and breath sounds checked- equal and bilateral Secured at: 20 cm Tube secured with: Tape Dental Injury: Teeth and Oropharynx as per pre-operative assessment

## 2024-04-11 NOTE — H&P (Addendum)
 Electrophysiology Note:   Date:  04/11/24  ID:  Kelly Blackwell, DOB Dec 01, 1940, MRN [id number]   Primary Cardiologist: Kelly Large, DO Electrophysiologist: Kelly Kitty, MD       History of Present Illness:   Kelly Blackwell is a 83 y.o. female with h/o breast cancer, OSA, HTN, HLD, persistent atrial fibrillation who is being seen today for evaluation for catheter ablation at the request of Dr. Court.   Discussed the use of AI scribe software for clinical note transcription with the patient, who gave verbal consent to proceed.   History of Present Illness Kelly Blackwell is an 83 year old female with atrial fibrillation who presents for evaluation of her condition. She was first diagnosed with atrial fibrillation in November 2021 and has been in persistent atrial fibrillation since then. She has undergone cardioversion twice; the first was successful for a short period, but she soon reverted to atrial fibrillation. The second cardioversion was more successful, maintaining normal rhythm for two years. Her primary symptom of atrial fibrillation is extreme tiredness. During a trip to [facility], she nearly experienced syncope due to exhaustion, which she attributes to her condition. She feels more fatigued when in atrial fibrillation.   Her current medication regimen includes flecainide , which she started between her first and second cardioversion. The medication was effective for a while, allowing her to stay in normal sinus rhythm for two years.   Family history is significant for atrial fibrillation, as her mother also had the condition and passed away at 34 from congestive heart failure. This family history contributes to her concern about her own health trajectory.   She has a good level of physical activity for her age. Otherwise doing well with no new or acute complaints today.    Interval: Patient presents today for planned catheter ablation. Reports some fatigue, otherwise feeling  relatively well. No new or acute complaints today.   Review of systems complete and found to be negative unless listed in HPI.    EP Information / Studies Reviewed:     EKG is not ordered today. EKG from 01/02/24 reviewed which showed AF with slow ventricular response.          Exercise Treadmill 12/10/22:  Exercise treadmill stress test 12/13/2022: Exercise treadmill stress test performed using Bruce protocol.  Patient exercised for a total of 5 minutes and 25 seconds, achieving 7.0 METS, and 78% of age predicted maximum heart rate.  Exercise capacity was fair.  No chest pain reported.  Dyspnea reported. Normal heart rate and hemodynamic response. Stress EKG revealed no ischemic changes. Low risk study.   Echo 11/21/19: Normal LV systolic function with visual EF 60-65%. Left ventricle cavity  is normal in size. Mild left ventricular hypertrophy. Normal global wall  motion. Indeterminate diastolic filling pattern, elevated LAP. Calculated  EF 57%.  Mild (Grade I) mitral regurgitation.  Mild tricuspid regurgitation.  No prior study for comparison.    Risk Assessment/Calculations:     CHA2DS2-VASc Score = 4   This indicates a 4.8% annual risk of stroke. The patient's score is based upon: CHF History: 0 HTN History: 1 Diabetes History: 0 Stroke History: 0 Vascular Disease History: 0 Age Score: 2 Gender Score: 1               Physical Exam:    Today's Vitals   04/11/24 0800  BP: (!) 152/78  Pulse: 67  Resp: 18  Temp: 98.1 F (36.7 C)  TempSrc: Oral  SpO2: 99%  Weight: 52.2 kg  Height: 5' 1 (1.549 m)  PainSc: 0-No pain   Body mass index is 21.73 kg/m.   GEN: Well nourished, well developed in no acute distress NECK: No JVD CARDIAC: Normal rate, irregular RESPIRATORY:  Clear to auscultation without rales, wheezing or rhonchi  ABDOMEN: Soft, non-distended EXTREMITIES:  No edema; No deformity    ASSESSMENT AND PLAN:     #. Persistent atrial fibrillation,  symptomatic: Continues to have AF despite anti-arrhythmic therapy. She reports significant fatigue associated with AF. Additionally, she is worried about long term risks associated with progression to permanent AF, such as stroke, dementia and HF.  #. Secondary hypercoagulable state due to AF #. High risk medication use: Flecainide . QRS normal. Unable to assess PR d/t AF. Stress test normal 11/2022.  -Discussed treatment options today for AF including antiarrhythmic drug therapy and ablation. Discussed risks, recovery and likelihood of success with each treatment strategy. Risk, benefits, and alternatives to EP study and ablation for afib were discussed. These risks include but are not limited to stroke, bleeding, vascular damage, tamponade, perforation, damage to the esophagus, lungs, phrenic nerve and other structures, pulmonary vein stenosis, worsening renal function, coronary vasospasm and death.  Discussed potential need for repeat ablation procedures and antiarrhythmic drugs after an initial ablation. The patient understands these risk and wishes to proceed today.  -Continue flecainide  50mg  BID.  -Continue metoprololr XL 25mg  daily.  -Continue Eliquis  2.5mg  BID.     #?Alpha-gal: Patient states that this was a remote issue years ago and has since resolved.  She now consumes meat products, including pork, without any issue.  If this is the case, then no need for bivalirudin  at time of procedure.  Follow up with Dr. Kennyth 3 months after ablation.    Signed, Kelly Kennyth, MD

## 2024-04-11 NOTE — Transfer of Care (Signed)
 Immediate Anesthesia Transfer of Care Note  Patient: Kelly Blackwell  Procedure(s) Performed: ATRIAL FIBRILLATION ABLATION  Patient Location: PACU  Anesthesia Type:General  Level of Consciousness: drowsy  Airway & Oxygen Therapy: Patient Spontanous Breathing and Patient connected to face mask oxygen  Post-op Assessment: Report given to RN and Post -op Vital signs reviewed and stable  Post vital signs: Reviewed and stable  Last Vitals:  Vitals Value Taken Time  BP 149/70 04/11/24 12:26  Temp    Pulse 79 04/11/24 12:30  Resp 12 04/11/24 12:30  SpO2 98 % 04/11/24 12:30  Vitals shown include unfiled device data.  Last Pain:  Vitals:   04/11/24 0800  TempSrc: Oral  PainSc: 0-No pain         Complications: There were no known notable events for this encounter.

## 2024-04-11 NOTE — Anesthesia Preprocedure Evaluation (Signed)
 Anesthesia Evaluation  Patient identified by MRN, date of birth, ID band Patient awake    Reviewed: Allergy & Precautions, NPO status , Patient's Chart, lab work & pertinent test results  History of Anesthesia Complications Negative for: history of anesthetic complications  Airway Mallampati: II  TM Distance: >3 FB Neck ROM: Full    Dental  (+) Teeth Intact, Dental Advisory Given   Pulmonary neg shortness of breath, sleep apnea and Continuous Positive Airway Pressure Ventilation , neg COPD, neg recent URI   breath sounds clear to auscultation       Cardiovascular hypertension, Pt. on medications and Pt. on home beta blockers (-) angina (-) CHF + dysrhythmias Atrial Fibrillation  Rhythm:Regular  . Left ventricular ejection fraction, by estimation, is 60 to 65%. The  left ventricle has normal function. The left ventricle has no regional  wall motion abnormalities. Left ventricular diastolic parameters are  indeterminate.   2. Right ventricular systolic function is normal. The right ventricular  size is normal. There is normal pulmonary artery systolic pressure. The  estimated right ventricular systolic pressure is 34.8 mmHg.   3. Left atrial size was moderately dilated.   4. Right atrial size was mildly dilated.   5. A small pericardial effusion is present. The pericardial effusion is  circumferential.   6. The mitral valve is normal in structure. Mild mitral valve  regurgitation. No evidence of mitral stenosis.   7. The aortic valve is tricuspid. Aortic valve regurgitation is not  visualized. No aortic stenosis is present.   8. The inferior vena cava is normal in size with greater than 50%  respiratory variability, suggesting right atrial pressure of 3 mmHg.   9. The patient was in atrial fibrillation.     Neuro/Psych  PSYCHIATRIC DISORDERS Anxiety     negative neurological ROS     GI/Hepatic Neg liver ROS,GERD  Medicated  and Controlled,,  Endo/Other  negative endocrine ROS    Renal/GU negative Renal ROS     Musculoskeletal  (+) Arthritis ,    Abdominal   Peds  Hematology  (+) Blood dyscrasia eliquis    Anesthesia Other Findings   Reproductive/Obstetrics                              Anesthesia Physical Anesthesia Plan  ASA: 2  Anesthesia Plan: General   Post-op Pain Management: Minimal or no pain anticipated   Induction: Intravenous  PONV Risk Score and Plan: 3 and Ondansetron  and Dexamethasone   Airway Management Planned: Oral ETT  Additional Equipment: None  Intra-op Plan:   Post-operative Plan: Extubation in OR  Informed Consent: I have reviewed the patients History and Physical, chart, labs and discussed the procedure including the risks, benefits and alternatives for the proposed anesthesia with the patient or authorized representative who has indicated his/her understanding and acceptance.     Dental advisory given  Plan Discussed with: CRNA  Anesthesia Plan Comments:          Anesthesia Quick Evaluation

## 2024-04-12 MED FILL — Fentanyl Citrate Preservative Free (PF) Inj 100 MCG/2ML: INTRAMUSCULAR | Qty: 2 | Status: AC

## 2024-04-12 NOTE — Anesthesia Postprocedure Evaluation (Signed)
 Anesthesia Post Note  Patient: Kelly Blackwell  Procedure(s) Performed: ATRIAL FIBRILLATION ABLATION     Patient location during evaluation: PACU Anesthesia Type: General Level of consciousness: awake and alert Pain management: pain level controlled Vital Signs Assessment: post-procedure vital signs reviewed and stable Respiratory status: spontaneous breathing, nonlabored ventilation and respiratory function stable Cardiovascular status: blood pressure returned to baseline and stable Postop Assessment: no apparent nausea or vomiting Anesthetic complications: no   There were no known notable events for this encounter.                  Senie Lanese

## 2024-05-09 ENCOUNTER — Ambulatory Visit (HOSPITAL_COMMUNITY)
Admission: RE | Admit: 2024-05-09 | Discharge: 2024-05-09 | Disposition: A | Discharge status: Home / Self Care | Source: Ambulatory Visit | Attending: Physician Assistant | Admitting: Physician Assistant

## 2024-05-09 VITALS — BP 148/78 | HR 69 | Ht 61.0 in | Wt 119.2 lb

## 2024-05-09 DIAGNOSIS — Z5181 Encounter for therapeutic drug level monitoring: Secondary | ICD-10-CM | POA: Diagnosis not present

## 2024-05-09 DIAGNOSIS — I4819 Other persistent atrial fibrillation: Secondary | ICD-10-CM

## 2024-05-09 DIAGNOSIS — D6869 Other thrombophilia: Secondary | ICD-10-CM

## 2024-05-09 DIAGNOSIS — Z79899 Other long term (current) drug therapy: Secondary | ICD-10-CM | POA: Diagnosis not present

## 2024-05-09 DIAGNOSIS — I4891 Unspecified atrial fibrillation: Secondary | ICD-10-CM | POA: Diagnosis not present

## 2024-05-09 NOTE — Progress Notes (Signed)
 Primary Care Physician: Yolande Toribio MATSU, MD Primary Cardiologist: Madonna Large, DO Electrophysiologist: Fonda Kitty, MD  Referring Physician: Dr Kitty Elveria VEAR Kelly Blackwell is a 83 y.o. female with a history of breast cancer, OSA, HTN, HLD, atrial fibrillation who presents for follow up in the [facility]. She was first diagnosed with atrial fibrillation in November 2021. She has undergone cardioversion twice; the first was successful for a short period, but she soon reverted to atrial fibrillation. The second cardioversion was more successful, maintaining normal rhythm for two years. She has been maintained on flecainide . Her primary symptom of atrial fibrillation is extreme tiredness. Patient is on Eliquis  for stroke prevention. She was seen by Dr Kitty and underwent afib ablation on 04/11/24.   Patient presents today for follow up for atrial fibrillation and flecainide  monitoring. She reports that she noticed persistent fatigue soon after her ablation. She checked her Crist mobile about one week later and it showed afib. She remains in afib today. She has been under considerable stress with her husband's health. No bleeding issues on anticoagulation.   Today, she denies symptoms of palpitations, chest pain, shortness of breath, orthopnea, PND, lower extremity edema, dizziness, presyncope, syncope, bleeding, or neurologic sequela. The patient is tolerating medications without difficulties and is otherwise without complaint today.    Atrial Fibrillation Risk Factors:  she does have symptoms or diagnosis of sleep apnea. she does not have a history of rheumatic fever.   Atrial Fibrillation Management history:  Previous antiarrhythmic drugs: flecainide  Previous cardioversions: 09/03/20,07/22/21 Previous ablations: 04/11/24 Anticoagulation history: Eliquis   ROS- All systems are reviewed and negative except as per the HPI above.  Past Medical History:   Diagnosis Date   Allergy    seasonal allergies.    Anxiety    Arthritis    Neck   Atrial fibrillation (HCC)    Breast cancer (HCC)    Hypercholesteremia    Hypertension    OSA on CPAP 12/2019   Osteopenia    Personal history of radiation therapy     Current Outpatient Medications  Medication Sig Dispense Refill   acetaminophen  (TYLENOL ) 500 MG tablet Take 500-1,000 mg by mouth every 6 (six) hours as needed (pain). (Patient taking differently: Take 500-1,000 mg by mouth as needed (pain).)     amLODipine  (NORVASC ) 2.5 MG tablet TAKE 1 TABLET BY MOUTH EVERY DAY 90 tablet 3   apixaban  (ELIQUIS ) 2.5 MG TABS tablet Take 1 tablet (2.5 mg total) by mouth 2 (two) times daily. 180 tablet 1   atorvastatin (LIPITOR) 10 MG tablet Take 10 mg by mouth in the morning.     Calcium Carb-Cholecalciferol (CALCIUM 600 + D PO) Take 1 tablet by mouth 2 (two) times daily.     cholecalciferol (VITAMIN D3) 25 MCG (1000 UT) tablet Take 1,000 Units by mouth in the morning.     flecainide  (TAMBOCOR ) 50 MG tablet TAKE 1 TABLET BY MOUTH TWICE A DAY 180 tablet 2   losartan  (COZAAR ) 100 MG tablet TAKE 1 TABLET BY MOUTH EVERY DAY 90 tablet 3   metoprolol  succinate (TOPROL -XL) 25 MG 24 hr tablet TAKE 1 TABLET (25 MG TOTAL) BY MOUTH DAILY. 90 tablet 2   nitroGLYCERIN  (NITROSTAT ) 0.4 MG SL tablet DISSOLVE 1 TABLET UNDER THE TONGUE EVERY 5 MINUTES AS NEEDED FOR CHEST PAIN. IF YOU REQUIRE MORE THAN TWO TABLETS FIVE MINUTES APART GO TO THE NEAREST ER VIA EMS. 100 tablet 1   omeprazole (PRILOSEC) 20 MG capsule  Take 20 mg by mouth 2 (two) times daily before a meal.     ALPRAZolam (XANAX) 0.5 MG tablet Take 0.5 mg by mouth See admin instructions. Take 1 tablet (0.5 mg) by mouth scheduled at night for sleep; may take an additional dose during the day if needed for anxiety     No current facility-administered medications for this encounter.    Physical Exam: BP (!) 148/78   Pulse 69   Ht 5' 1 (1.549 m)   Wt 54.1 kg    BMI 22.52 kg/m   GEN: Well nourished, well developed in no acute distress CARDIAC: Irregularly irregular rate and rhythm, no murmurs, rubs, gallops RESPIRATORY:  Clear to auscultation without rales, wheezing or rhonchi  ABDOMEN: Soft, non-tender, non-distended EXTREMITIES:  No edema; No deformity   Wt Readings from Last 3 Encounters:  05/09/24 54.1 kg  04/11/24 52.2 kg  03/14/24 53.8 kg     EKG today demonstrates  Afib Vent. rate 69 BPM PR interval * ms QRS duration 92 ms QT/QTcB 392/420 ms   Echo 02/27/24 demonstrated   1. Left ventricular ejection fraction, by estimation, is 60 to 65%. The  left ventricle has normal function. The left ventricle has no regional  wall motion abnormalities. Left ventricular diastolic parameters are  indeterminate.   2. Right ventricular systolic function is normal. The right ventricular  size is normal. There is normal pulmonary artery systolic pressure. The  estimated right ventricular systolic pressure is 34.8 mmHg.   3. Left atrial size was moderately dilated.   4. Right atrial size was mildly dilated.   5. A small pericardial effusion is present. The pericardial effusion is  circumferential.   6. The mitral valve is normal in structure. Mild mitral valve  regurgitation. No evidence of mitral stenosis.   7. The aortic valve is tricuspid. Aortic valve regurgitation is not  visualized. No aortic stenosis is present.   8. The inferior vena cava is normal in size with greater than 50%  respiratory variability, suggesting right atrial pressure of 3 mmHg.   9. The patient was in atrial fibrillation.     CHA2DS2-VASc Score = 4  The patient's score is based upon: CHF History: 0 HTN History: 1 Diabetes History: 0 Stroke History: 0 Vascular Disease History: 0 Age Score: 2 Gender Score: 1       ASSESSMENT AND PLAN: Persistent Atrial Fibrillation (ICD10:  I48.19) The patient's CHA2DS2-VASc score is 4, indicating a 4.8% annual risk of  stroke.   S/p afib ablation 04/11/24. She is back in afib, possibly persistent. She has symptoms of fatigue. We discussed rhythm control options including DCCV. We discussed the blanking period post ablation and the rational for getting back in SR. She voices understanding but defers for now given her husbands poor health.She agrees to monitor daily with [facility] mobile. If she is persistent and not planning on DCCV, will discontinue flecainide . If paroxysmal, will continue flecainide  for now.  Continue Eliquis  2.5 mg BID Continue Toprol  25 mg daily  Secondary Hypercoagulable State (ICD10:  D68.69) The patient is at significant risk for stroke/thromboembolism based upon her CHA2DS2-VASc Score of 4.  Continue Apixaban  (Eliquis ). No bleeding issues.   High Risk Medication Monitoring (ICD 10: [id number]) Patient requires ongoing monitoring for anti-arrhythmic medication which has the potential to cause life threatening arrhythmias. Intervals on ECG acceptable for flecainide  monitoring.     HTN Stable on current regimen  OSA  Encouraged nightly CPAP   Follow up with Daphne Barrack  as scheduled.    [facility] [address] [place], [place] [address] [phone]

## 2024-06-03 ENCOUNTER — Encounter: Payer: Self-pay | Admitting: Cardiology

## 2024-06-03 DIAGNOSIS — I10 Essential (primary) hypertension: Secondary | ICD-10-CM

## 2024-06-05 MED ORDER — AMLODIPINE BESYLATE 5 MG PO TABS: 5.0000 mg | ORAL_TABLET | Freq: Every day | ORAL | 1 refills | Status: AC

## 2024-06-05 NOTE — Telephone Encounter (Signed)
 Please send in a script for Norvasc  5mg  po at bedtime.  She can take Losartan  in the morning.   Dr. Satvik Parco

## 2024-06-11 NOTE — Progress Notes (Unsigned)
 Kelly Blackwell

## 2024-06-12 ENCOUNTER — Telehealth: Payer: PPO | Admitting: Adult Health

## 2024-06-12 DIAGNOSIS — G4733 Obstructive sleep apnea (adult) (pediatric): Secondary | ICD-10-CM | POA: Diagnosis not present

## 2024-06-12 NOTE — Patient Instructions (Signed)
 Continue using CPAP nightly and greater than 4 hours each night If your symptoms worsen or you develop new symptoms please let us  know.

## 2024-06-12 NOTE — Progress Notes (Signed)
 PATIENT: Kelly Blackwell DOB: [birth year]  REASON FOR VISIT: follow up HISTORY FROM: patient    Virtual Visit via Video Note  I connected with Kelly Blackwell on 06/12/24 at  1:00 PM EST by a video enabled telemedicine application located remotely at [facility] and verified that I am speaking with the correct person using two identifiers who was located at their own home in [place].    I discussed the limitations of evaluation and management by telemedicine and the availability of in person appointments. The patient expressed understanding and agreed to proceed.   PATIENT: Kelly Blackwell DOB: [birth year]  REASON FOR VISIT: follow up HISTORY FROM: patient  HISTORY OF PRESENT ILLNESS: Today 06/12/24  Kelly Blackwell is a 83 y.o. female with a history of OSA on CPAP. Returns today for follow-up. Reports that CPAP works well. Has noticed dry mouth. Currently wears the nasal pillows.  Does not wish to switch to a different mask.  Download is below      REVIEW OF SYSTEMS: Out of a complete 14 system review of symptoms, the patient complains only of the following symptoms, and all other reviewed systems are negative.  ALLERGIES: Allergies  Allergen Reactions   Escitalopram     Other Reaction(s): hyponatremia, nausea and vomiting   Sulfa Antibiotics Rash and Other (See Comments)    Fever and rash     HOME MEDICATIONS: Outpatient Medications Prior to Visit  Medication Sig Dispense Refill   acetaminophen  (TYLENOL ) 500 MG tablet Take 500-1,000 mg by mouth every 6 (six) hours as needed (pain). (Patient taking differently: Take 500-1,000 mg by mouth as needed (pain).)     ALPRAZolam (XANAX) 0.5 MG tablet Take 0.5 mg by mouth See admin instructions. Take 1 tablet (0.5 mg) by mouth scheduled at night for sleep; may take an additional dose during the day if needed for anxiety     amLODipine  (NORVASC ) 5 MG tablet Take 1 tablet (5 mg total) by mouth at bedtime. 90  tablet 1   apixaban  (ELIQUIS ) 2.5 MG TABS tablet Take 1 tablet (2.5 mg total) by mouth 2 (two) times daily. 180 tablet 1   atorvastatin (LIPITOR) 10 MG tablet Take 10 mg by mouth in the morning.     Calcium Carb-Cholecalciferol (CALCIUM 600 + D PO) Take 1 tablet by mouth 2 (two) times daily.     cholecalciferol (VITAMIN D3) 25 MCG (1000 UT) tablet Take 1,000 Units by mouth in the morning.     flecainide  (TAMBOCOR ) 50 MG tablet TAKE 1 TABLET BY MOUTH TWICE A DAY 180 tablet 2   losartan  (COZAAR ) 100 MG tablet TAKE 1 TABLET BY MOUTH EVERY DAY 90 tablet 3   metoprolol  succinate (TOPROL -XL) 25 MG 24 hr tablet TAKE 1 TABLET (25 MG TOTAL) BY MOUTH DAILY. 90 tablet 2   nitroGLYCERIN  (NITROSTAT ) 0.4 MG SL tablet DISSOLVE 1 TABLET UNDER THE TONGUE EVERY 5 MINUTES AS NEEDED FOR CHEST PAIN. IF YOU REQUIRE MORE THAN TWO TABLETS FIVE MINUTES APART GO TO THE NEAREST ER VIA EMS. 100 tablet 1   omeprazole (PRILOSEC) 20 MG capsule Take 20 mg by mouth 2 (two) times daily before a meal.     No facility-administered medications prior to visit.    PAST MEDICAL HISTORY: Past Medical History:  Diagnosis Date   Allergy    seasonal allergies.    Anxiety    Arthritis    Neck   Atrial fibrillation (HCC)    Breast cancer (  HCC)    Hypercholesteremia    Hypertension    OSA on CPAP 12/2019   Osteopenia    Personal history of radiation therapy     PAST SURGICAL HISTORY: Past Surgical History:  Procedure Laterality Date   APPENDECTOMY     age 78   ATRIAL FIBRILLATION ABLATION N/A 04/11/2024   Procedure: ATRIAL FIBRILLATION ABLATION;  Surgeon: Kennyth Chew, MD;  Location: [facility] INVASIVE CV LAB;  Service: Cardiovascular;  Laterality: N/A;   BREAST LUMPECTOMY Right 08/03/2018   BREAST LUMPECTOMY WITH RADIOACTIVE SEED AND SENTINEL LYMPH NODE BIOPSY Right 08/03/2018   Procedure: RIGHT BREAST LUMPECTOMY WITH RADIOACTIVE SEED AND SENTINEL LYMPH NODE BIOPSY WITH BLUE DYE INJECTION;  Surgeon: Belinda Cough, MD;   Location: [facility] OR;  Service: General;  Laterality: Right;   bunionectmy     1986   CARDIOVERSION N/A 09/03/2020   Procedure: CARDIOVERSION;  Surgeon: Michele Richardson, DO;  Location: [facility] ENDOSCOPY;  Service: Cardiovascular;  Laterality: N/A;   CARDIOVERSION N/A 07/22/2021   Procedure: CARDIOVERSION;  Surgeon: Michele Richardson, DO;  Location: [facility] ENDOSCOPY;  Service: Cardiovascular;  Laterality: N/A;   CHOLECYSTECTOMY     age 37   TONSILLECTOMY      FAMILY HISTORY: Family History  Problem Relation Age of Onset   Hypertension Mother    Osteoporosis Mother    Atrial fibrillation Mother    Heart failure Mother    Colon cancer Father    Coronary artery disease Father    AAA (abdominal aortic aneurysm) Father    Breast cancer Maternal Aunt    Atrial fibrillation Brother    Atrial fibrillation Brother    Sleep apnea Neg Hx    BRCA 1/2 Neg Hx     SOCIAL HISTORY: Social History   Socioeconomic History   Marital status: Married    Spouse name: Not on file   Number of children: 3   Years of education: Not on file   Highest education level: Not on file  Occupational History   Not on file  Tobacco Use   Smoking status: Never   Smokeless tobacco: Never  Vaping Use   Vaping status: Never Used  Substance and Sexual Activity   Alcohol  use: Yes    Comment: occasionally glass if wine   Drug use: Never   Sexual activity: Not on file  Other Topics Concern   Not on file  Social History Narrative   6 grandchildren   Social Drivers of Corporate Investment Banker Strain: Not on file  Food Insecurity: Not on file  Transportation Needs: No Transportation Needs (07/31/2018)   PRAPARE - Transportation    Lack of Transportation (Medical): No    Lack of Transportation (Non-Medical): No  Physical Activity: Not on file  Stress: Not on file  Social Connections: Not on file  Intimate Partner Violence: Not At Risk (07/31/2018)   Humiliation, Afraid, Rape, and Kick questionnaire    Fear of Current or  Ex-Partner: No    Emotionally Abused: No    Physically Abused: No    Sexually Abused: No      PHYSICAL EXAM Generalized: Well developed, in no acute distress   Neurological examination  Mentation: Alert oriented to time, place, history taking. Follows all commands speech and language fluent Cranial nerve II-XII: Facial symmetry noted.   DIAGNOSTIC DATA (LABS, IMAGING, TESTING) - I reviewed patient records, labs, notes, testing and imaging myself where available.  Lab Results  Component Value Date   WBC 6.2 03/14/2024   HGB  13.7 03/14/2024   HCT 42.1 03/14/2024   MCV 90 03/14/2024   PLT 218 03/14/2024      Component Value Date/Time   NA 139 03/14/2024 0950   K 3.9 03/14/2024 0950   CL 101 03/14/2024 0950   CO2 21 03/14/2024 0950   GLUCOSE 79 03/14/2024 0950   GLUCOSE 149 (H) 09/30/2022 1615   BUN 9 03/14/2024 0950   CREATININE 0.83 03/14/2024 0950   CALCIUM 9.6 03/14/2024 0950   PROT 7.5 09/30/2022 1615   PROT 7.1 05/21/2021 1553   ALBUMIN 4.5 09/30/2022 1615   ALBUMIN 5.0 (H) 05/21/2021 1553   AST 21 09/30/2022 1615   ALT 21 09/30/2022 1615   ALKPHOS 44 09/30/2022 1615   BILITOT 0.8 09/30/2022 1615   BILITOT 0.5 05/21/2021 1553   GFRNONAA 55 (L) 09/30/2022 1615   GFRAA 73 08/28/2020 1200     ASSESSMENT AND PLAN 83 y.o. year old female  has a past medical history of Allergy, Anxiety, Arthritis, Atrial fibrillation (HCC), Breast cancer (HCC), Hypercholesteremia, Hypertension, OSA on CPAP (12/2019), Osteopenia, and Personal history of radiation therapy. here with:  OSA on CPAP  CPAP compliance excellent Residual AHI is good Encouraged patient to continue using CPAP nightly and > 4 hours each night Advised that we could do a mask refitting or she could try chinstrap.  She deferred.  Advised that she would call if she changes her mind. F/U in 1 year or sooner if needed  Orders Placed This Encounter  Procedures   For home use only DME continuous positive  airway pressure (CPAP)    Refill supplies as needed for the next year    Length of Need:   12 Months    Patient has OSA or probable OSA:   Yes    Is the patient currently using CPAP in the home:   Yes    Settings:   Other see comments    CPAP supplies needed:   Mask, headgear, cushions, filters, heated tubing and water chamber      Duwaine Russell, MSN, NP-C 06/12/2024, 12:58 PM [facility] [address] [place], [place] [address] [phone]

## 2024-06-29 ENCOUNTER — Other Ambulatory Visit: Payer: Self-pay | Admitting: Hematology and Oncology

## 2024-06-29 DIAGNOSIS — Z1231 Encounter for screening mammogram for malignant neoplasm of breast: Secondary | ICD-10-CM

## 2024-07-13 ENCOUNTER — Ambulatory Visit: Admitting: Pulmonary Disease

## 2024-07-13 NOTE — Progress Notes (Signed)
 " Electrophysiology Office Note:   Date:  07/16/2024  ID:  Kelly Blackwell, DOB May 08, 1941, MRN [id number]  Primary Cardiologist: Madonna Large, DO Primary Heart Failure: None Electrophysiologist: Fonda Kitty, MD      History of Present Illness:   Kelly Blackwell is a 83 y.o. female with h/o AF, HTN, HLD, OSA on CPAP, R breast CA seen today for routine electrophysiology follow-up s/p AF Ablation.  Since last being seen in our clinic the patient reports she is in AF.  She states she was in rhythm for a short time after the ablation but went back to AF.  She is not aware of her AF and does not have symptoms. She reports she walks 30 minutes per day. She does not have shortness of breath with exertion.   She denies chest pain, palpitations, dyspnea, PND, orthopnea, nausea, vomiting, dizziness, syncope, edema, weight gain, or early satiety.    Review of systems complete and found to be negative unless listed in HPI.   EP Information / Studies Reviewed:    EKG is ordered today. Personal review as below.  EKG Interpretation Date/Time:  Monday July 16 2024 10:46:15 EST Ventricular Rate:  62 PR Interval:    QRS Duration:  94 QT Interval:  412 QTC Calculation: 418 R Axis:   64  Text Interpretation: Coarse Atrial fibrillation Confirmed by Aniceto Jarvis ([id number]) on 07/16/2024 10:58:58 AM    Arrhythmia / AAD / Pertinent EP Studies AF  DCCV 09/03/20, 07/22/21 Flecainide  05/2021 > 07/16/24 (stopped as pt elected to not pursue further attempts at rhythm control) EPS 04/11/24 > PVI + PW ablation    Risk Assessment/Calculations:    CHA2DS2-VASc Score = 4   This indicates a 4.8% annual risk of stroke. The patient's score is based upon: CHF History: 0 HTN History: 1 Diabetes History: 0 Stroke History: 0 Vascular Disease History: 0 Age Score: 2 Gender Score: 1             Physical Exam:   VS:  BP 134/86   Pulse 62   Ht 5' 1 (1.549 m)   Wt 114 lb 6.4 oz (51.9 kg)   SpO2 98%    BMI 21.62 kg/m    Wt Readings from Last 3 Encounters:  07/16/24 114 lb 6.4 oz (51.9 kg)  05/09/24 119 lb 3.2 oz (54.1 kg)  04/11/24 115 lb (52.2 kg)     GEN: pleasant, well nourished, well developed in no acute distress NECK: No JVD; No carotid bruits CARDIAC: Irregularly irregular rate and rhythm, no murmurs, rubs, gallops RESPIRATORY:  Clear to auscultation without rales, wheezing or rhonchi  ABDOMEN: Soft, non-tender, non-distended EXTREMITIES:  No edema; No deformity   ASSESSMENT AND PLAN:    Persistent Atrial Fibrillation  CHA2DS2-VASc 4, s/p PVI +PW ablation 03/2024  -OAC for stroke prophylaxis  -Toprol  25 mg daily -EKG with coarse AF  -discussed pursuing DCCV with increased dose of flecainide  for brief period.  She states she has thought about it a lot and has to care for her husband who is in a facility. She wants to put AF behind her and be able to focus on other things. Discussed a time limited trial of increased dose flecainide  alone and she elects not to pursue.  She does not want DCCV or flecainide  at this time.  -stop Flecainide   -will consider her permanent AF at this point -discussed importance of remaining on anticoagulation for stroke protection  Secondary Hypercoagulable State  -continue Eliquis  2.5mg  BID,  dose reviewed and appropriate by age / wt   Hypertension  -well controlled on current regimen    OSA  -CPAP compliance encouraged    Follow up with Dr. Michele as planned.  She does not require further EP follow up at this time.   Signed, Daphne Barrack, NP-C, AGACNP-BC [facility] - Electrophysiology  07/16/2024, 10:59 AM  "

## 2024-07-16 ENCOUNTER — Ambulatory Visit: Attending: Pulmonary Disease | Admitting: Pulmonary Disease

## 2024-07-16 ENCOUNTER — Encounter: Payer: Self-pay | Admitting: Pulmonary Disease

## 2024-07-16 VITALS — BP 134/86 | HR 62 | Ht 61.0 in | Wt 114.4 lb

## 2024-07-16 DIAGNOSIS — D6869 Other thrombophilia: Secondary | ICD-10-CM | POA: Diagnosis not present

## 2024-07-16 DIAGNOSIS — I10 Essential (primary) hypertension: Secondary | ICD-10-CM | POA: Diagnosis not present

## 2024-07-16 DIAGNOSIS — I4819 Other persistent atrial fibrillation: Secondary | ICD-10-CM

## 2024-07-16 DIAGNOSIS — G4733 Obstructive sleep apnea (adult) (pediatric): Secondary | ICD-10-CM

## 2024-07-16 NOTE — Patient Instructions (Signed)
 Medication Instructions:  Stop Flecainide .  *If you need a refill on your cardiac medications before your next appointment, please call your pharmacy*  Lab Work: None  If you have labs (blood work) drawn today and your tests are completely normal, you will receive your results only by: MyChart Message (if you have MyChart) OR A paper copy in the mail If you have any lab test that is abnormal or we need to change your treatment, we will call you to review the results.  Testing/Procedures: None   Follow-Up: At [facility], you and your health needs are our priority.  As part of our continuing mission to provide you with exceptional heart care, our providers are all part of one team.  This team includes your primary Cardiologist (physician) and Advanced Practice Providers or APPs (Physician Assistants and Nurse Practitioners) who all work together to provide you with the care you need, when you need it.  Your next appointment:    As scheduled.   Provider:   Madonna Large, DO     Other Instructions None

## 2024-08-13 ENCOUNTER — Ambulatory Visit

## 2024-08-23 ENCOUNTER — Ambulatory Visit
Admission: RE | Admit: 2024-08-23 | Discharge: 2024-08-23 | Disposition: A | Source: Ambulatory Visit | Attending: Hematology and Oncology

## 2024-08-23 DIAGNOSIS — Z1231 Encounter for screening mammogram for malignant neoplasm of breast: Secondary | ICD-10-CM

## 2025-06-04 ENCOUNTER — Telehealth: Admitting: Adult Health
